# Patient Record
Sex: Female | Born: 1935 | Race: White | Hispanic: No | Marital: Married | Smoking: Never smoker
Health system: Southern US, Community
[De-identification: ages and names within clinical notes are randomized; demographics above are authoritative.]

## PROBLEM LIST (undated history)

## (undated) DIAGNOSIS — H269 Unspecified cataract: Secondary | ICD-10-CM

## (undated) DIAGNOSIS — C801 Malignant (primary) neoplasm, unspecified: Secondary | ICD-10-CM

## (undated) DIAGNOSIS — E785 Hyperlipidemia, unspecified: Secondary | ICD-10-CM

## (undated) DIAGNOSIS — Z860100 Personal history of colon polyps, unspecified: Secondary | ICD-10-CM

## (undated) DIAGNOSIS — M810 Age-related osteoporosis without current pathological fracture: Secondary | ICD-10-CM

## (undated) HISTORY — PX: EYE SURGERY: SHX253

## (undated) HISTORY — DX: Hyperlipidemia, unspecified: E78.5

## (undated) HISTORY — DX: Personal history of colonic polyps: Z86.010

## (undated) HISTORY — DX: Personal history of colon polyps, unspecified: Z86.0100

## (undated) HISTORY — PX: POLYPECTOMY: SHX149

## (undated) HISTORY — PX: COLONOSCOPY: SHX174

## (undated) HISTORY — DX: Age-related osteoporosis without current pathological fracture: M81.0

## (undated) HISTORY — DX: Unspecified cataract: H26.9

---

## 1998-08-01 ENCOUNTER — Other Ambulatory Visit: Admission: RE | Admit: 1998-08-01 | Discharge: 1998-08-01 | Payer: Self-pay | Admitting: Internal Medicine

## 1999-10-13 ENCOUNTER — Other Ambulatory Visit: Admission: RE | Admit: 1999-10-13 | Discharge: 1999-10-13 | Payer: Self-pay | Admitting: Internal Medicine

## 2000-01-12 ENCOUNTER — Other Ambulatory Visit: Admission: RE | Admit: 2000-01-12 | Discharge: 2000-01-12 | Payer: Self-pay | Admitting: Gastroenterology

## 2000-01-12 ENCOUNTER — Encounter (INDEPENDENT_AMBULATORY_CARE_PROVIDER_SITE_OTHER): Payer: Self-pay | Admitting: Specialist

## 2001-02-03 ENCOUNTER — Other Ambulatory Visit: Admission: RE | Admit: 2001-02-03 | Discharge: 2001-02-03 | Payer: Self-pay | Admitting: *Deleted

## 2002-02-13 ENCOUNTER — Other Ambulatory Visit: Admission: RE | Admit: 2002-02-13 | Discharge: 2002-02-13 | Payer: Self-pay | Admitting: Internal Medicine

## 2003-03-12 ENCOUNTER — Other Ambulatory Visit: Admission: RE | Admit: 2003-03-12 | Discharge: 2003-03-12 | Payer: Self-pay | Admitting: Internal Medicine

## 2003-04-02 ENCOUNTER — Ambulatory Visit (HOSPITAL_COMMUNITY): Admission: RE | Admit: 2003-04-02 | Discharge: 2003-04-02 | Payer: Self-pay | Admitting: Cardiology

## 2003-04-02 IMAGING — NM NM MYOCAR PERF EJECTION FRACTION
8 series · 48 of 48 positions shown · non-contrast
Comparison: None.

CLINICAL DATA: 68-year-old female with chest pain.
 MYOCARDIAL PERFUSION WITH SPECT ? 04/02/03
TECHNIQUE: Iechnetium-TTm sestamibi 10.0 mCi was injected at rest.  After exercise stress, an additional 30.0 mCi dose of the same radiopharmaceutical was administered. The patient achieved greater than 85% of the maximum predicted heart rate for age prior to injection of the stress dose.

[Series 1: cr cardiolite low dose · 6.8mm · 6.79mm/px · 6 of 17 frames shown (1 of 8)]
[frame 2/17]
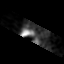
[frame 4/17]
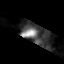
[frame 7/17]
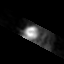
[frame 10/17]
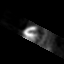
[frame 13/17]
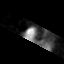
[frame 16/17]
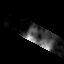

[Series 1: cr cardiolite low dose · 6.8mm · 6.79mm/px · 6 of 17 frames shown (2 of 8)]
[frame 2/17]
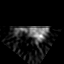
[frame 4/17]
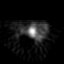
[frame 7/17]
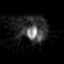
[frame 10/17]
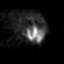
[frame 13/17]
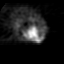
[frame 16/17]
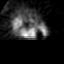

[Series 1: cr cardiolite low dose · 6.8mm · 6.79mm/px · 6 of 17 frames shown (3 of 8)]
[frame 2/17]
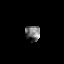
[frame 4/17]
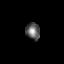
[frame 7/17]
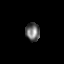
[frame 10/17]
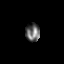
[frame 13/17]
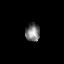
[frame 16/17]
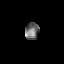

[Series 1: cr cardiolite low dose · 6.8mm · 6.79mm/px · 6 of 21 frames shown (4 of 8)]
[frame 2/21]
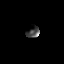
[frame 6/21]
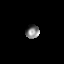
[frame 9/21]
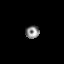
[frame 13/21]
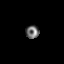
[frame 16/21]
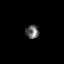
[frame 20/21]
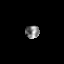

[Series 1: cr cardiolite low dose · 6.8mm · 6.79mm/px · 6 of 17 frames shown (5 of 8)]
[frame 2/17]
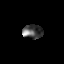
[frame 4/17]
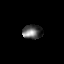
[frame 7/17]
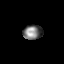
[frame 10/17]
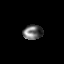
[frame 13/17]
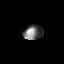
[frame 16/17]
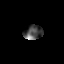

[Series 1: cr cardiolite low dose · 6.79mm/px · 6 of 64 frames shown (6 of 8)]
[frame 6/64]
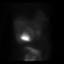
[frame 16/64]
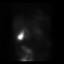
[frame 27/64]
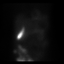
[frame 38/64]
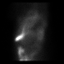
[frame 48/64]
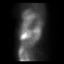
[frame 59/64]
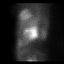

[Series 1: cr cardiolite low dose · 6.79mm/px · 6 of 64 frames shown (7 of 8)]
[frame 6/64]
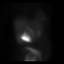
[frame 16/64]
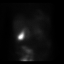
[frame 27/64]
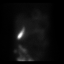
[frame 38/64]
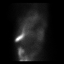
[frame 48/64]
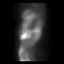
[frame 59/64]
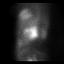

[Series 1: cr cardiolite low dose · 6.8mm · 6.79mm/px · 6 of 21 frames shown (8 of 8)]
[frame 2/21]
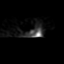
[frame 6/21]
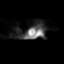
[frame 9/21]
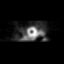
[frame 13/21]
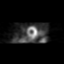
[frame 16/21]
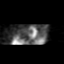
[frame 20/21]
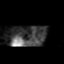

[48 of 48 positions shown; findings below may reference images not displayed]

FINDINGS: SPECT images show no fixed or reversible perfusion defects within the left ventricular myocardium.
 NUCLEAR MEDICINE WALL MOTION STUDY
 Images obtained with cardiac gating and displayed with surface rendering show no segmental wall motion abnormality.  
 NUCLEAR MEDICINE EJECTION FRACTION STUDY
 The left ventricular end-diastolic volume is 71 cc.  The left ventricular end-systolic volume is 23 cc.  The derived left ventricular ejection fraction is 67%.
IMPRESSION: No evidence for left ventricular myocardial infarct or ischemia.
 Normal left ventricular ejection fraction.

## 2004-04-16 ENCOUNTER — Other Ambulatory Visit: Admission: RE | Admit: 2004-04-16 | Discharge: 2004-04-16 | Payer: Self-pay | Admitting: Internal Medicine

## 2004-06-22 ENCOUNTER — Ambulatory Visit: Payer: Self-pay | Admitting: Gastroenterology

## 2004-06-25 ENCOUNTER — Ambulatory Visit: Payer: Self-pay | Admitting: Gastroenterology

## 2004-07-02 ENCOUNTER — Ambulatory Visit: Payer: Self-pay | Admitting: Gastroenterology

## 2004-08-07 ENCOUNTER — Ambulatory Visit: Payer: Self-pay | Admitting: Gastroenterology

## 2004-09-28 ENCOUNTER — Ambulatory Visit: Payer: Self-pay | Admitting: Gastroenterology

## 2005-05-10 ENCOUNTER — Other Ambulatory Visit: Admission: RE | Admit: 2005-05-10 | Discharge: 2005-05-10 | Payer: Self-pay | Admitting: Internal Medicine

## 2006-03-28 ENCOUNTER — Ambulatory Visit: Payer: Self-pay | Admitting: Emergency Medicine

## 2006-03-30 ENCOUNTER — Ambulatory Visit: Payer: Self-pay | Admitting: *Deleted

## 2006-03-30 IMAGING — CT CT CHEST W/ CM
2 of 5 series · 13 of 36 positions shown, 16 images · IV contrast (APPLIED)
Comparison: none

HISTORY: Chest discomfort, cough, congestion, right middle lobe nodule

[Series 2: chest_routine 5.0 b40f st · axial · 0.61mm/px · z∈[-291,-41]mm · 10 of 62 slices shown, 13 images]
[im 6/62  mediastinal]
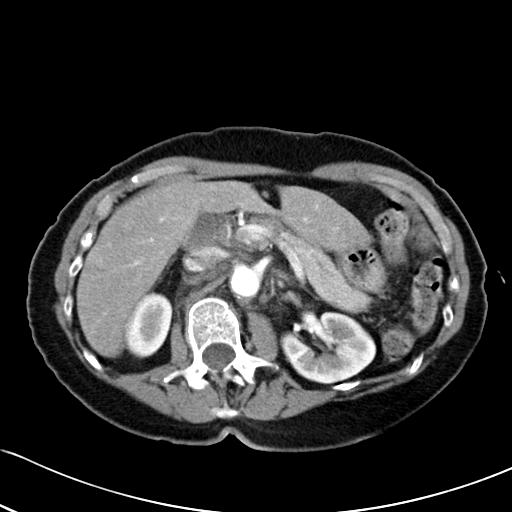
[im 6/62  lung]
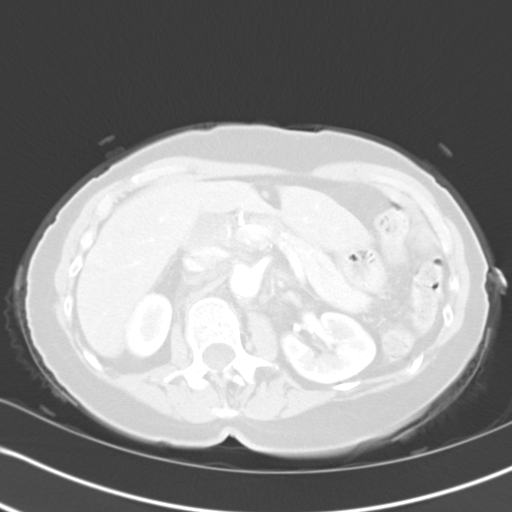
[im 12/62  lung]
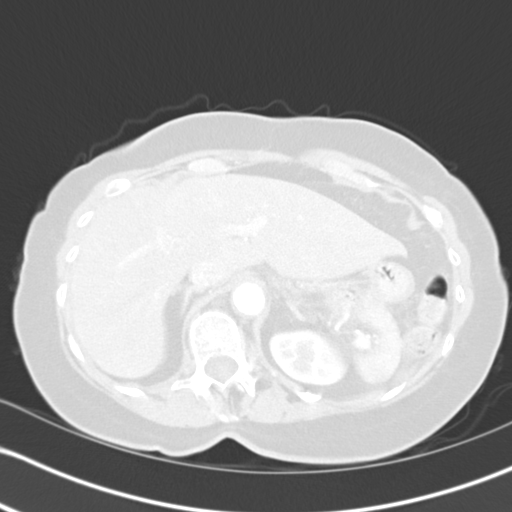
[im 17/62  lung]
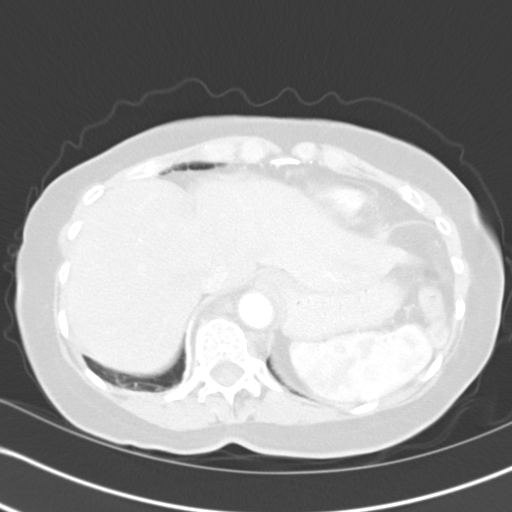
[im 23/62  lung]
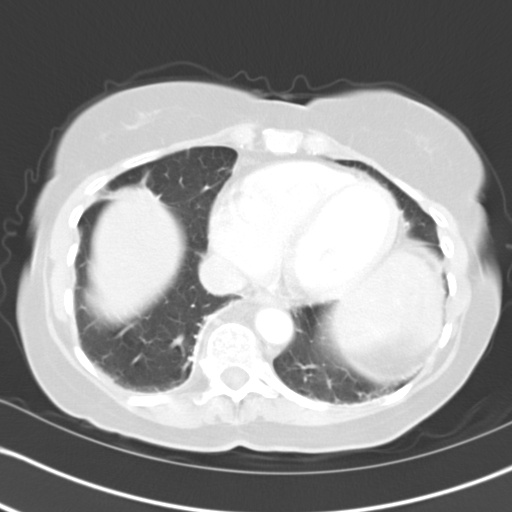
[im 28/62  mediastinal]
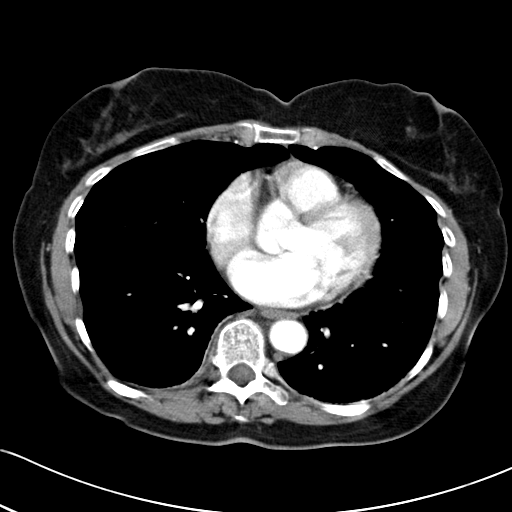
[im 28/62  lung]
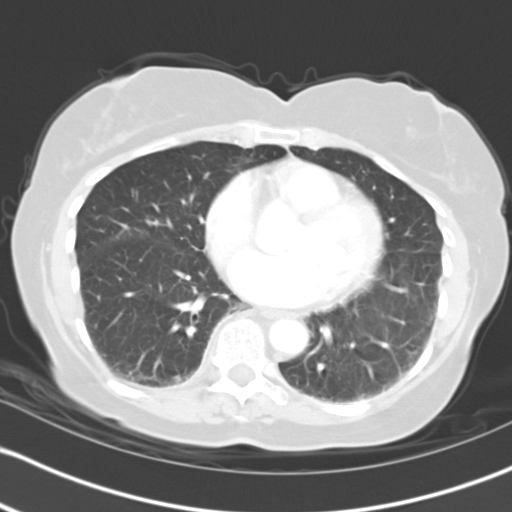
[im 34/62  lung]
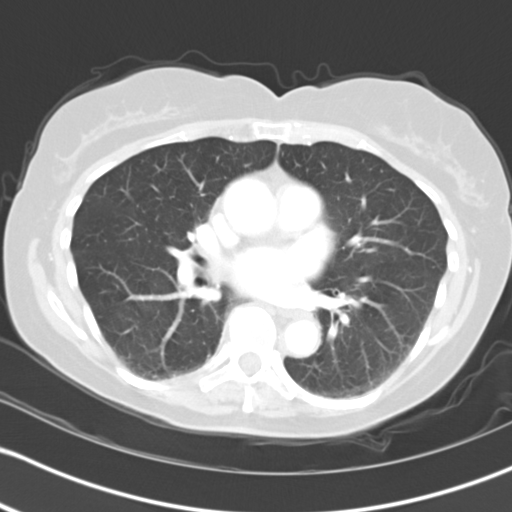
[im 39/62  lung]
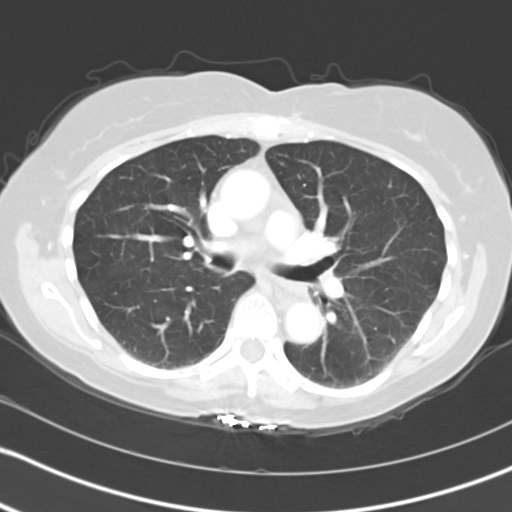
[im 45/62  lung]
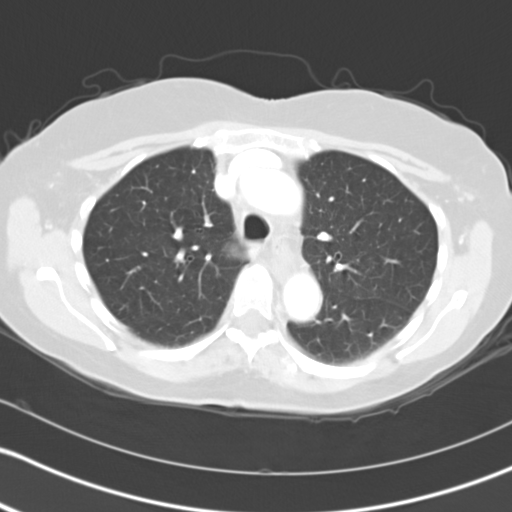
[im 50/62  mediastinal]
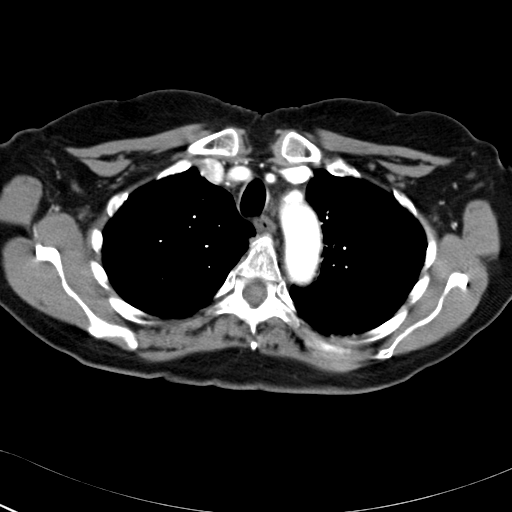
[im 50/62  lung]
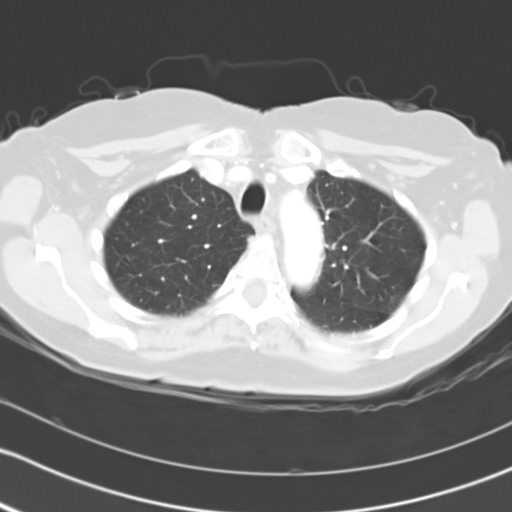
[im 56/62  lung]
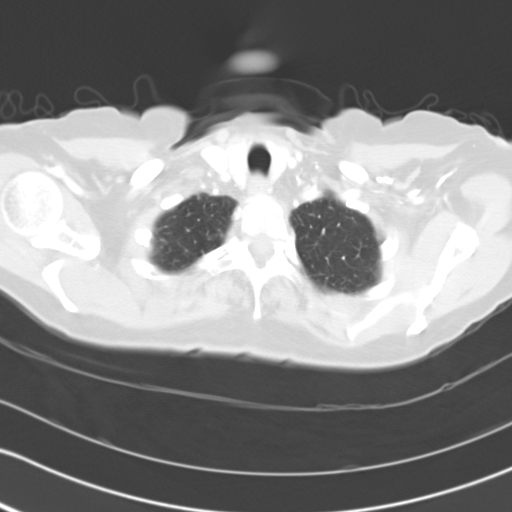

[Series 602: <mpr range> · coronal · 0.61mm/px · 3 of 39 slices shown]
[im 8/39  lung]
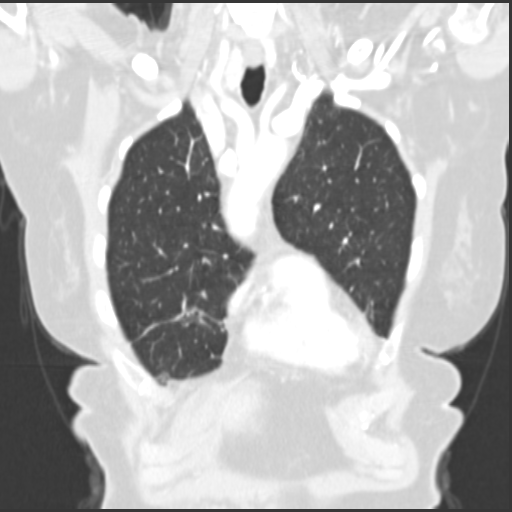
[im 16/39  lung]
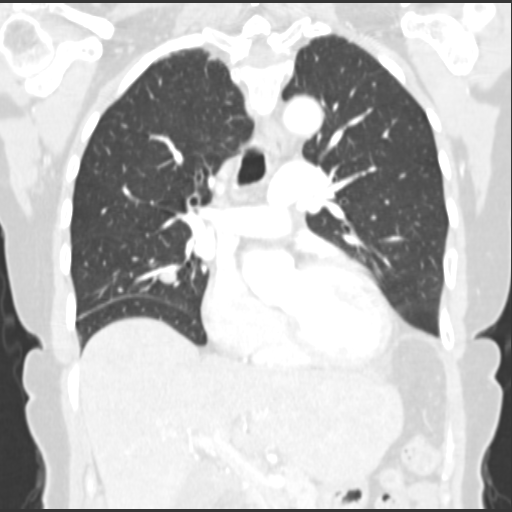
[im 23/39  lung]
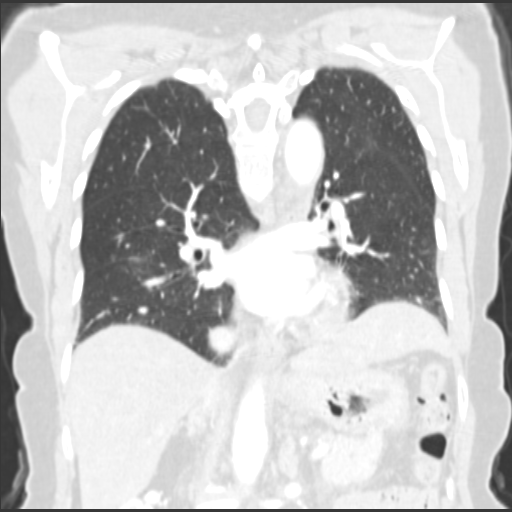

[13 of 36 positions shown; findings below may reference images not displayed]

CT CHEST WITH CONTRAST:

Multidetector helical CT chest performed following 80 cc Umnipaque-4KK.
Sagittal and coronal images reconstructed from axial data set.
Correlation made with outside radiographs 01/31/2006.

Thoracic vascular structures patent.
No mediastinal, hilar, or axillary adenopathy.
Upper normal sized retrocrural lymph nodes noted, 11 x 6 mm image 45 and 11 x 8
mm image 41.
Enlarged peripancreatic lymph node in right upper quadrant 1.9 x 1.3 cm image
58.
Lobulated soft tissue nodule right middle lobe, 12 x 12 x 9 mm, image 33,
noncalcified.
Malignancy not excluded.
Minimal residual atelectasis right middle lobe and right lower lobe.
Lungs otherwise clear.
No definite endobronchial lesion.
Bones unremarkable.
IMPRESSION: 12 x 12 x 9 mm diameter right middle lobe nodule, cannot exclude lung
malignancy, recommend tissue diagnosis.
Minimal residual atelectasis in right middle and right lower lobe.
Single mildly enlarged peripancreatic lymph node with a few upper normal size
retrocrural nodes, can assess significance on followup exam.

## 2006-04-05 ENCOUNTER — Ambulatory Visit (HOSPITAL_COMMUNITY): Admission: RE | Admit: 2006-04-05 | Discharge: 2006-04-05 | Payer: Self-pay | Admitting: Thoracic Surgery

## 2006-04-05 IMAGING — CT NM PET TUM IMG SKULL BASE T - THIGH
3 series · 25 of 25 positions shown · IV contrast (350 OM)
Comparison: Comparison is made with chest CT of 03/30/06.

CLINICAL DATA: Right middle lobe lung nodule. No history of smoking. No known primary malignancy. 
FDG PET-CT TUMOR IMAGING (SKULL BASE TO THIGHS):

Fasting Blood Glucose:  82
TECHNIQUE: 14.7 mCi F-18 FDG were administered via left AC injection.  Full ring PET imaging was performed from the skull base through the mid-thighs 55 minutes after injection.  CT data was obtained and used for attenuation correction and anatomic localization only.  (This was not acquired as a diagnostic CT examination.)

[Series 1: pet ac · axial · 3.3mm · 4.69mm/px · z∈[-726,+0]mm · 9 of 223 slices shown]
[im 1/223]
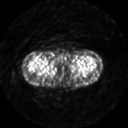
[im 28/223]
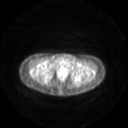
[im 56/223]
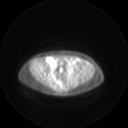
[im 84/223]
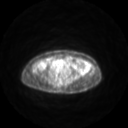
[im 112/223]
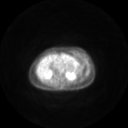
[im 139/223]
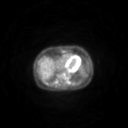
[im 167/223]
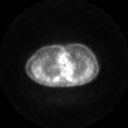
[im 195/223]
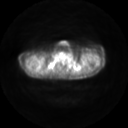
[im 223/223]
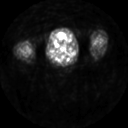

[Series 2: pet nac · axial · 3.3mm · 4.69mm/px · z∈[-726,+0]mm · 8 of 223 slices shown]
[im 1/223]
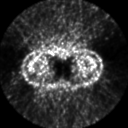
[im 32/223]
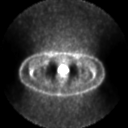
[im 64/223]
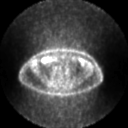
[im 96/223]
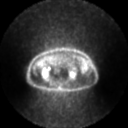
[im 127/223]
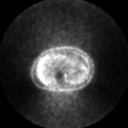
[im 159/223]
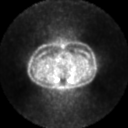
[im 191/223]
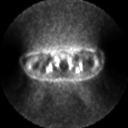
[im 223/223]
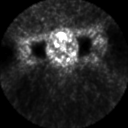

[Series 2: ct images · axial · 3.8mm · 0.98mm/px · z∈[-726,+0]mm · 8 of 223 slices shown]
[im 1/223]
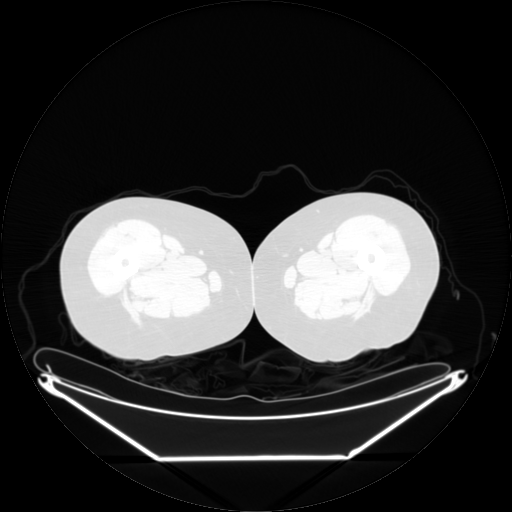
[im 32/223]
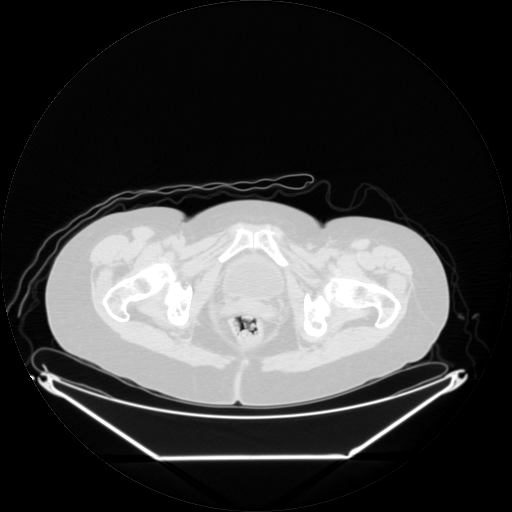
[im 64/223]
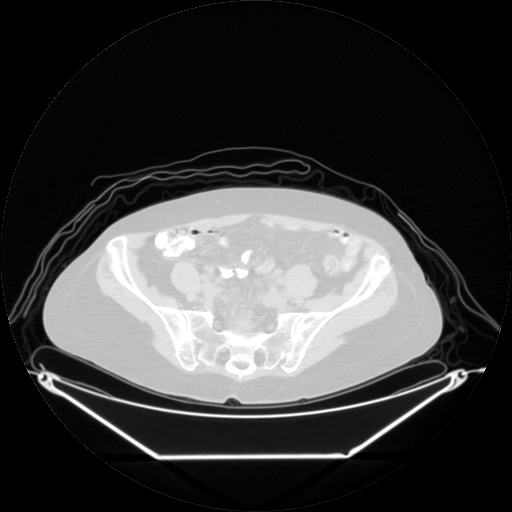
[im 96/223]
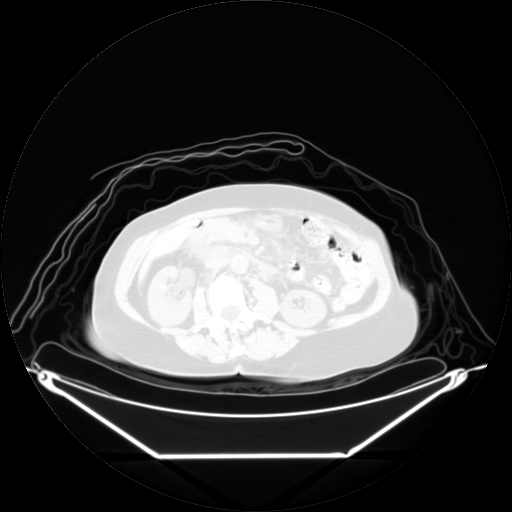
[im 127/223]
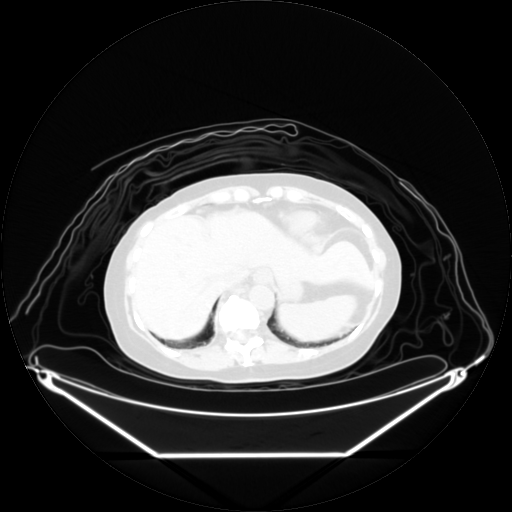
[im 159/223]
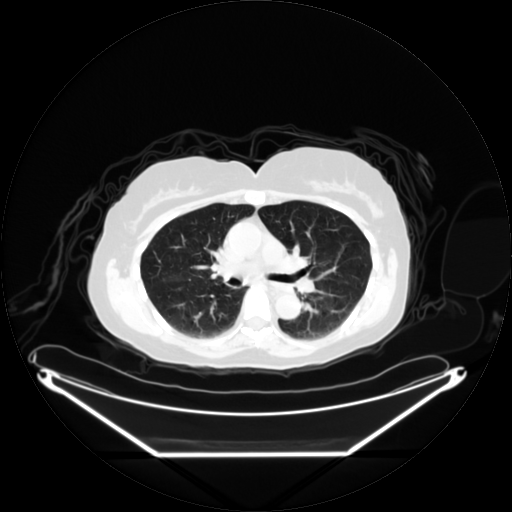
[im 191/223]
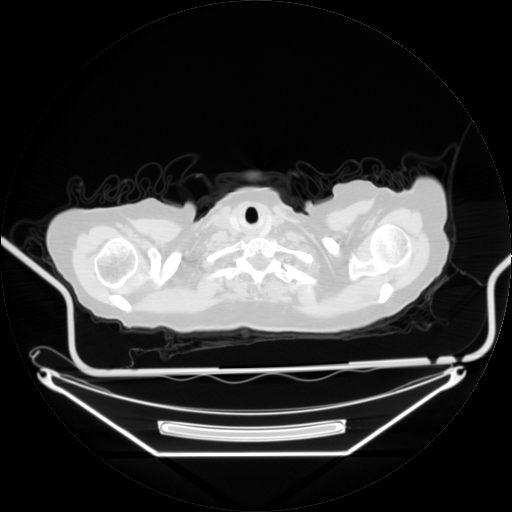
[im 223/223]
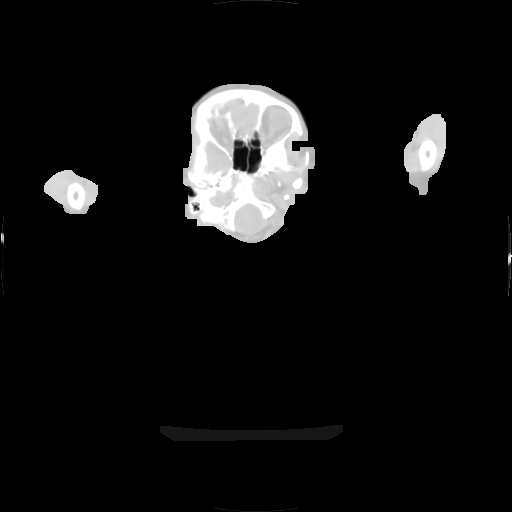

[25 of 25 positions shown; findings below may reference images not displayed]

FINDINGS: Within the left palatine tonsil region, corresponding to an area of possible soft tissue fullness versus underdistention on image 14, there is mild hypermetabolism measuring a maximum of 4.2 gm/mL.  
Throughout the neck and chest, there is extensive ?brown/hypermetabolic fat?.  This limits evaluation for hypermetabolic nodes within the neck and upper chest. 
The right middle lobe lung nodule described on the prior CT demonstrates mild hypermetabolism measuring a maximum of 2.2 gm/mL. This is slightly greater than the mediastinal blood pool.
No other hypermetabolic lung nodules.  No abnormal nodal activity within the chest.  
  In addition, the retrocrural nodes described on prior diagnostic CT are not hypermetabolic.  A focus of hypermetabolism is identified posterior to the left common carotid artery corresponding to image 45 of the CT images.  This was felt to represent focal brown/hypermetabolic fat.  There is a lymph node positioned superior and lateral to this area on image 41, which is felt separate. 
There is no abnormal activity within the abdomen. 
Corresponding to a right inguinal lymph node measuring approximately 1.1 cm on image 204 is mild hypermetabolism measuring maximally 2.5 gm/mL. This lymph node appears to maintain it?s fatty hilum (likely reactive). 
CT images performed for attenuation correction demonstrate normal adrenal glands. There is subtle increased density within the jejunal mesentery (example image 131).  There is also a small amount of free pelvic fluid. There is no evidence of adnexal mass.
IMPRESSION: 1.  Mild hypermetabolism within a right middle lobe lung nodule.  This is just below the standard cutoff for malignancy (SUV of 2.5) . Differential considerations would include an indolent bronchogenic carcinoma such as a low-grade adenocarcinoma or a reactive post-infectious/inflammatory nodule. Consider either tissue sampling or short-term follow-up with chest CT (i.e. 3 months).
2.  No evidence of thoracic or extrathoracic metastasis. 
3.  Somewhat limited exam due to extensive brown fat. 
4.  Left palatine tonsil activity and apparent soft tissue fullness could be physiologic and/or due to underdistention. Consider physical exam correlation. 
5.  A small amount of free fluid within the pelvis with increased density within the jejunal mesentery. No cause is identified.  As indicated, if the patient undergoes follow-up chest CT, abdominal CT should be considered to confirm stability or resolution. 
This case was discussed with Dr. Keerthana prior to this dictation.

## 2006-04-19 DIAGNOSIS — Z902 Acquired absence of lung [part of]: Secondary | ICD-10-CM

## 2006-04-19 HISTORY — DX: Acquired absence of lung (part of): Z90.2

## 2006-04-26 ENCOUNTER — Ambulatory Visit (HOSPITAL_COMMUNITY): Admission: RE | Admit: 2006-04-26 | Discharge: 2006-04-26 | Payer: Self-pay | Admitting: Emergency Medicine

## 2006-04-26 ENCOUNTER — Ambulatory Visit: Payer: Self-pay | Admitting: Emergency Medicine

## 2006-04-26 ENCOUNTER — Encounter (INDEPENDENT_AMBULATORY_CARE_PROVIDER_SITE_OTHER): Payer: Self-pay | Admitting: Specialist

## 2006-04-26 IMAGING — CR DG CHEST 1V PORT
1 series · 1 of 1 positions shown · non-contrast
Comparison: none

CLINICAL DATA: Status-post bronchoscopy for pulmonary nodule.  
 PORTABLE CHEST - 1 VIEW ? 04/26/06 (5756 HOURS):

[view not recorded]
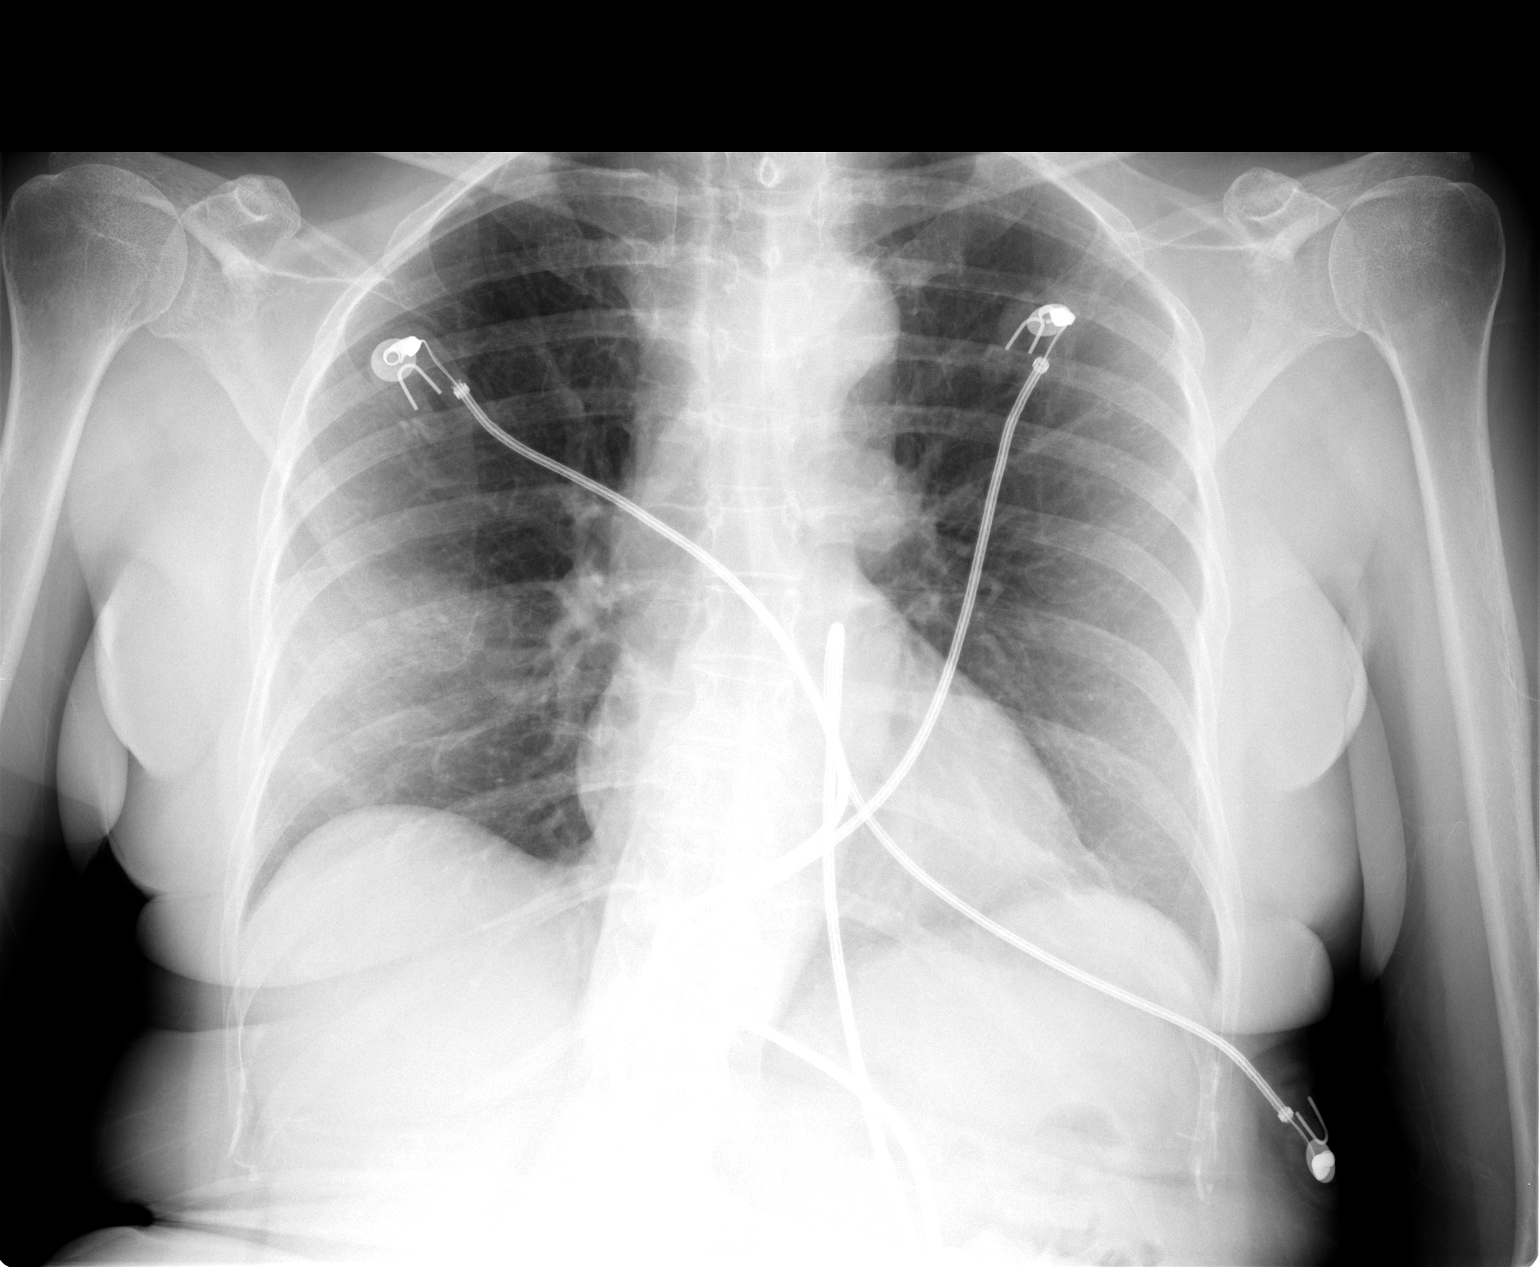

[1 of 1 positions shown; findings below may reference images not displayed]

FINDINGS: No pneumothorax post bronchoscopy.  Faint nodule is noted just inferior and lateral to the right hilum.
IMPRESSION: No pneumothorax post bronchoscopy.

## 2006-05-11 ENCOUNTER — Other Ambulatory Visit: Admission: RE | Admit: 2006-05-11 | Discharge: 2006-05-11 | Payer: Self-pay | Admitting: Internal Medicine

## 2006-05-20 HISTORY — PX: LUNG REMOVAL, PARTIAL: SHX233

## 2006-06-07 IMAGING — CR DG CHEST 2V
2 series · 2 of 2 positions shown · non-contrast
Comparison: 04/26/06.

CLINICAL DATA: Lung lesion. 
 CHEST - 2 VIEW:

[view not recorded (1 of 2)]
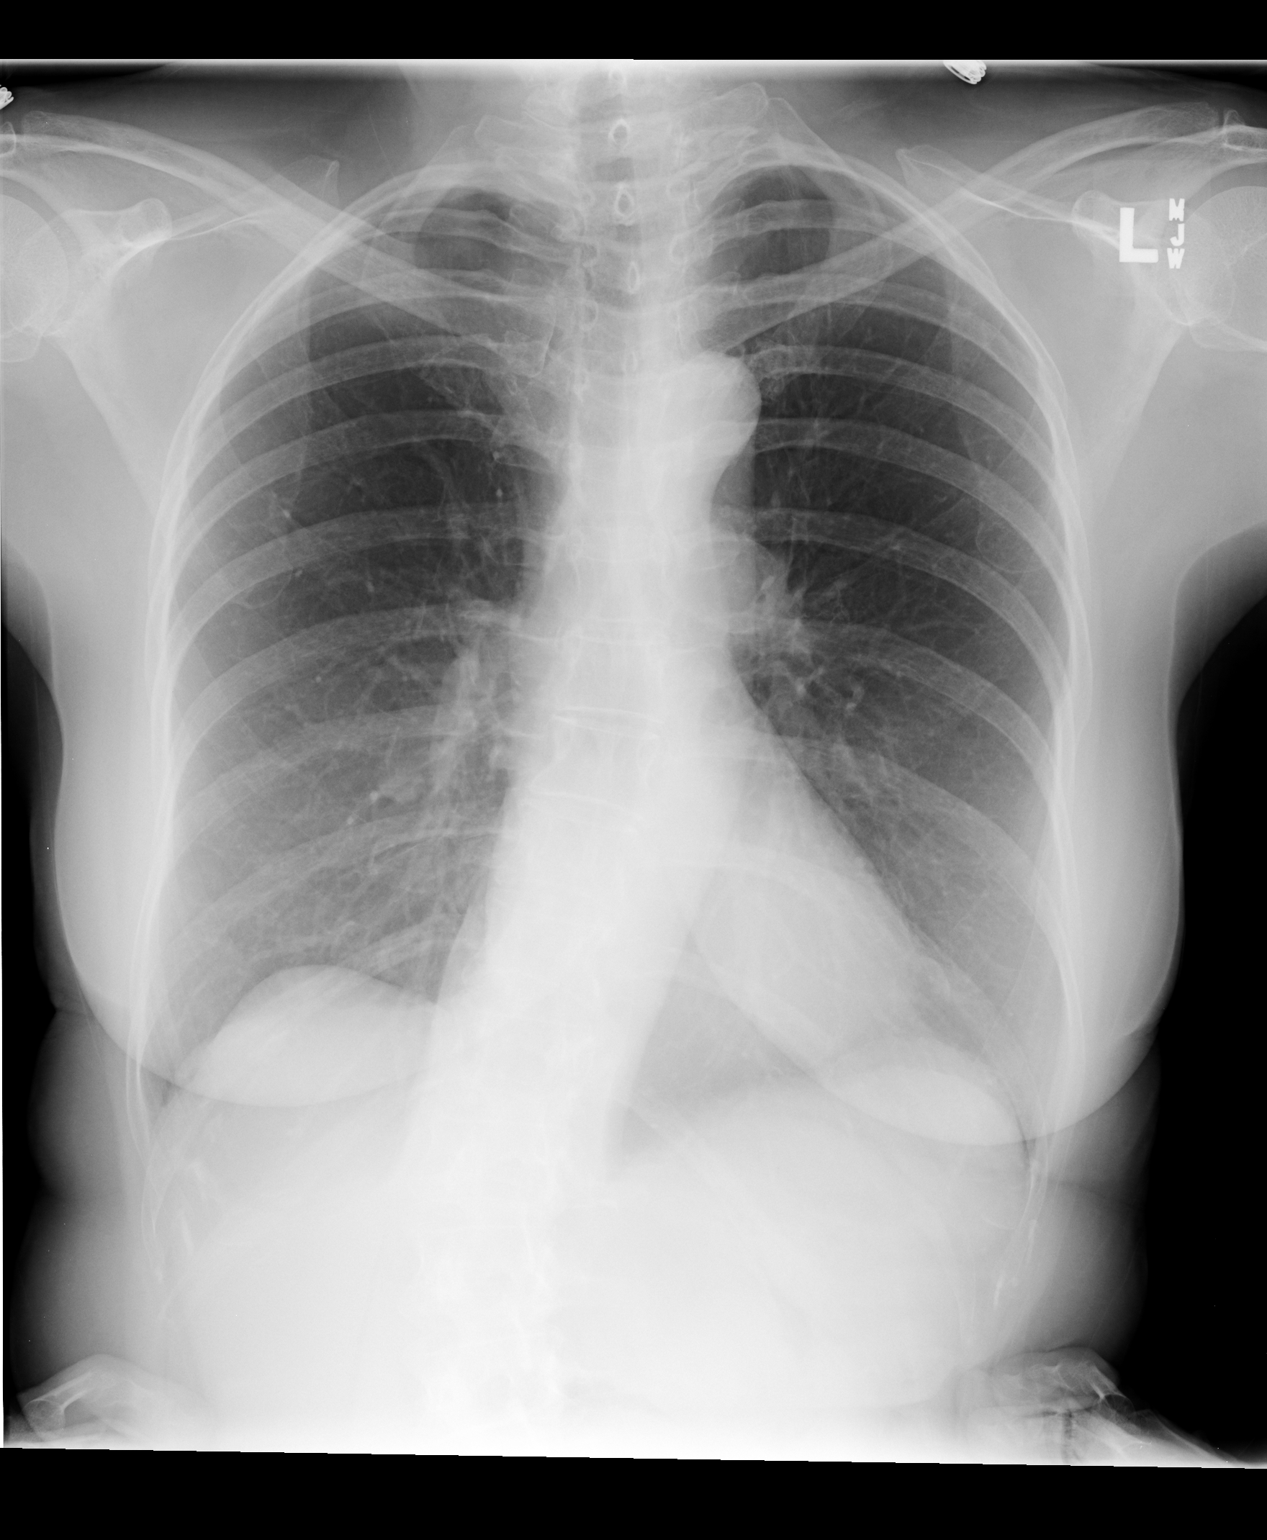

[view not recorded (2 of 2)]
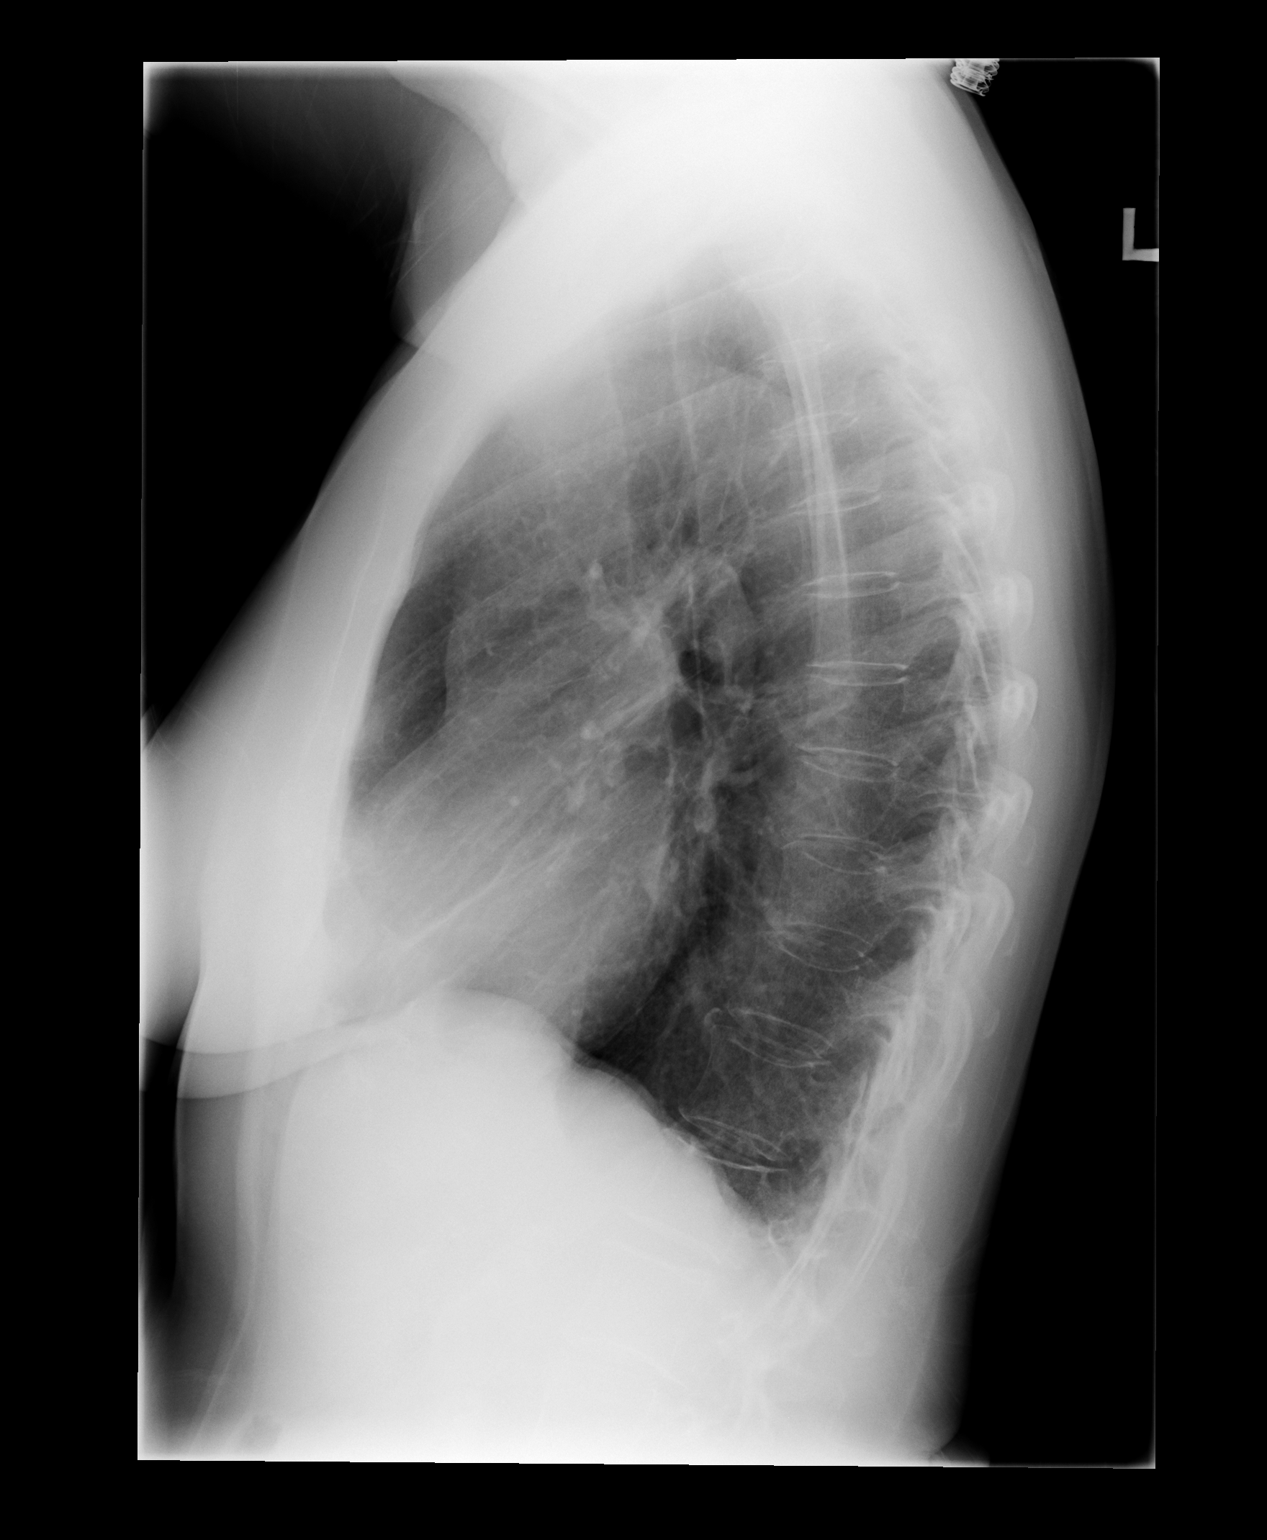

[2 of 2 positions shown; findings below may reference images not displayed]

FINDINGS: The right perihilar pulmonary nodule is again noted measuring approximately 12 mm.  
 No new lung opacities are identified. 
 The heart size is normal. No effusions or edema.
IMPRESSION: No significant change in right pulmonary nodule.

## 2006-06-09 ENCOUNTER — Ambulatory Visit: Payer: Self-pay | Admitting: Thoracic Surgery

## 2006-06-09 ENCOUNTER — Encounter (INDEPENDENT_AMBULATORY_CARE_PROVIDER_SITE_OTHER): Payer: Self-pay | Admitting: Specialist

## 2006-06-09 ENCOUNTER — Inpatient Hospital Stay (HOSPITAL_COMMUNITY): Admission: RE | Admit: 2006-06-09 | Discharge: 2006-06-13 | Payer: Self-pay | Admitting: Thoracic Surgery

## 2006-06-09 IMAGING — CR DG CHEST 1V PORT
1 series · 1 of 1 positions shown · non-contrast
Comparison: Comparison is made to preoperative exam 06/07/06.

CLINICAL DATA: Status-post VATS. Right middle lobectomy. Central line and chest tubes.
 PORTABLE CHEST - 1 VIEW ? 06/09/06 AT 6699 HOURS:

[view not recorded]
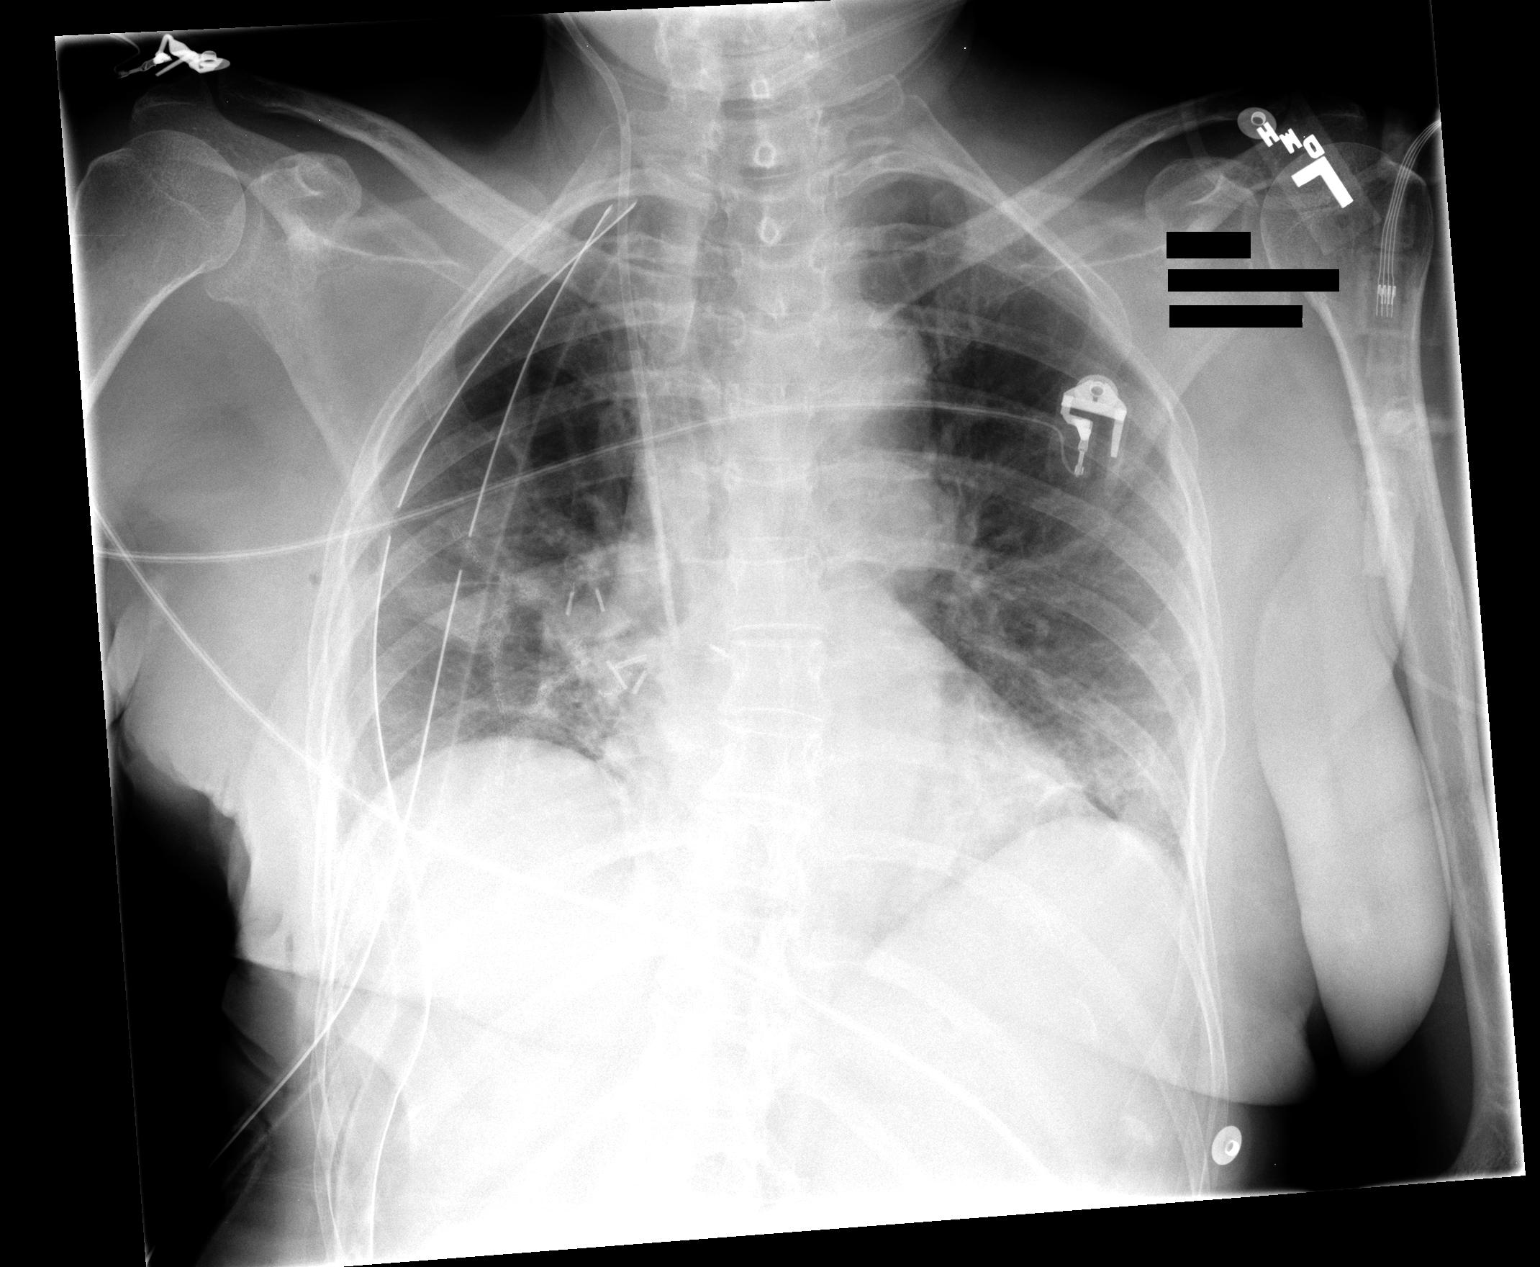

[1 of 1 positions shown; findings below may reference images not displayed]

FINDINGS: Right perihilar surgical clips and staple rows. Two right pleural chest tubes with tips at the apex.  Right jugular CVC is noted with tip in the distal SVC.  Subsegmental atelectasis lower lung zones. No pneumothorax. No airspace opacities.
IMPRESSION: Bilateral subsegmental atelectasis postoperatively. No pneumothorax. Right IJ CVC is in the distal SVC.

## 2006-06-10 IMAGING — CR DG CHEST 1V PORT
1 series · 1 of 1 positions shown · non-contrast
Comparison: 06/09/06.

CLINICAL DATA: 71-year-old female.  Lung lesion status post right thoracotomy. 
PORTABLE CHEST - 1 VIEW:

[view not recorded]
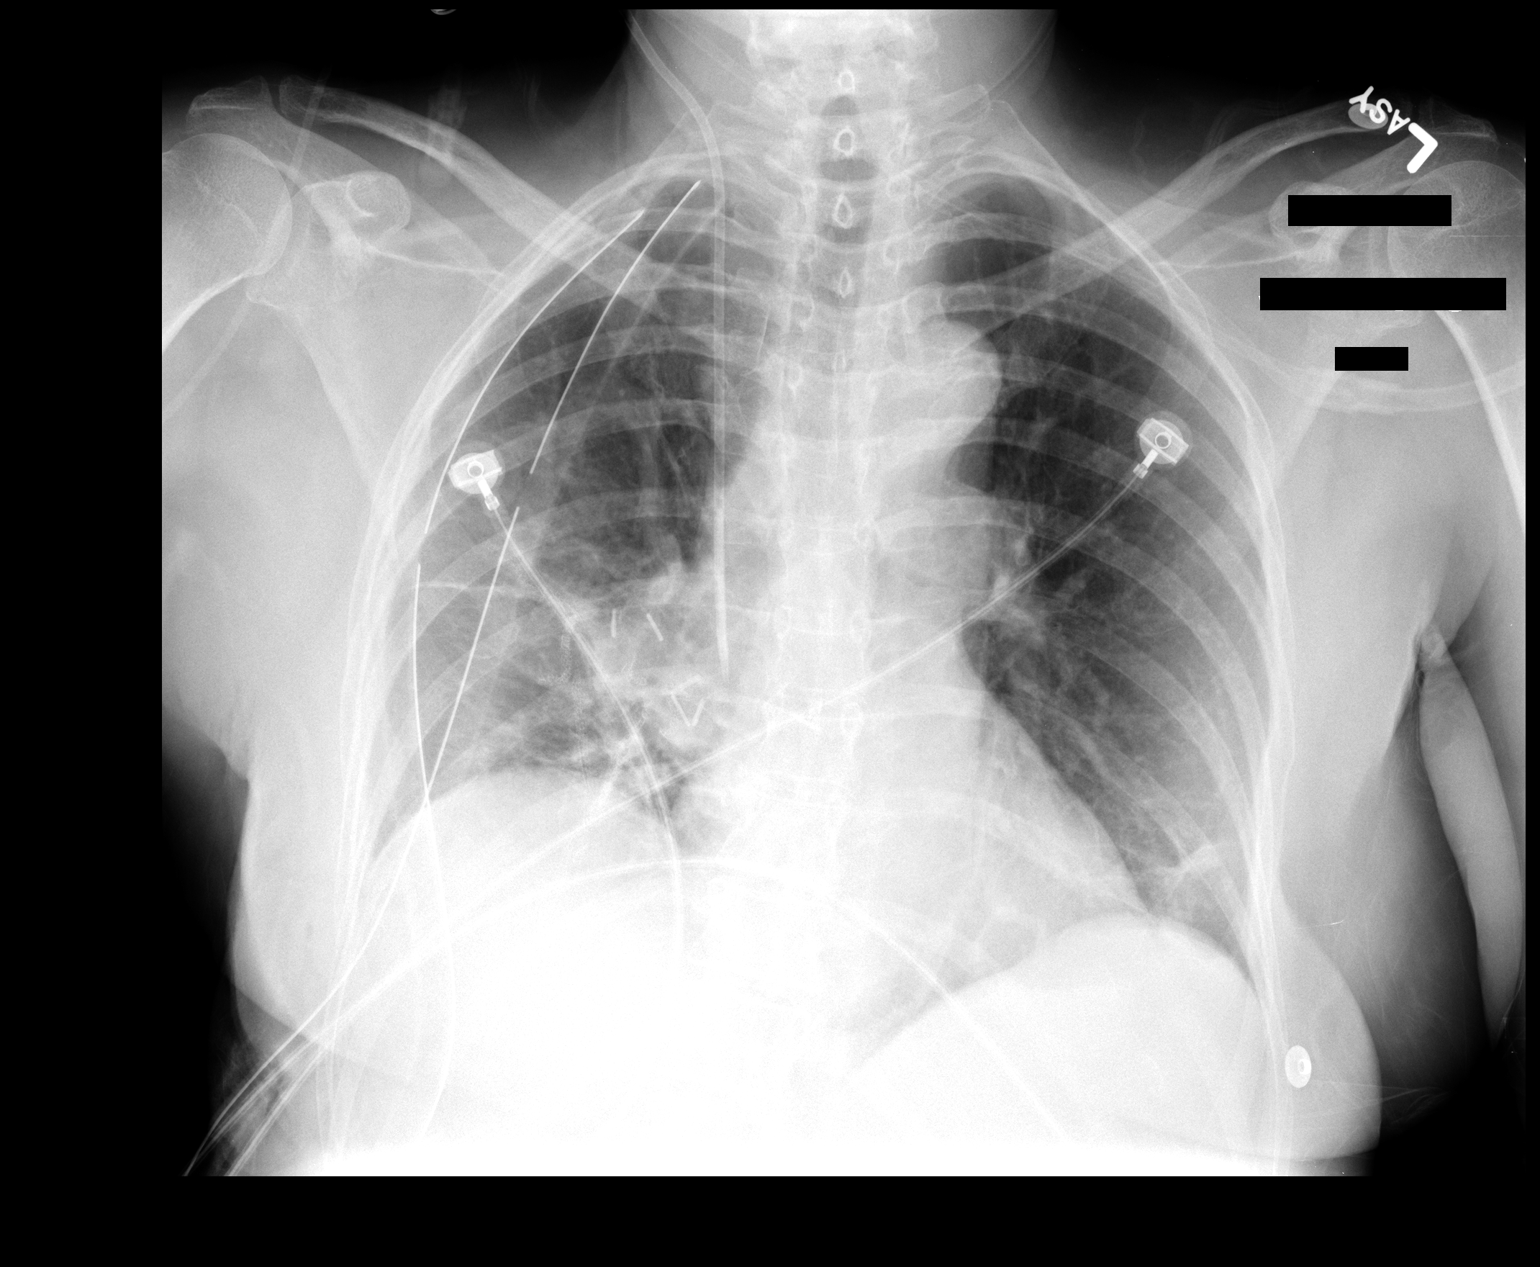

[1 of 1 positions shown; findings below may reference images not displayed]

FINDINGS: Right IJ central line tip is in the SVC/RA junction.  Two right chest tubes remain.  Postoperative changes are noted in the right hilum.  Bibasilar atelectasis is again noted with slight elevation of the right hemidiaphragm.  No visualized pneumothorax.
IMPRESSION: 1.  Status post right thoracotomy.  No pneumothorax.  
2.  Bibasilar atelectasis, worse on the right.

## 2006-06-11 IMAGING — CR DG CHEST 1V PORT
1 series · 1 of 1 positions shown · non-contrast
Comparison: 06/10/06.

CLINICAL DATA: Pneumonia.  Lung lesion.  Post-op.
 PORTABLE CHEST - 1 VIEW - 06/11/06 AT 0999 HOURS:

[view not recorded]
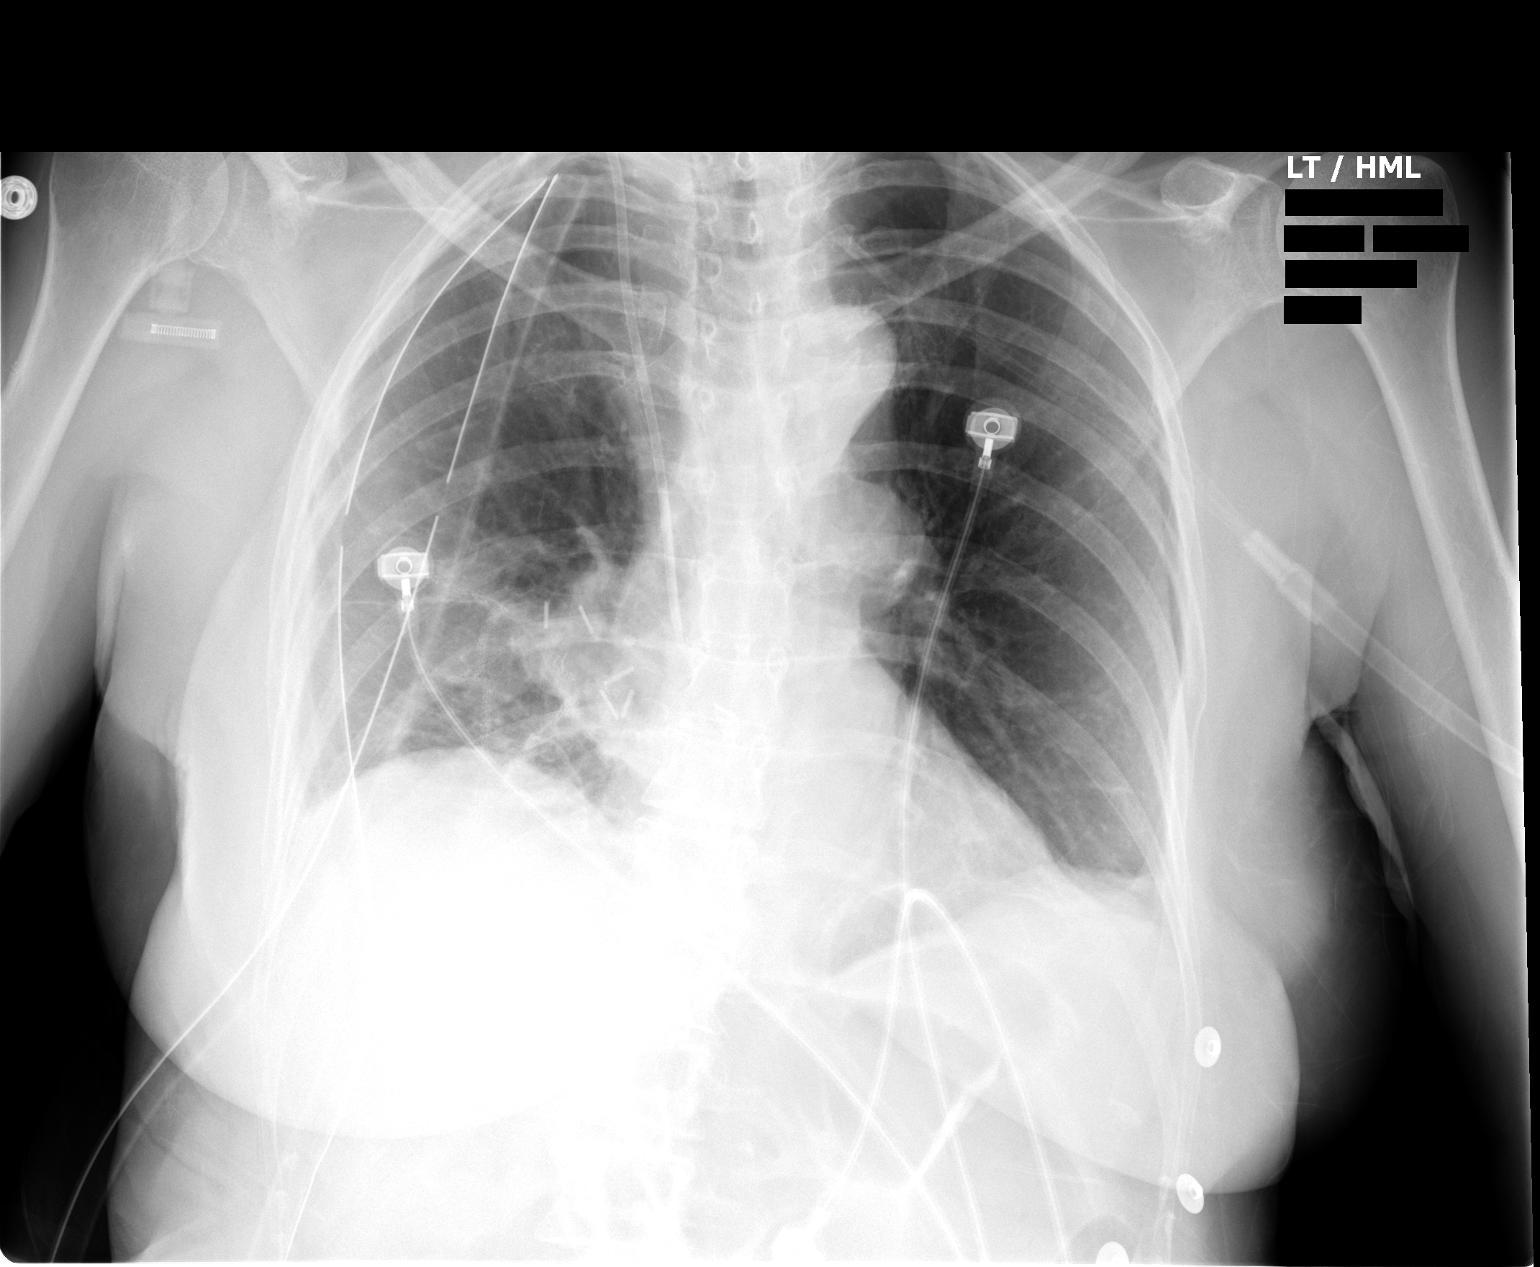

[1 of 1 positions shown; findings below may reference images not displayed]

FINDINGS: Two right chest tubes remain and right basilar atelectasis is present.  Only a tiny right apical pneumothorax appears to be present.  The right central venous catheter tip is in the SVC.
IMPRESSION: Two right chest tubes present with only a tiny right apical pneumothorax present.

## 2006-06-12 IMAGING — CR DG CHEST 1V PORT
1 series · 1 of 1 positions shown · non-contrast
Comparison: 06/11/06.

CLINICAL DATA: Lung lesion. Follow-up. 
 PORTABLE CHEST - 1 VIEW 06/12/06 AT 9294 HOURS:

[view not recorded]
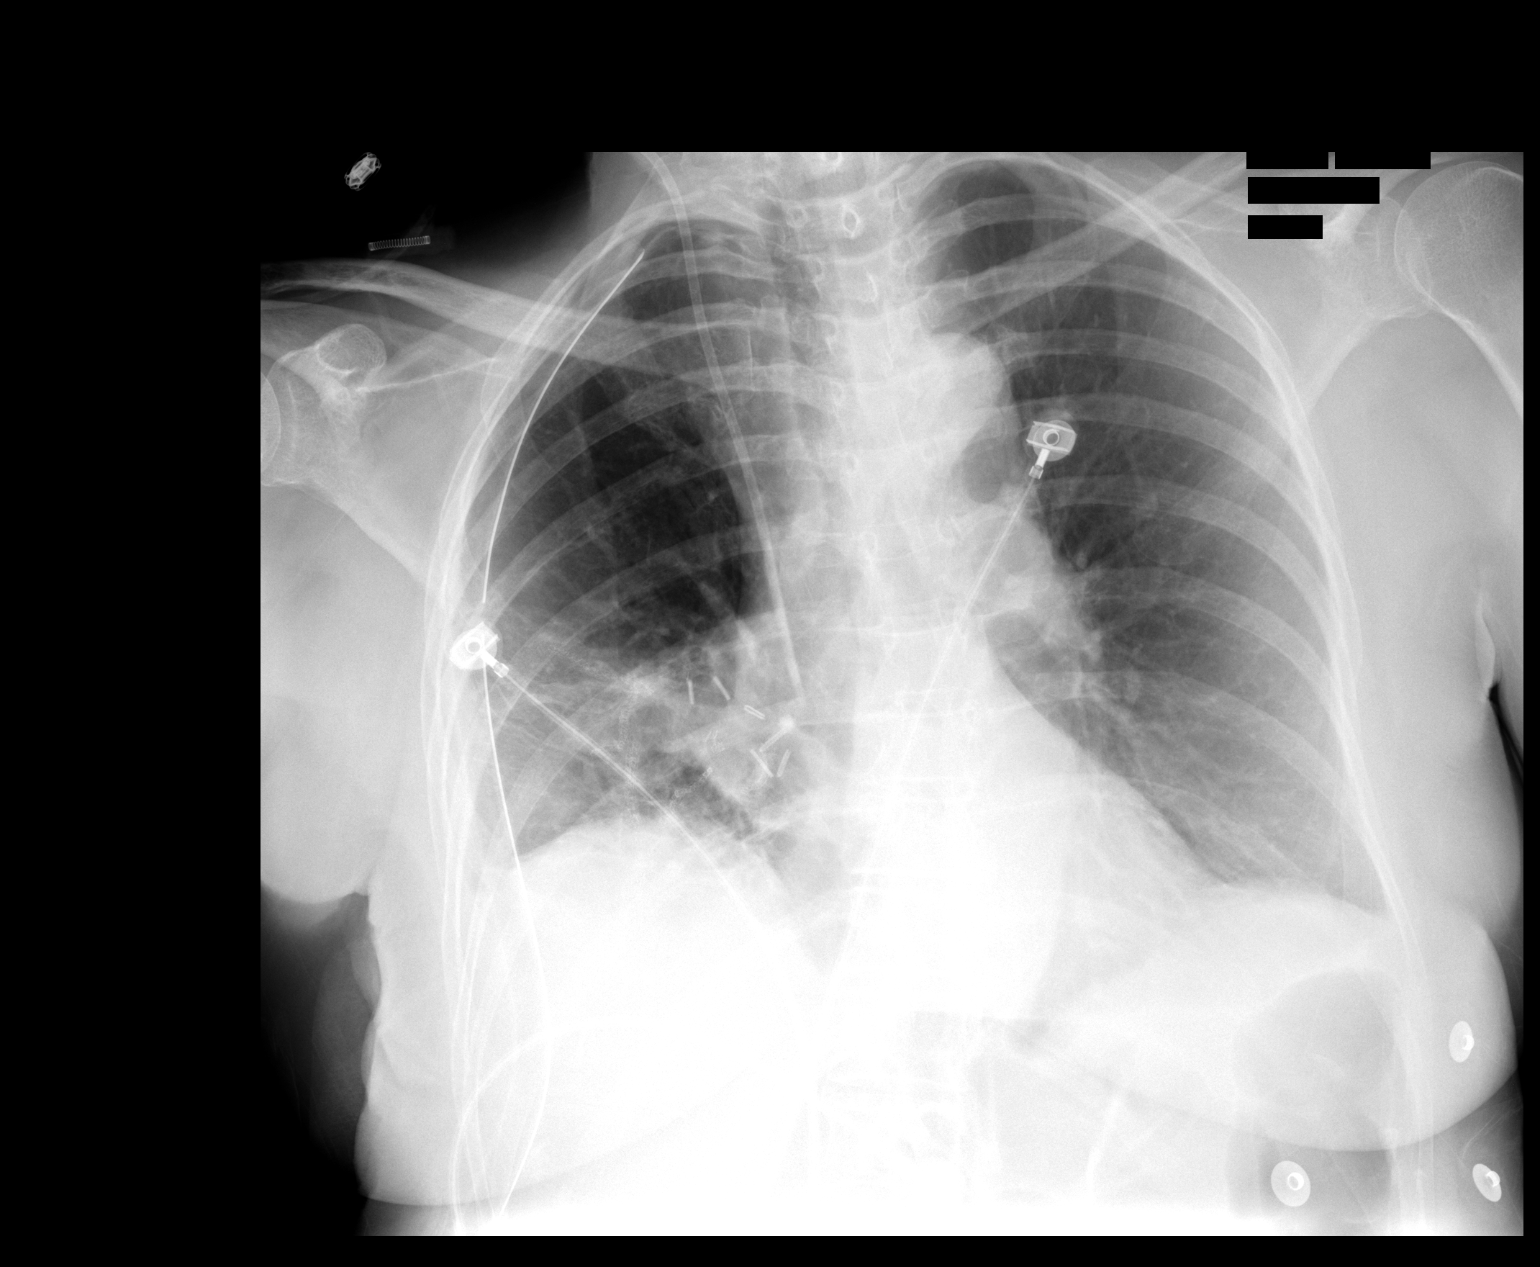

[1 of 1 positions shown; findings below may reference images not displayed]

FINDINGS: One of the two right chest tubes has been removed and there has been no change in the tiny right apical pneumothorax.  Right basilar atelectasis and postop change remains.  Right central venous line is unchanged.
IMPRESSION: One of two right chest tubes removed with no change in tiny right apical pneumothorax.

## 2006-06-12 IMAGING — CR DG CHEST 1V PORT
1 series · 1 of 1 positions shown · non-contrast
Comparison: none

CLINICAL DATA: 71-year-old, lung lesion.  Chest tube removal.
 PORTABLE CHEST:

[view not recorded]
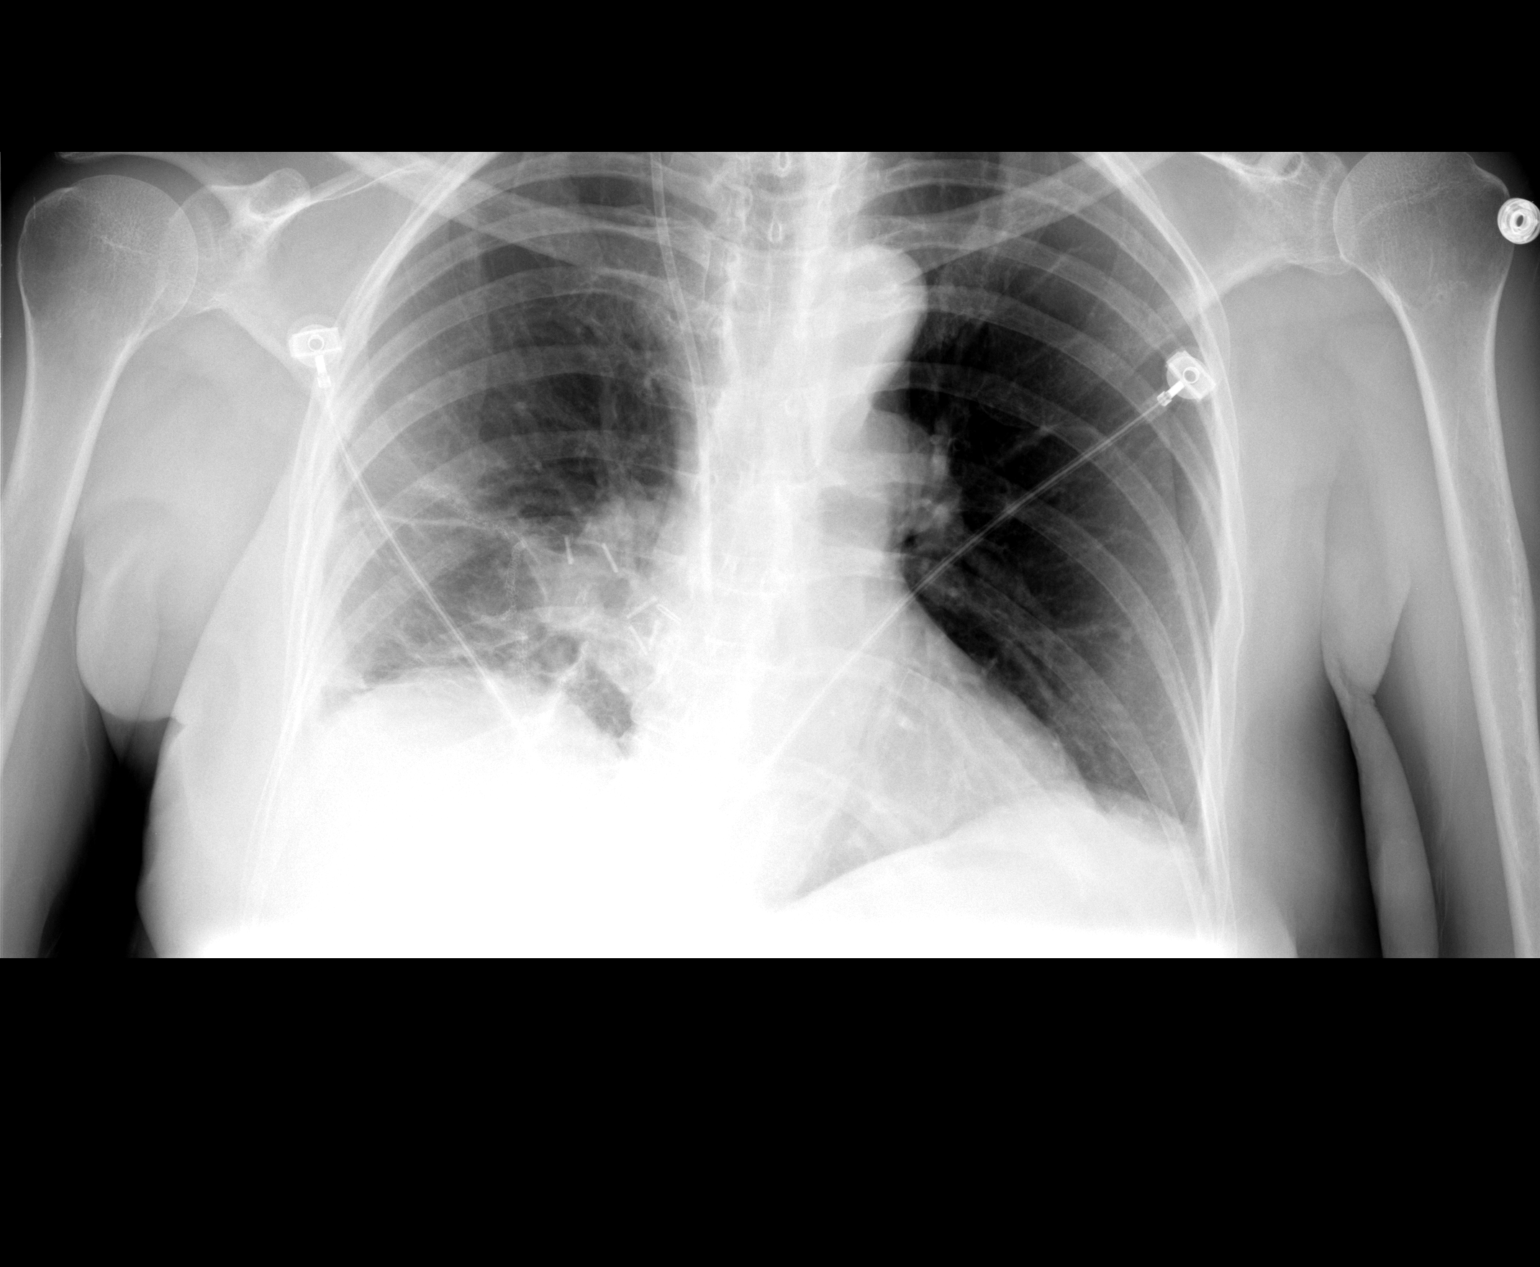

[1 of 1 positions shown; findings below may reference images not displayed]

FINDINGS: Right-sided chest tube has been removed.  No definite pneumothorax is seen.  The right IJ catheter is stable.  Stable postoperative changes in the right lower lobe.  Left lung remains relatively clear.
IMPRESSION: 1.  Removal of the right-sided chest tube.   No definite pneumothorax is seen.  
 2.  Stable postoperative change at the right lung base.

## 2006-06-13 IMAGING — CR DG CHEST 2V
2 series · 2 of 2 positions shown · non-contrast
Comparison: 06/12/06 and CT chest 03/30/06.

CLINICAL DATA: Lung lesion, pneumothorax. Shortness of breath and chest pain.
 CHEST - 2 VIEW:

[w chest pa]
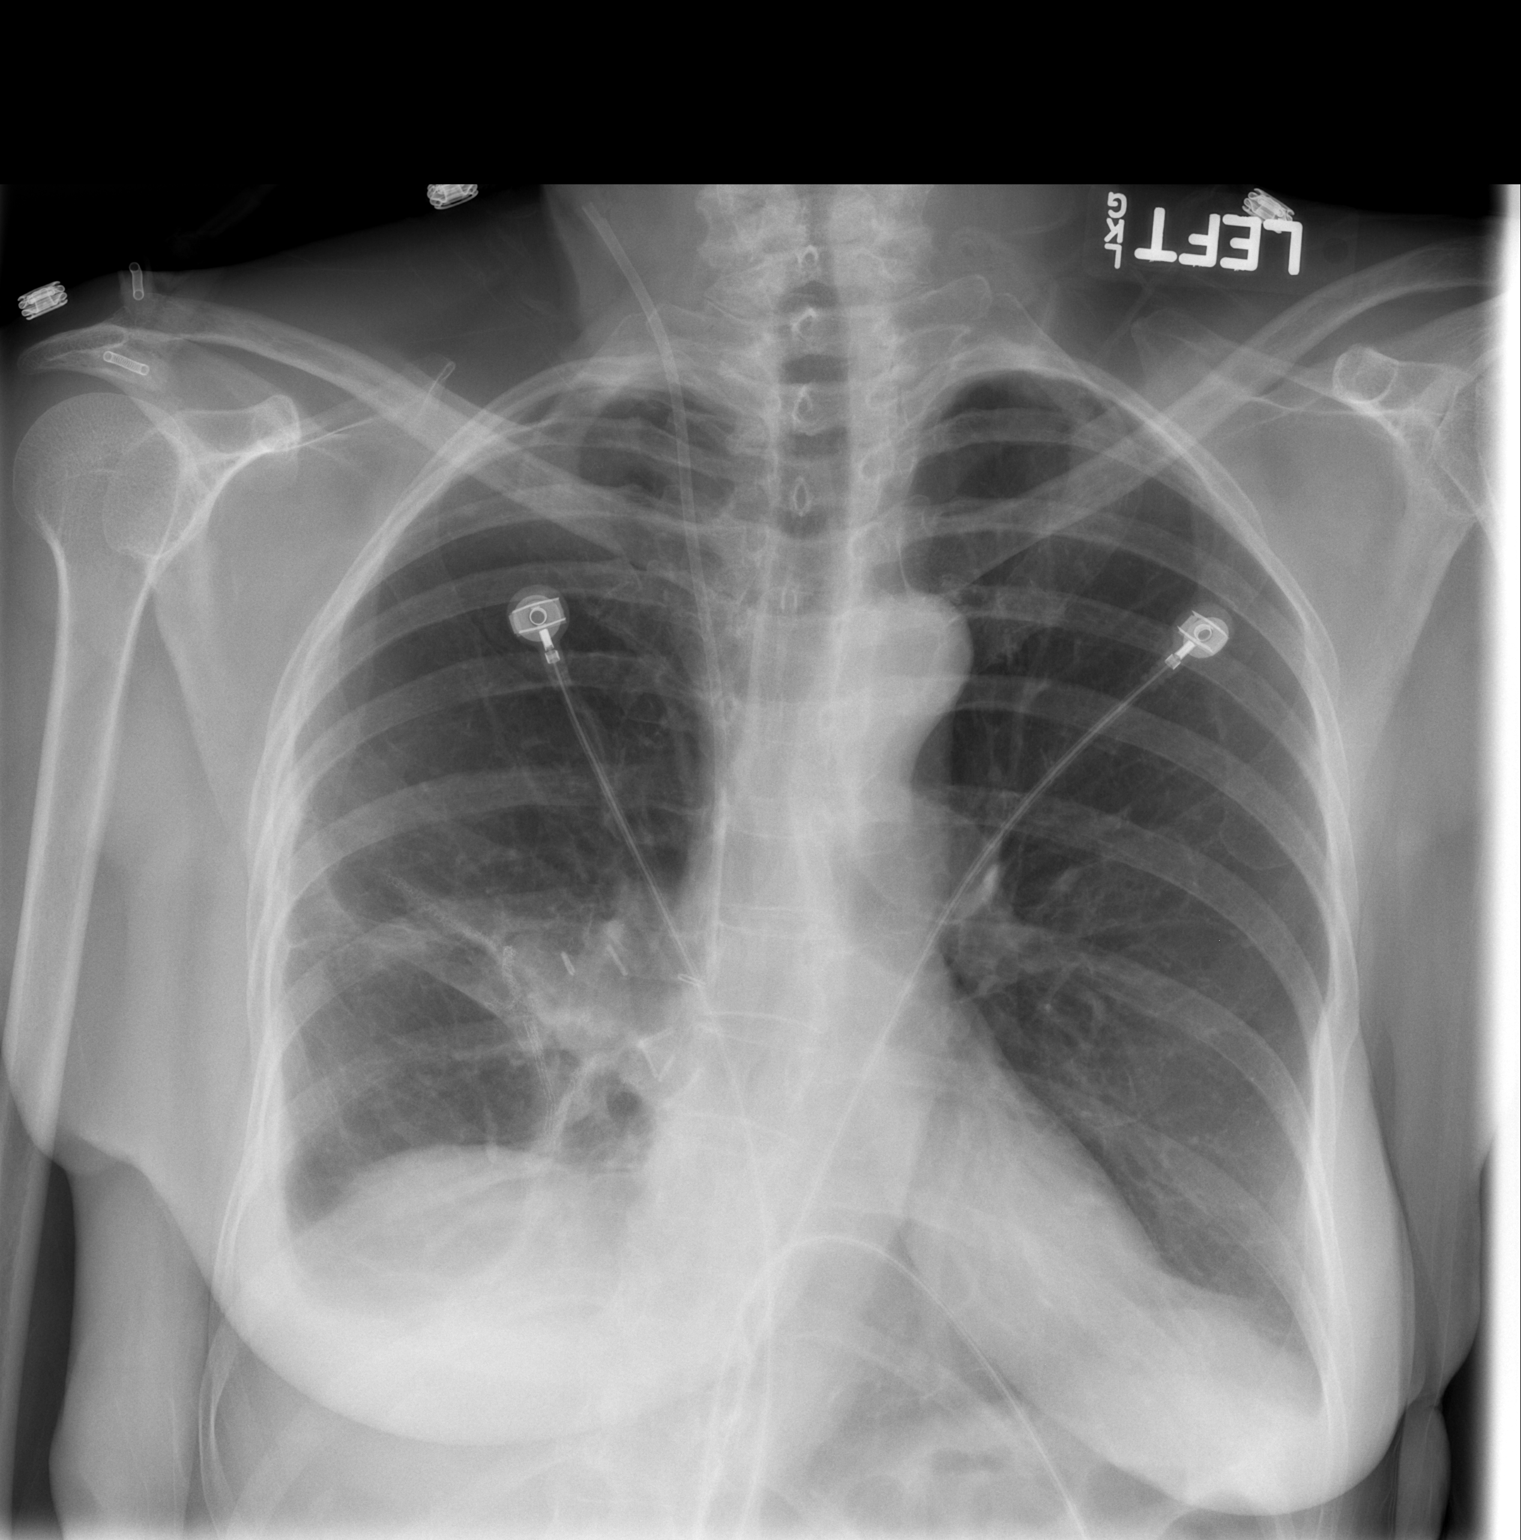

[w chest lat]
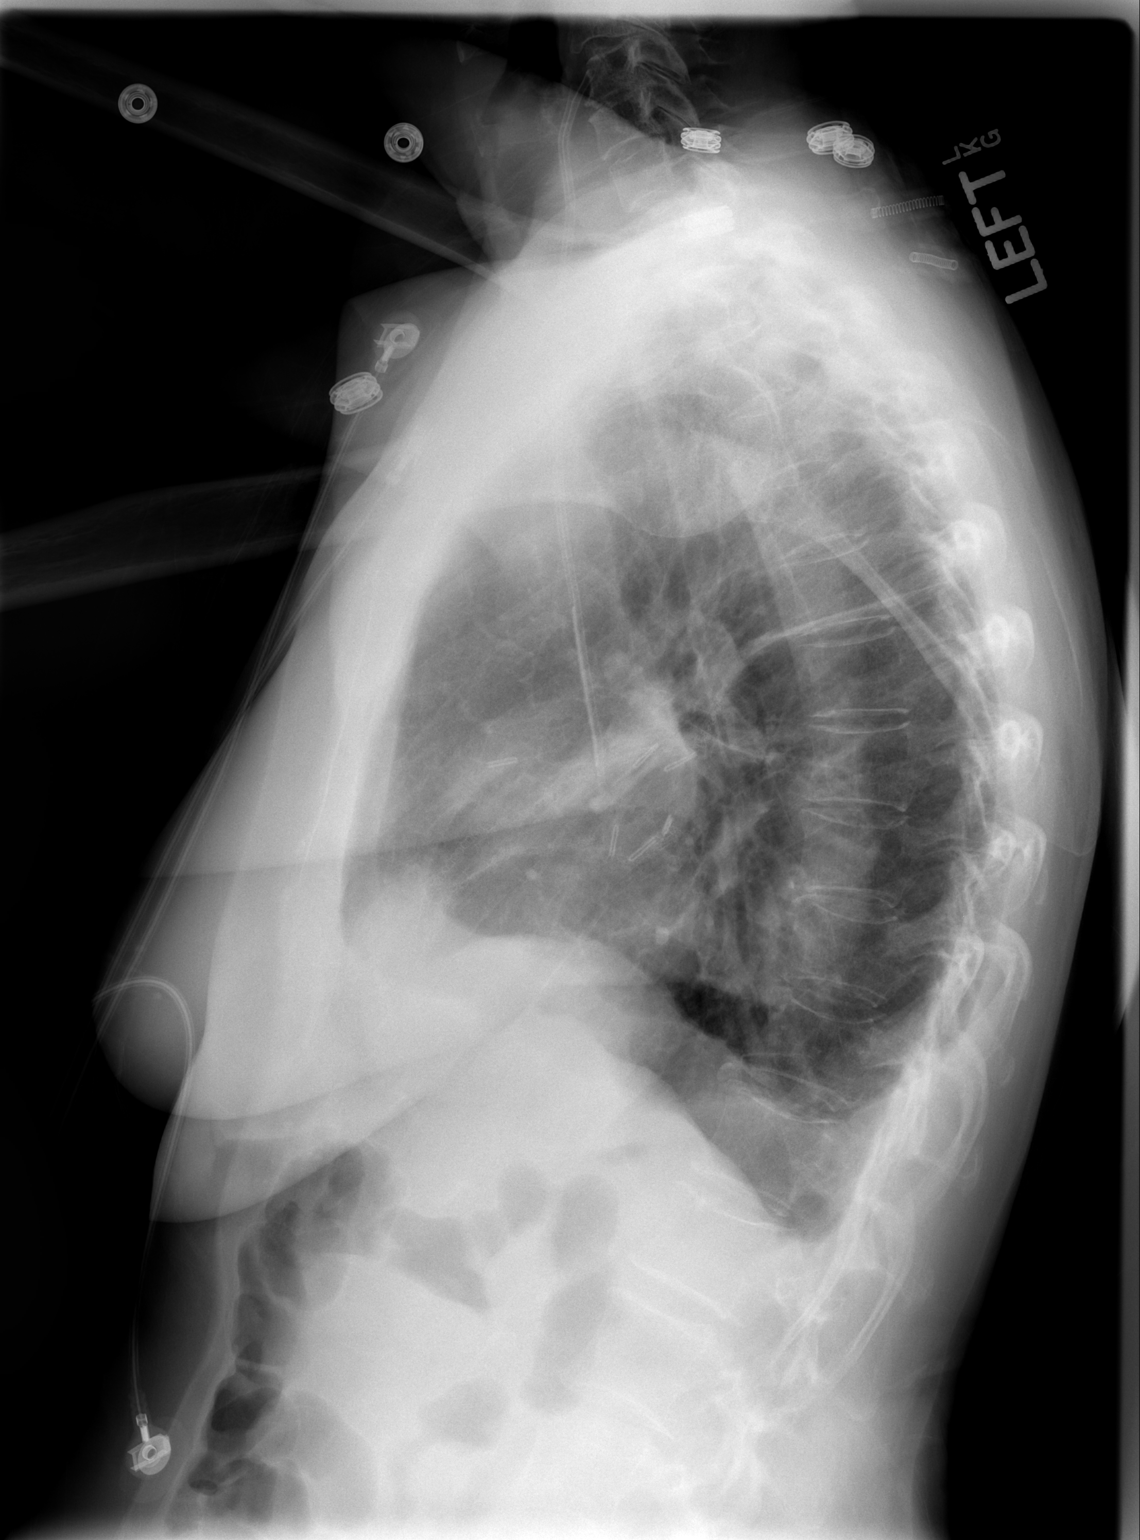

[2 of 2 positions shown; findings below may reference images not displayed]

FINDINGS: Support apparatus stable. Postoperative changes are again seen in the right mid and lower lung zone with right pleural fluid or thickening.  No pneumothorax. Trace left pleural effusion.
IMPRESSION: Postoperative changes on the right with right pleural fluid and/or thickening. Tiny left pleural effusion.

## 2006-06-22 ENCOUNTER — Ambulatory Visit: Payer: Self-pay | Admitting: Thoracic Surgery

## 2006-06-22 ENCOUNTER — Encounter: Admission: RE | Admit: 2006-06-22 | Discharge: 2006-06-22 | Payer: Self-pay | Admitting: Thoracic Surgery

## 2006-06-22 IMAGING — CR DG CHEST 2V
2 series · 2 of 2 positions shown · non-contrast
Comparison: 06/13/06

CLINICAL DATA: Carcinoid tumor;   post op.
 CHEST - 2 VIEW:

[w chest pa]
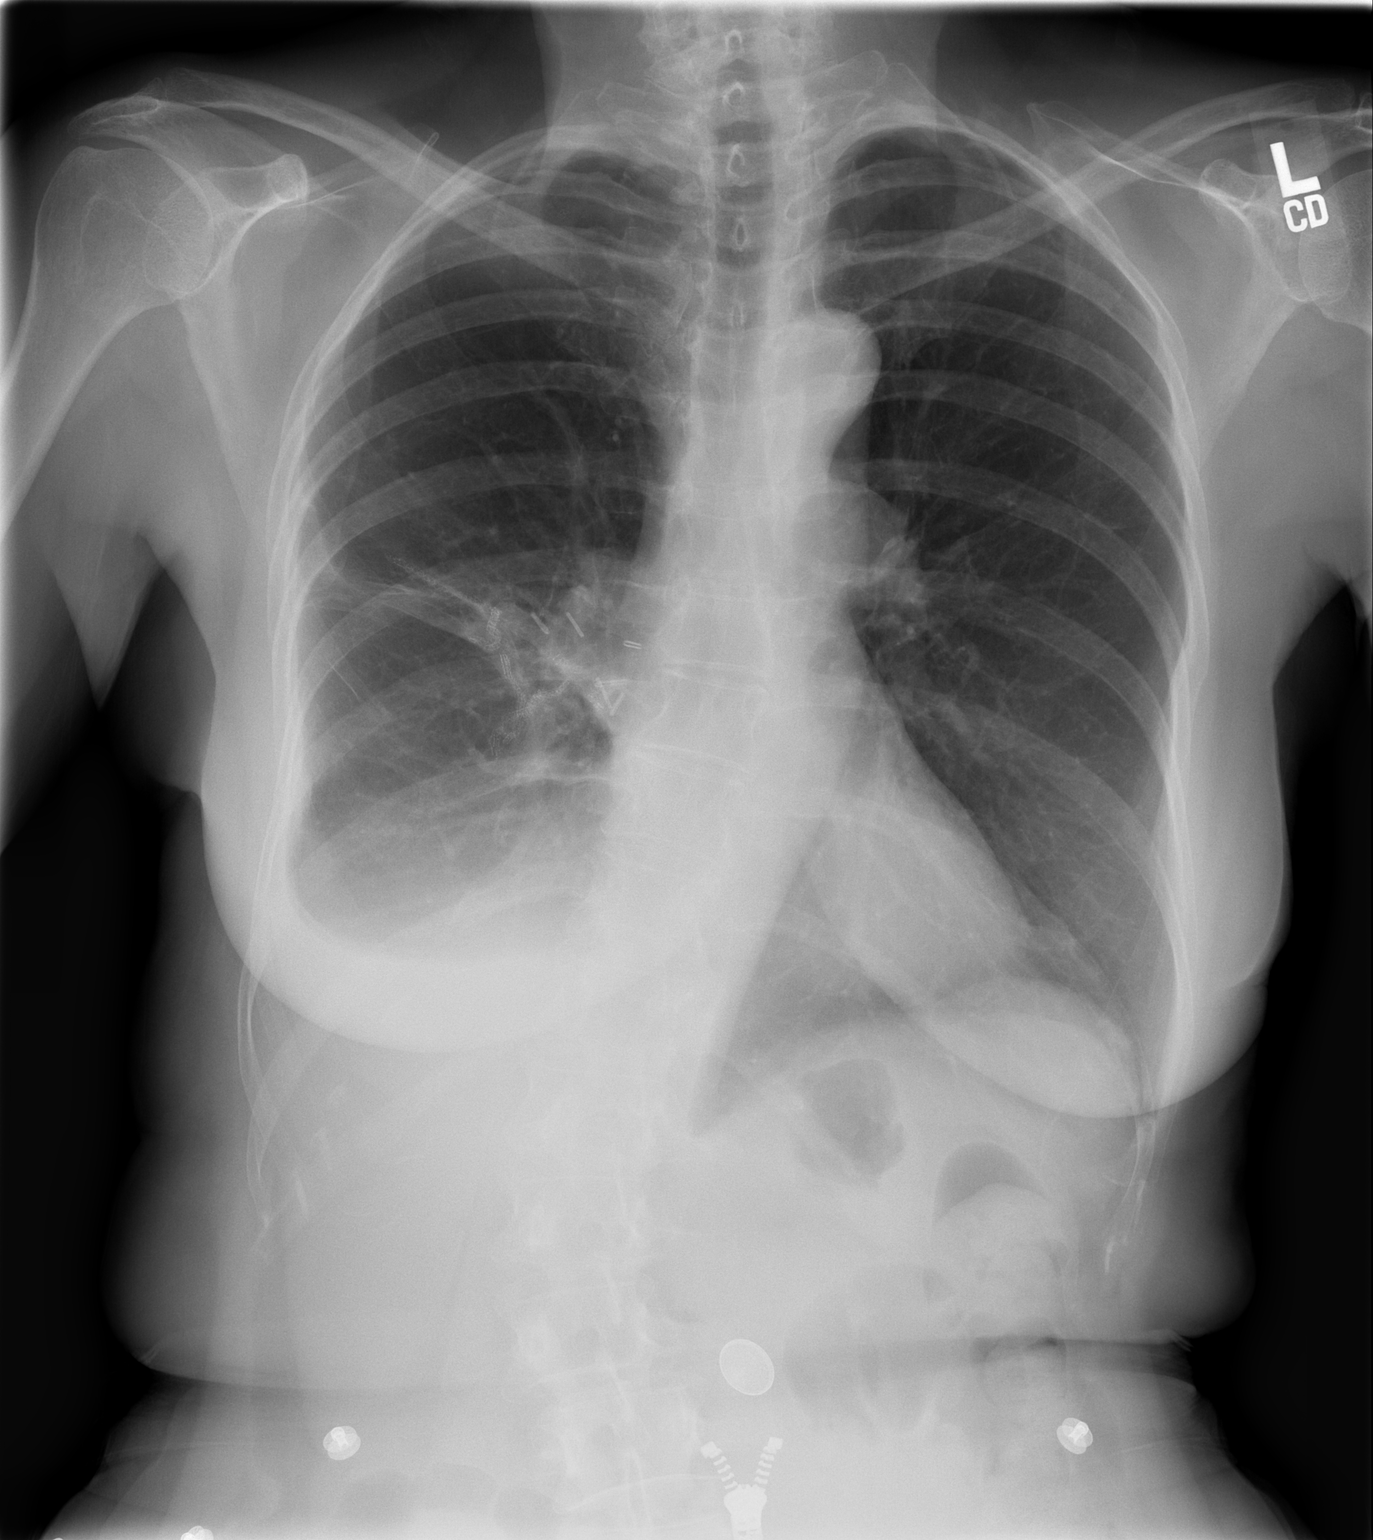

[w chest lat]
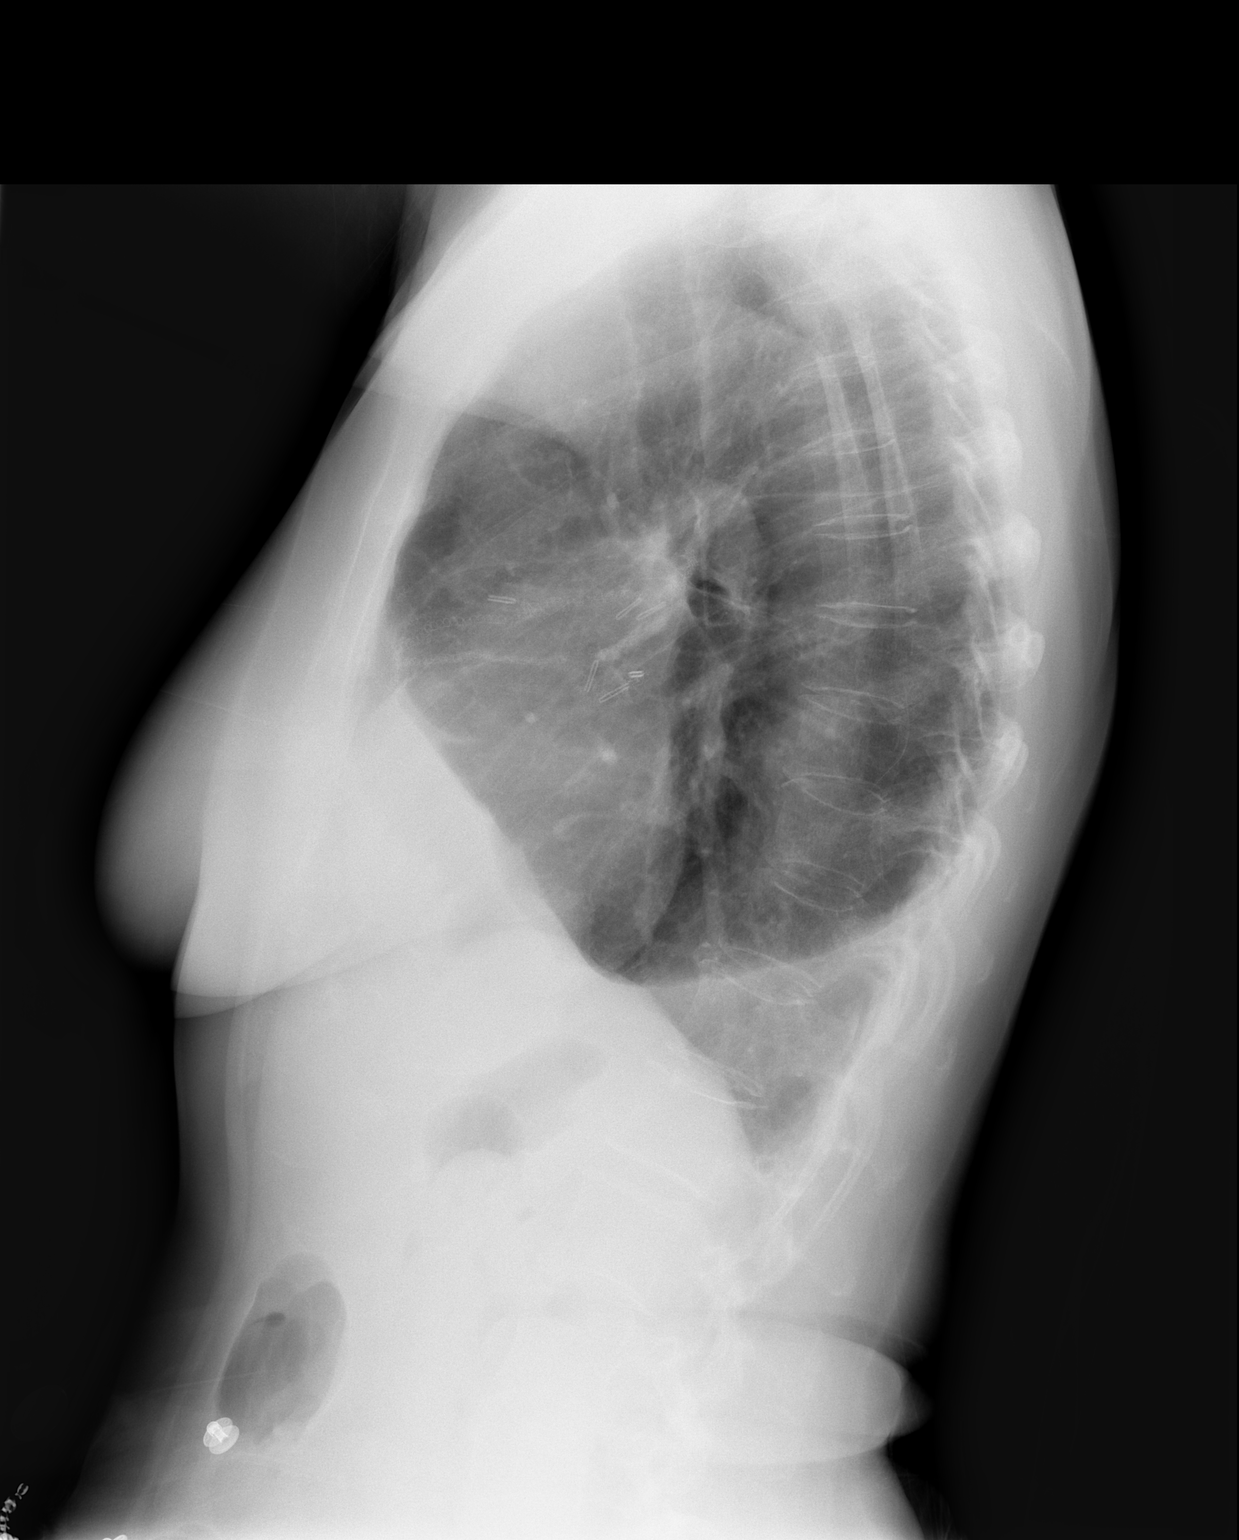

[2 of 2 positions shown; findings below may reference images not displayed]

FINDINGS: Two views of the chest show slight increase in a small right pleural effusion.  Post op change in the right middle lobe is stable.  The left lung is clear.   Heart 
 size is stable.  Thoracolumbar scoliosis is noted.
IMPRESSION: Slight increase in small right pleural effusion.  Stable post op change in the right middle lobe.

## 2006-07-27 ENCOUNTER — Encounter: Admission: RE | Admit: 2006-07-27 | Discharge: 2006-07-27 | Payer: Self-pay | Admitting: Thoracic Surgery

## 2006-07-27 ENCOUNTER — Ambulatory Visit: Payer: Self-pay | Admitting: Thoracic Surgery

## 2006-07-27 IMAGING — CR DG CHEST 2V
2 series · 2 of 2 positions shown · non-contrast
Comparison: none

CLINICAL DATA: Carcinoid tumor post surgery, 06/09/06.  Currently no complaints. 
CHEST - TWO VIEWS:

[w chest pa]
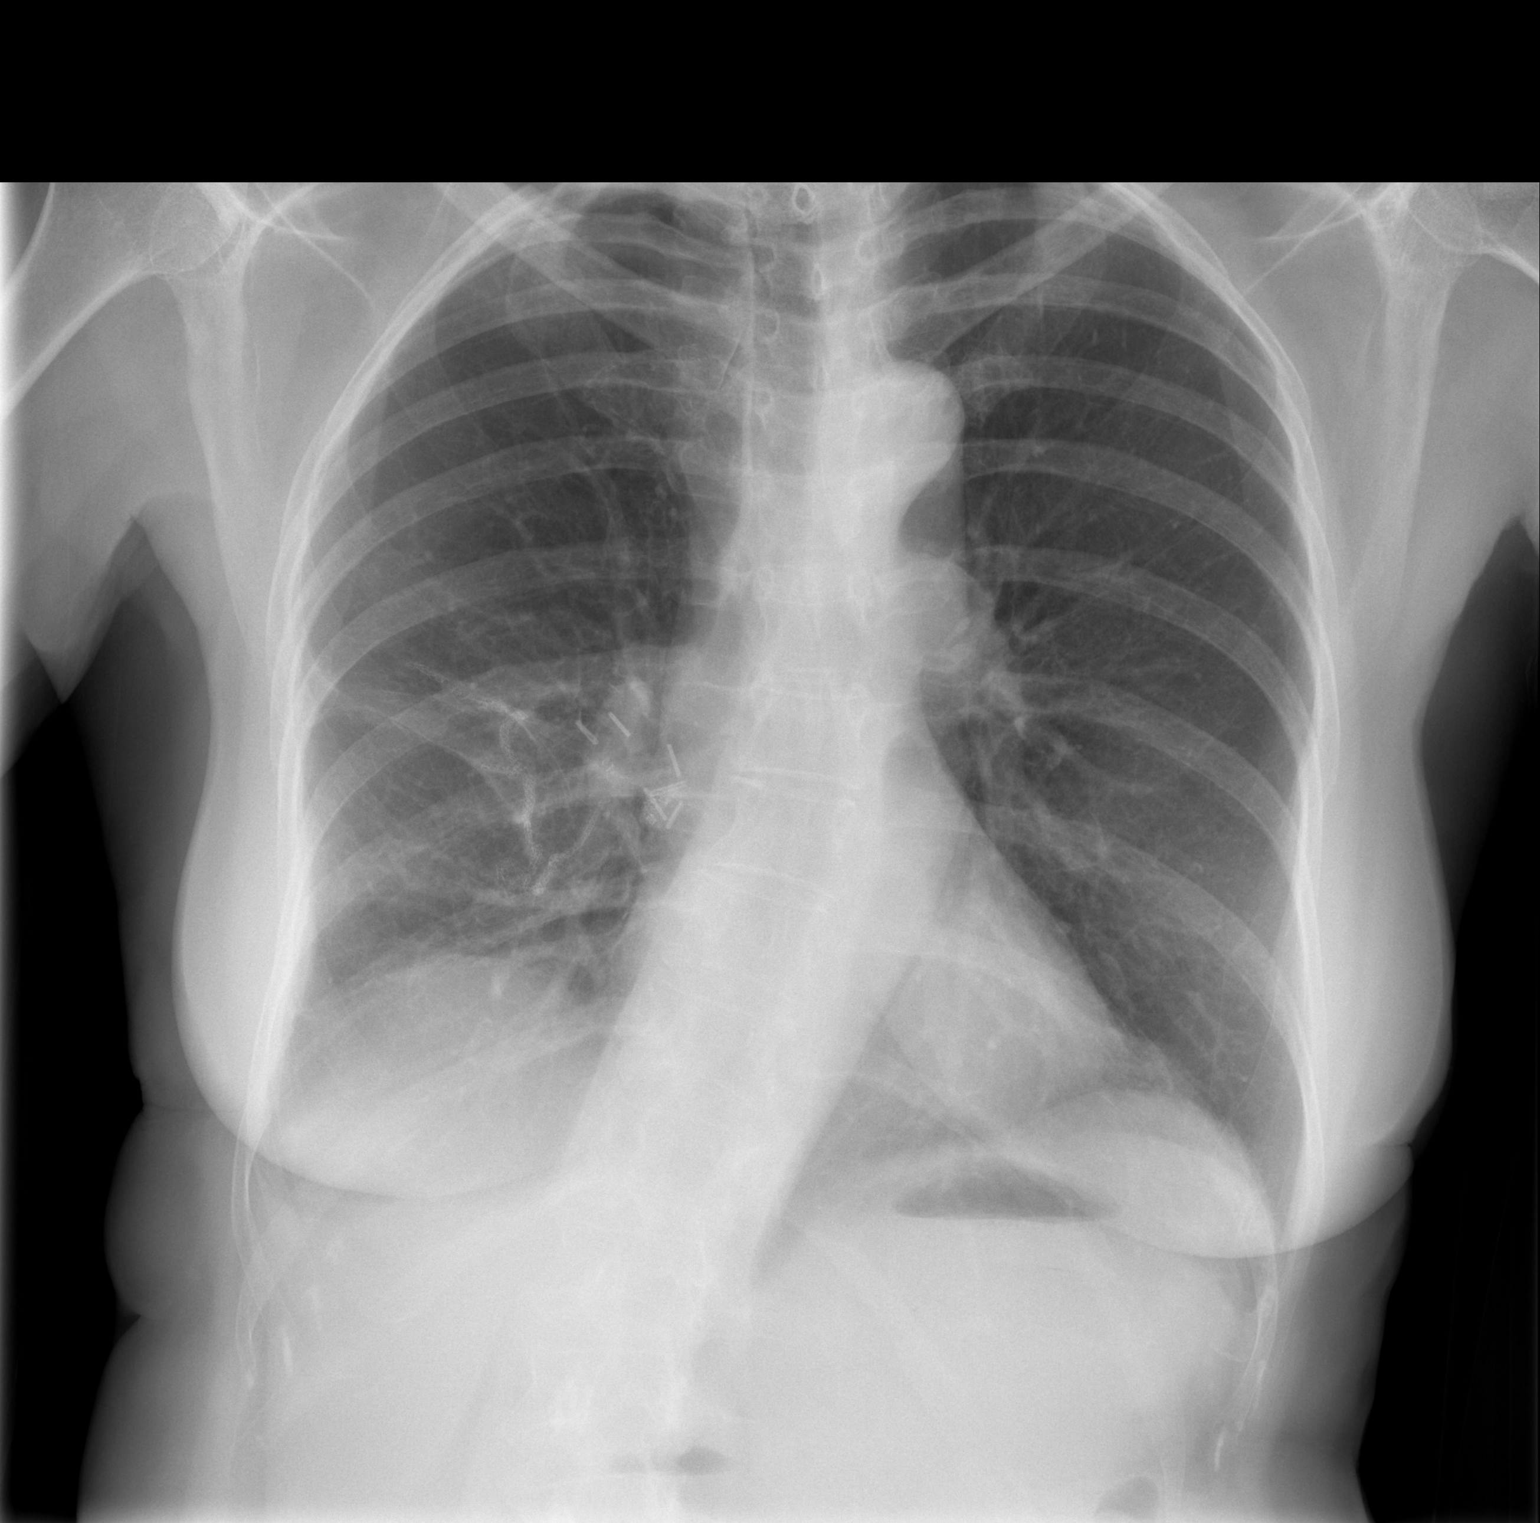

[w chest lat]
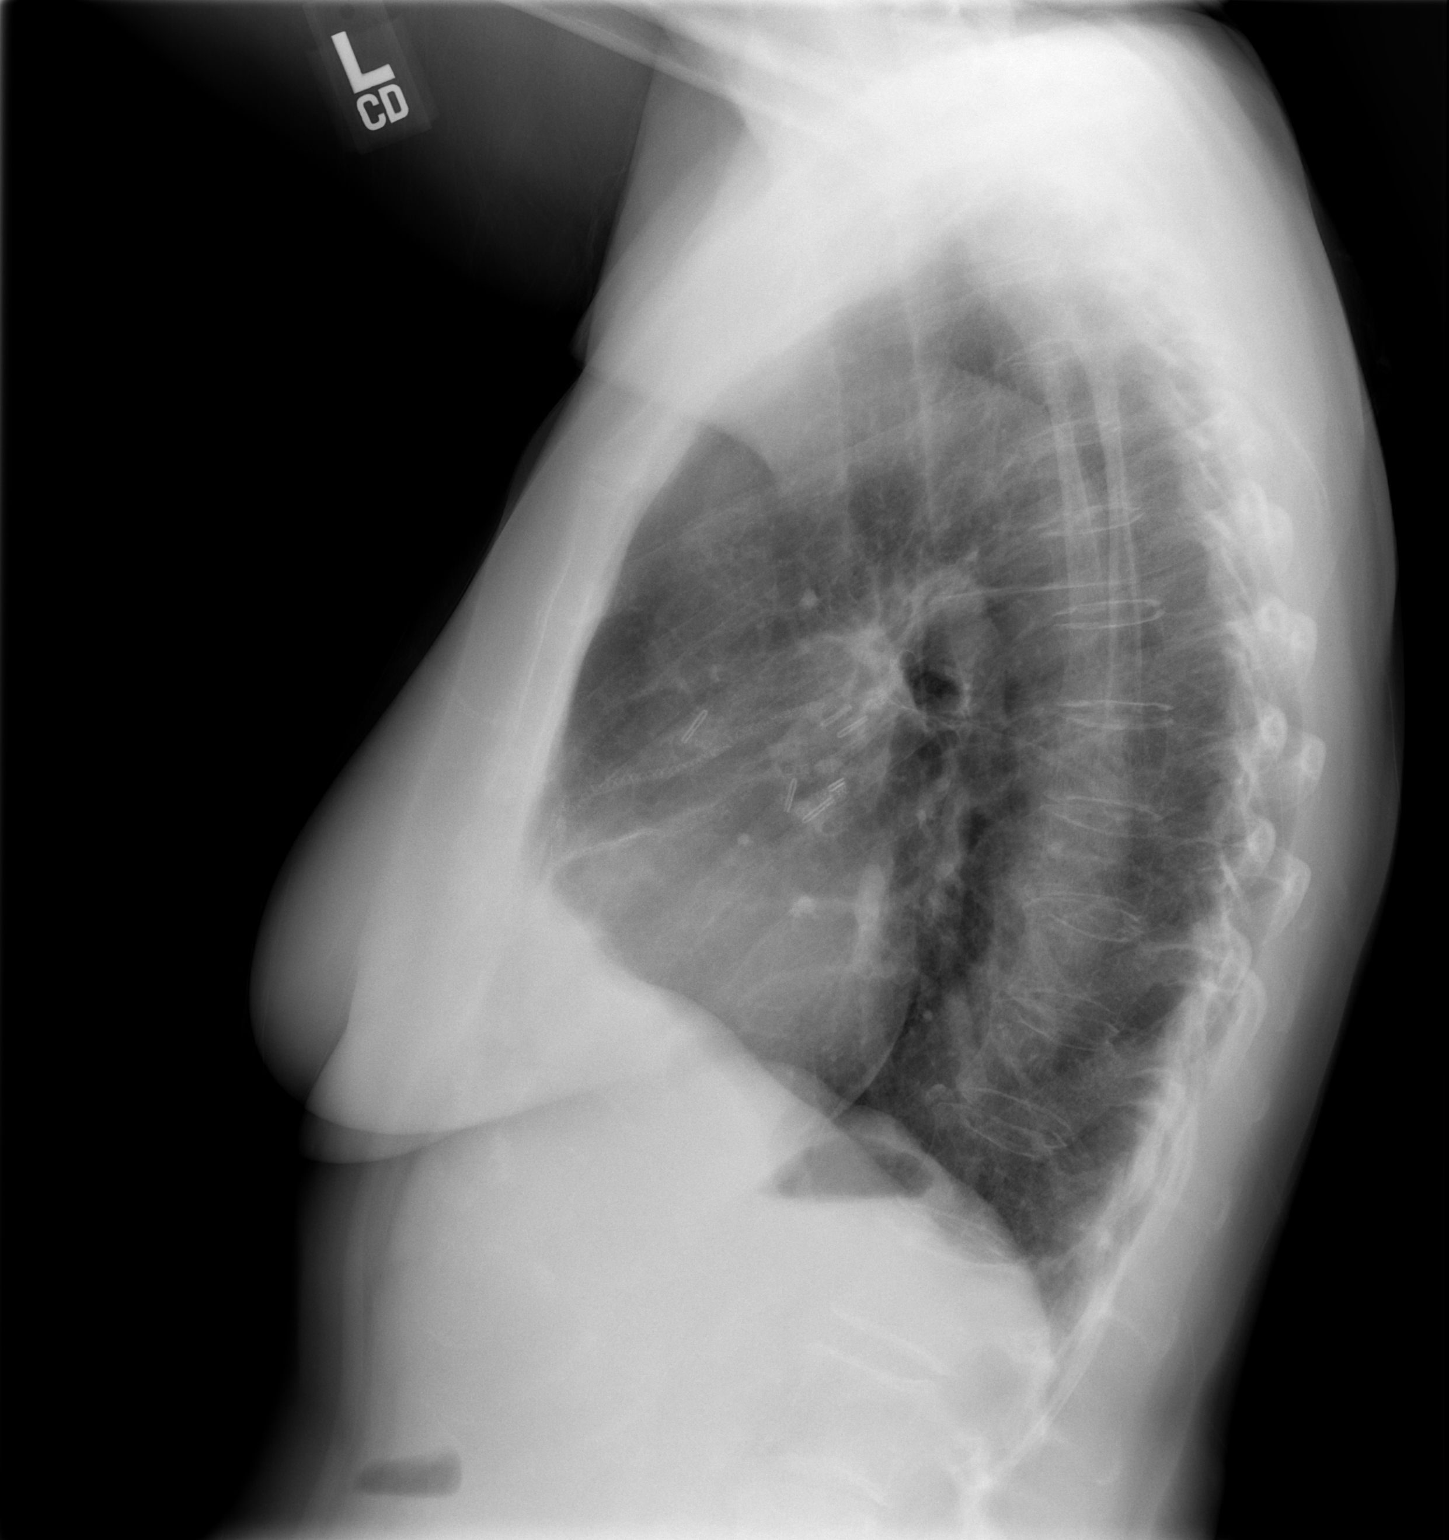

[2 of 2 positions shown; findings below may reference images not displayed]

FINDINGS: Interval clearing of previous small right pleural effusion is seen with no change in right lower lobectomy findings with surgical clips and linear scarring.  Lungs are otherwise clear.  Heart size is normal.  Slight to moderate dextroscoliosis of thoracolumbar spine junction is stable with no other new abnormalities seen.  Dorsal osteopenia again noted.
IMPRESSION: 1.  Interval clearing of previous small right pleural effusion.
2.  Post right lower lobectomy.
3.  Stable scoliosis and osteopenia.
4.  Otherwise no active disease.

## 2006-10-11 ENCOUNTER — Ambulatory Visit: Payer: Self-pay | Admitting: Thoracic Surgery

## 2006-10-11 ENCOUNTER — Encounter: Admission: RE | Admit: 2006-10-11 | Discharge: 2006-10-11 | Payer: Self-pay | Admitting: Thoracic Surgery

## 2006-10-11 IMAGING — CR DG CHEST 2V
2 series · 2 of 2 positions shown · non-contrast
Comparison: none

CLINICAL DATA: Lung cancer, postop, [DATE].
 CHEST - TWO VIEWS:

[view not recorded (1 of 2)]
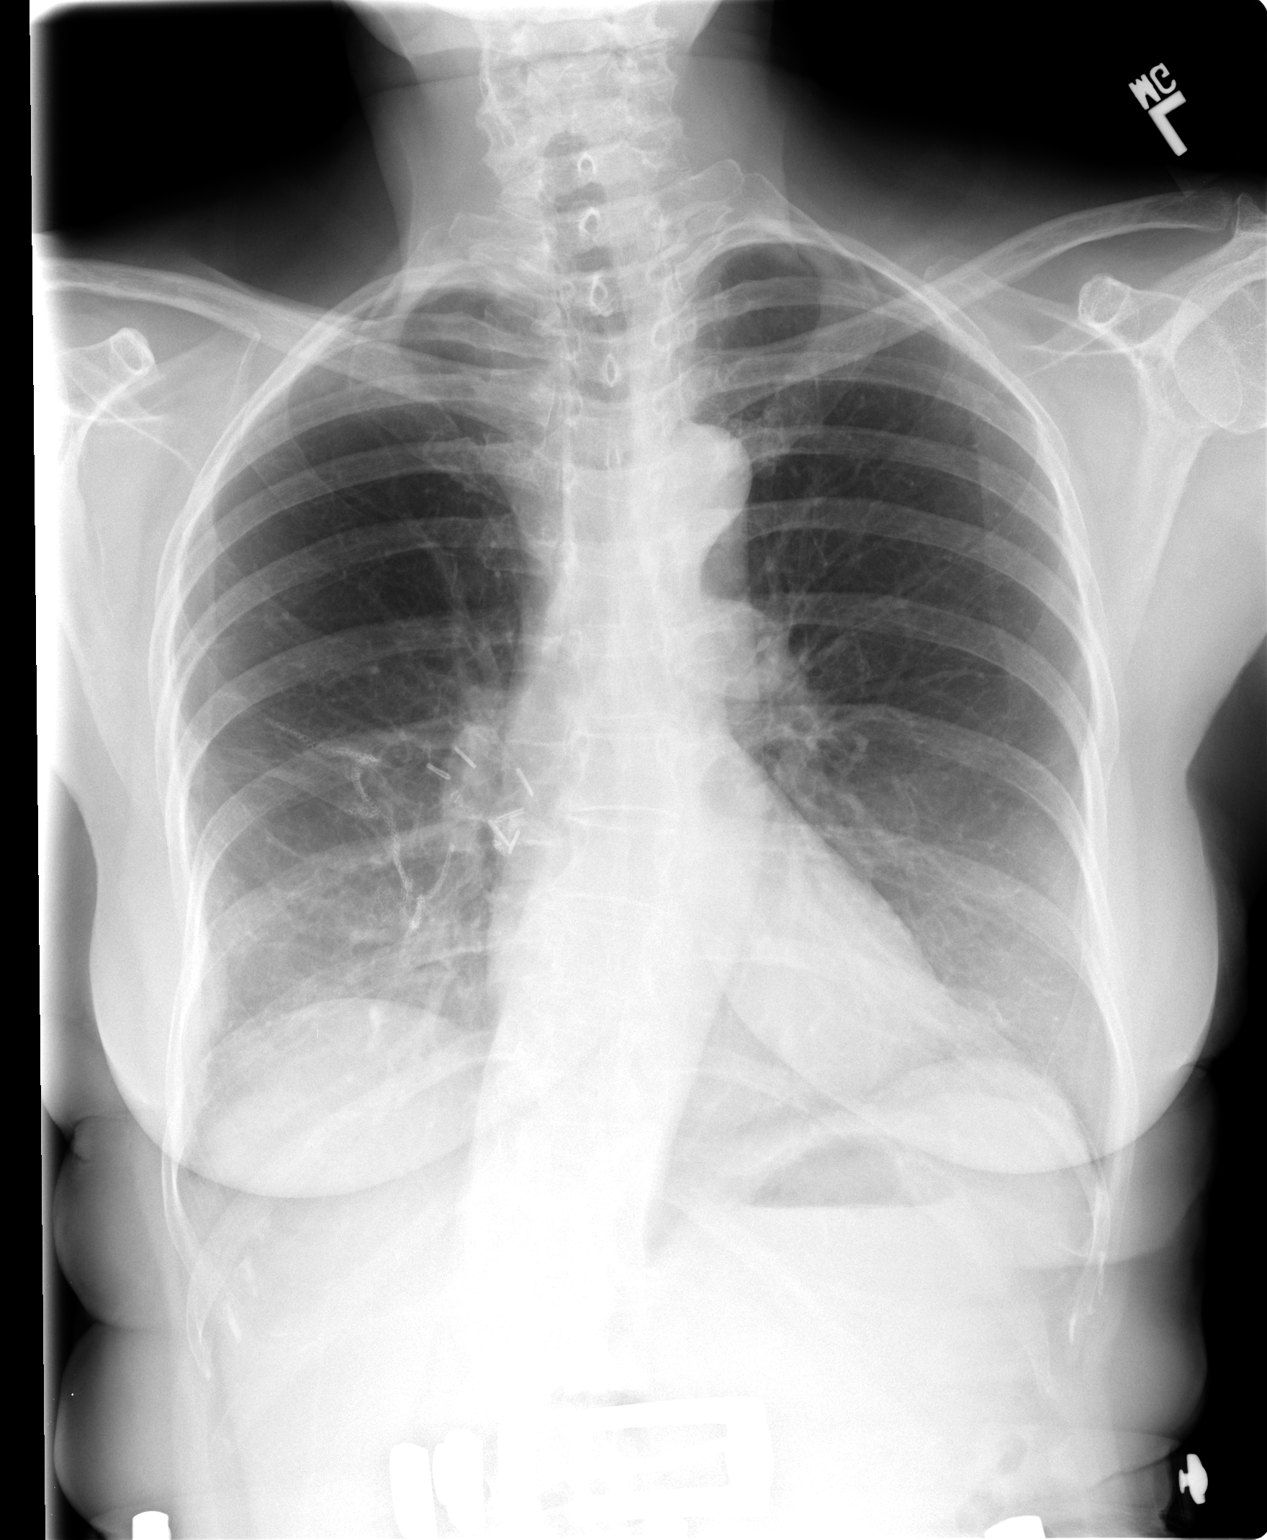

[view not recorded (2 of 2)]
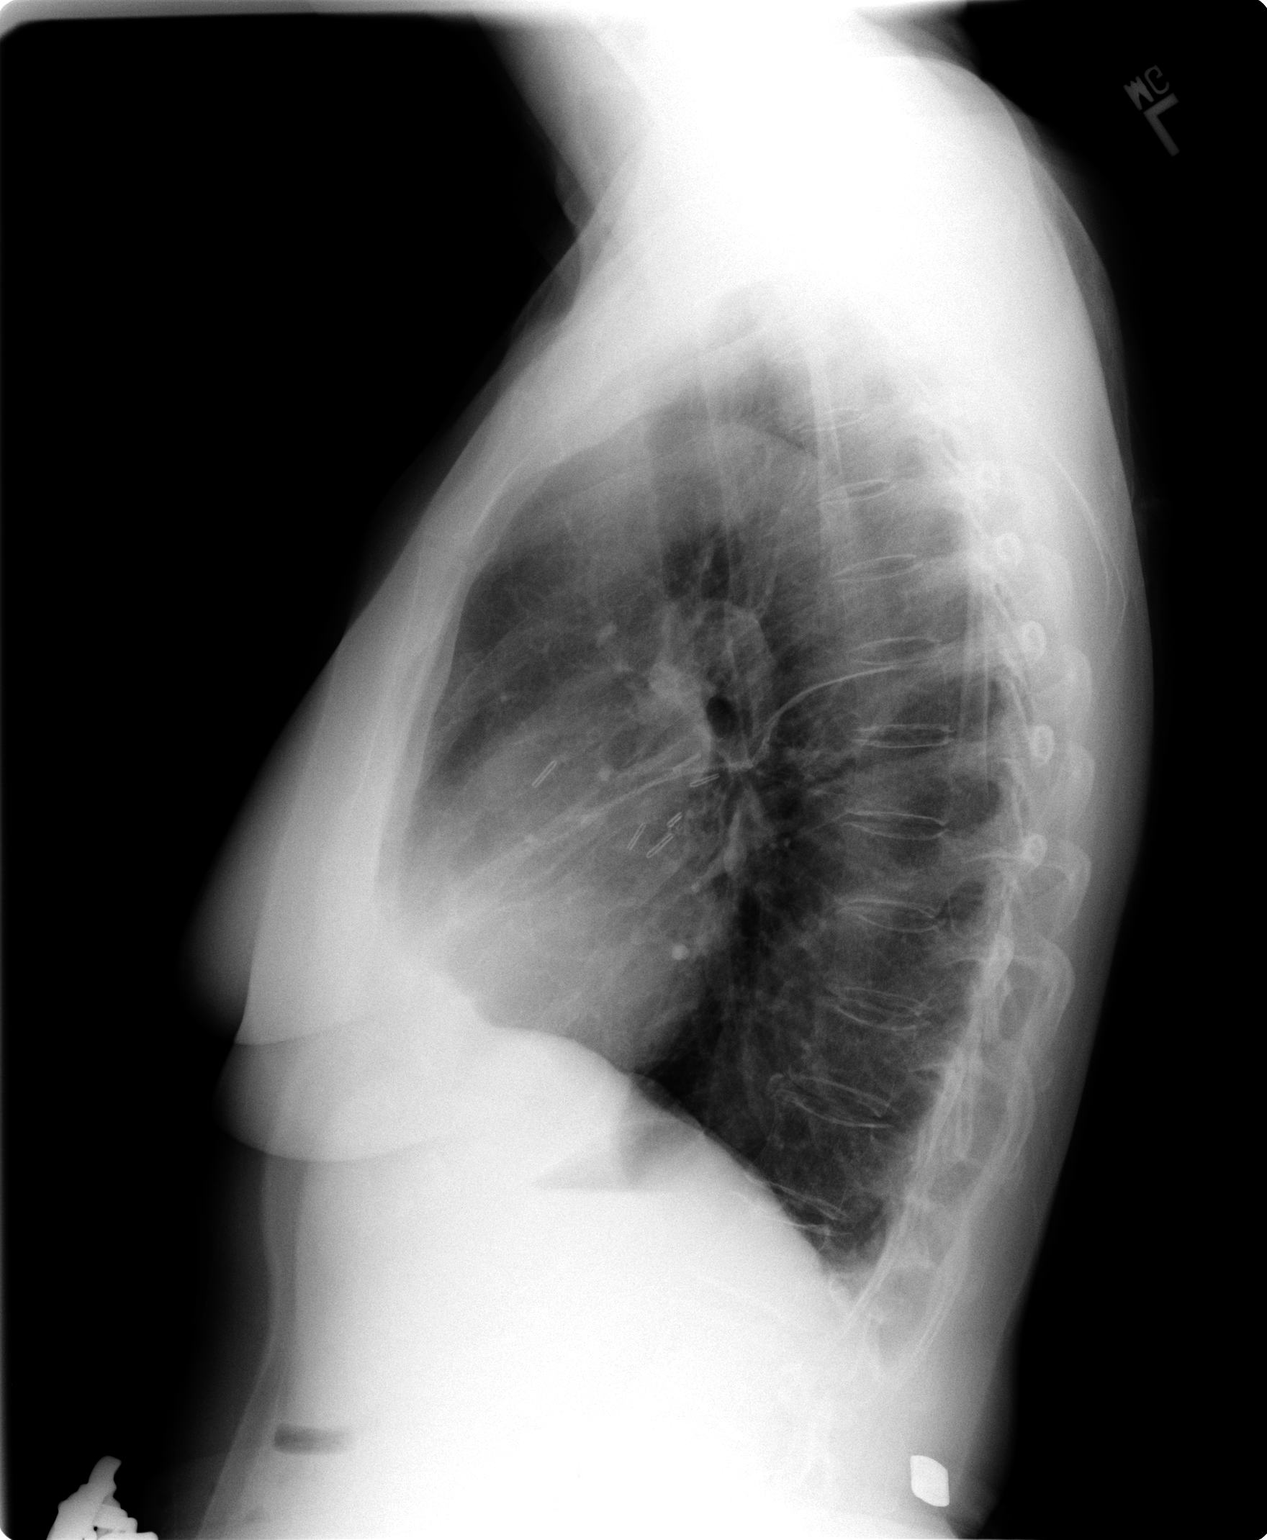

[2 of 2 positions shown; findings below may reference images not displayed]

FINDINGS: Stable postoperative changes of right lower lobectomy noted with sight dextroscoliosis of thoracolumbar spine junction. Slight increased pleural thickening is seen at the lateral right lower lobe.    Lungs are otherwise clear.  Heart size remains normal.  No other new abnormality is seen.
IMPRESSION: 1.  Slight increased pleural thickening at lateral right lower lobe.
 2.  Stable right lower lobectomy postoperative changes and scoliosis/dorsal osteopenia.
 3.  Otherwise no active disease.

## 2007-01-11 ENCOUNTER — Ambulatory Visit: Payer: Self-pay | Admitting: Thoracic Surgery

## 2007-01-11 ENCOUNTER — Encounter: Admission: RE | Admit: 2007-01-11 | Discharge: 2007-01-11 | Payer: Self-pay | Admitting: Thoracic Surgery

## 2007-01-11 IMAGING — CT CT CHEST W/O CM
2 of 3 series · 15 of 36 positions shown, 18 images · IV contrast (agent unspecified)
Comparison: 03/30/06.

CLINICAL DATA: Lung cancer surgery, Saturday May, 2006, follow-up.
 CHEST CT WITHOUT CONTRAST:
TECHNIQUE: Multidetector CT imaging of the chest was performed following the standard protocol without IV contrast.

[Series 3: routine chest · axial · 0.70mm/px · z∈[-261,-11]mm · 12 of 60 slices shown, 15 images]
[im 5/60  mediastinal]
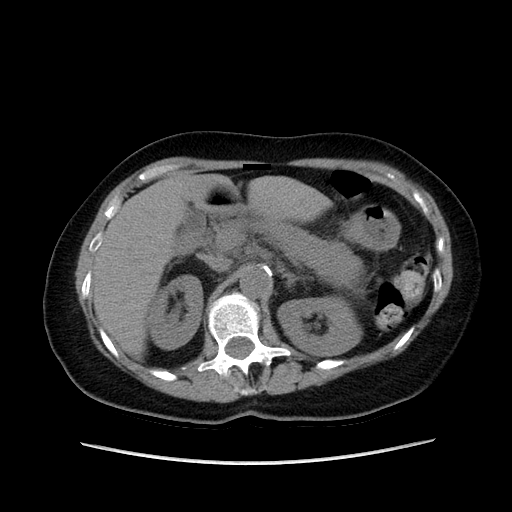
[im 5/60  lung]
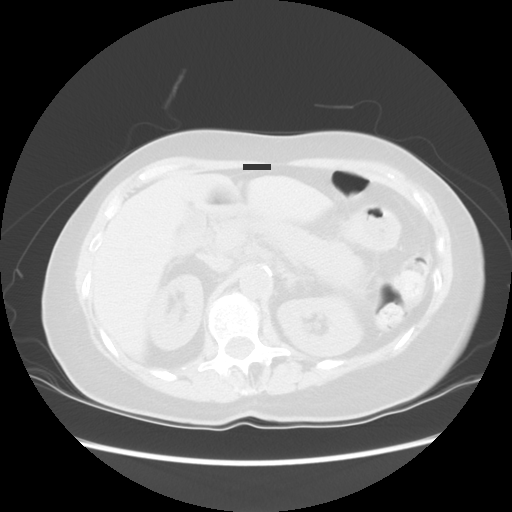
[im 9/60  lung]
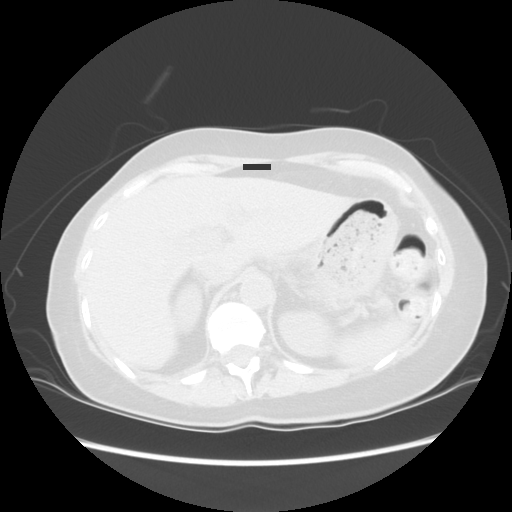
[im 14/60  lung]
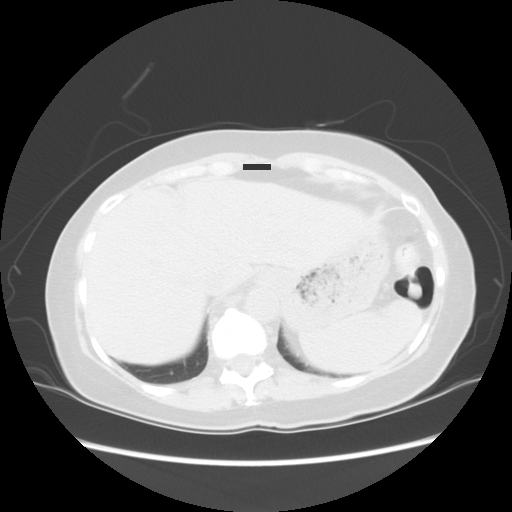
[im 18/60  lung]
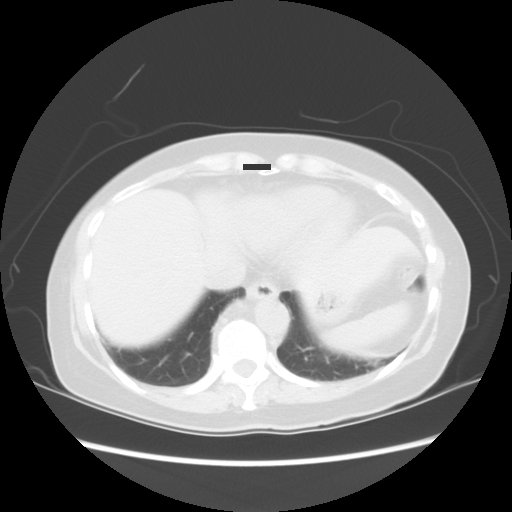
[im 22/60  mediastinal]
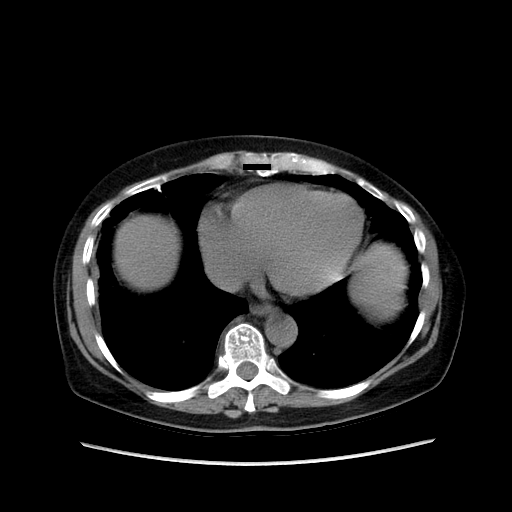
[im 22/60  lung]
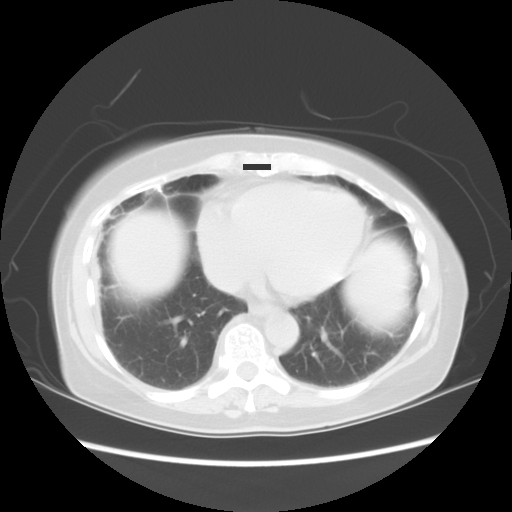
[im 27/60  lung]
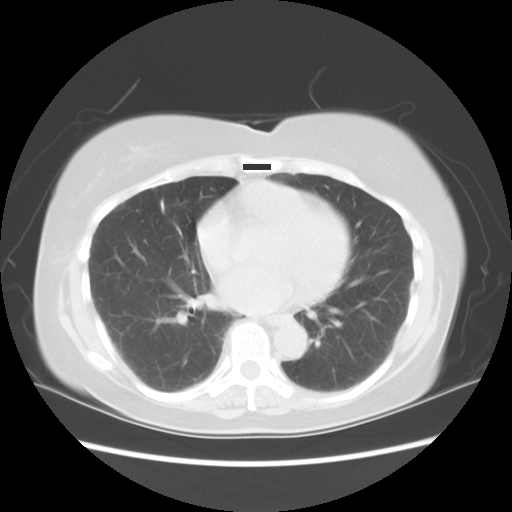
[im 33/60  lung]
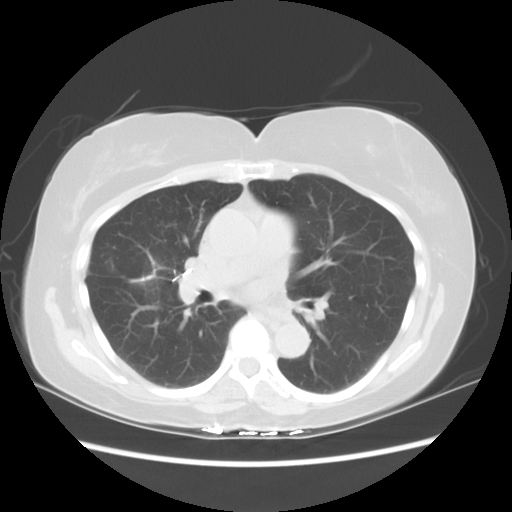
[im 38/60  lung]
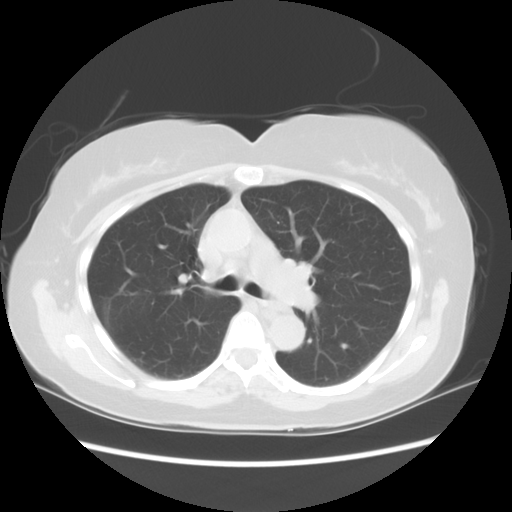
[im 42/60  mediastinal]
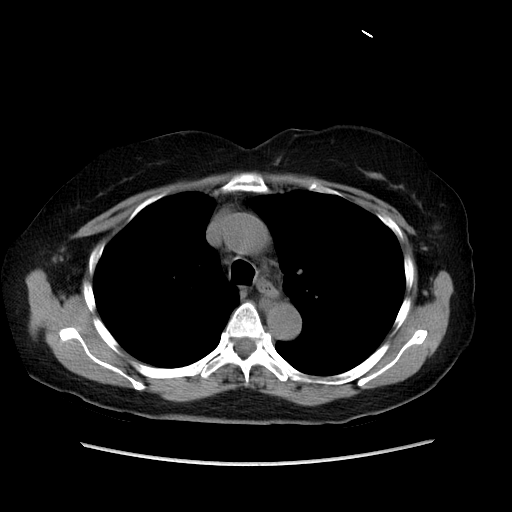
[im 42/60  lung]
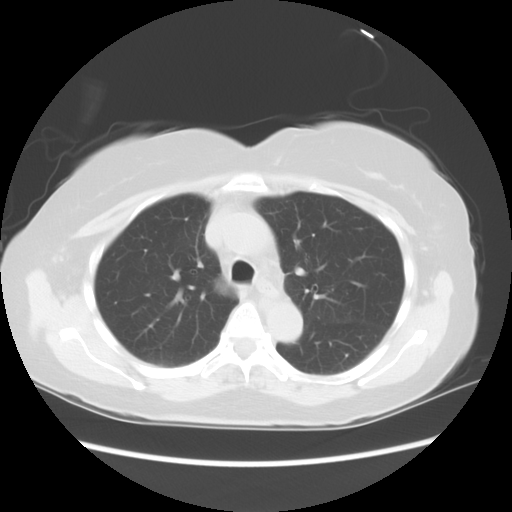
[im 46/60  lung]
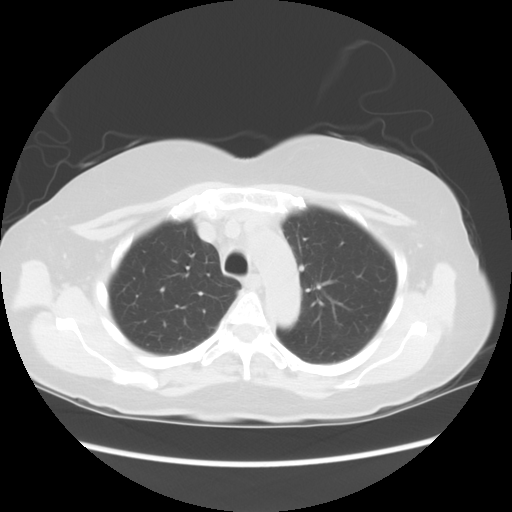
[im 51/60  lung]
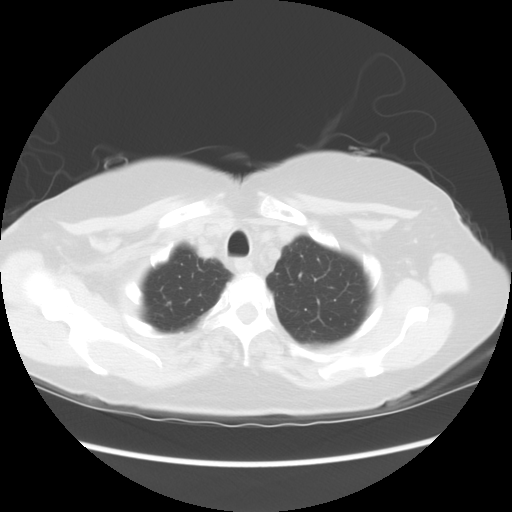
[im 55/60  lung]
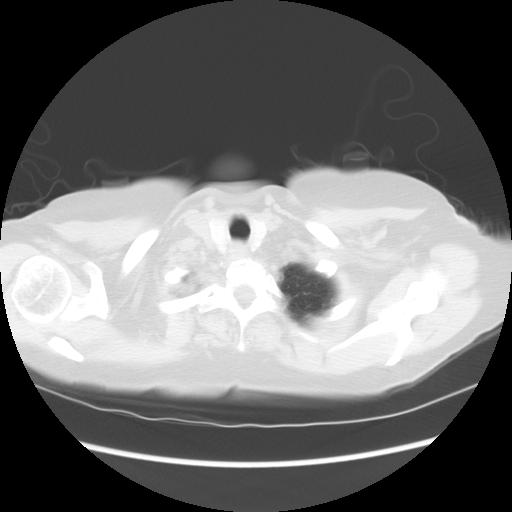

[Series 602: sagittal body · sagittal · 0.70mm/px · 3 of 145 slices shown]
[im 29/145  lung]
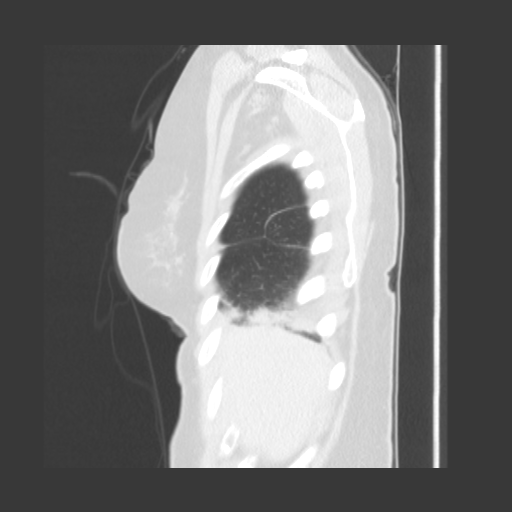
[im 58/145  lung]
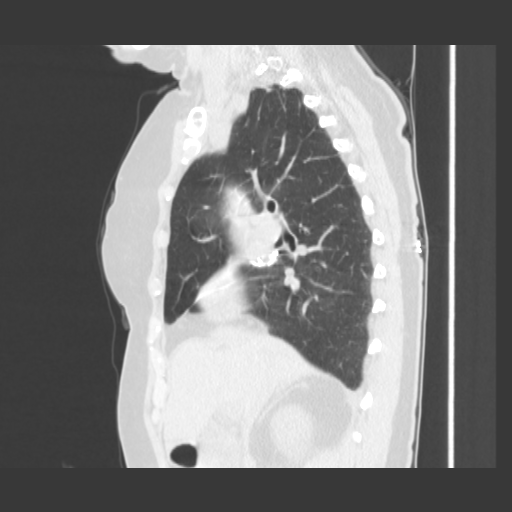
[im 87/145  lung]
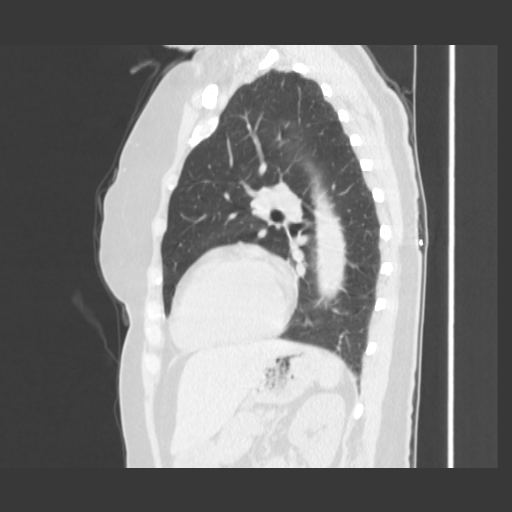

[15 of 36 positions shown; findings below may reference images not displayed]

The nodule noted on the prior study in the right middle lobe has been resected with postop linear scarring present.  No additional lung nodule is seen.  No effusion is noted.  No mediastinal or hilar adenopathy is noted.  A few small nodes are seen in the retrocrural region but these are unchanged compared to the prior CT.  Small peripancreatic nodes again are noted on this unenhanced study.  No bony abnormality is seen.
IMPRESSION: 1.  Right middle lobe nodule has been resected with linear scarring at the right lung base.  No additional lung nodule is seen and no adenopathy is noted.
 2.  No change in retrocrural nodes and slightly prominent peripancreatic node on this unenhanced study.

## 2007-04-06 ENCOUNTER — Encounter: Admission: RE | Admit: 2007-04-06 | Discharge: 2007-04-06 | Payer: Self-pay | Admitting: Thoracic Surgery

## 2007-04-06 ENCOUNTER — Ambulatory Visit: Payer: Self-pay | Admitting: Thoracic Surgery

## 2007-06-29 ENCOUNTER — Other Ambulatory Visit: Admission: RE | Admit: 2007-06-29 | Discharge: 2007-06-29 | Payer: Self-pay | Admitting: Internal Medicine

## 2007-07-06 IMAGING — US US SOFT TISSUE HEAD/NECK
1 series · 13 of 19 positions shown · non-contrast
Comparison: NONE

CLINICAL DATA: Question enlargement on physical exam. 

THYROID ULTRASOUND

[Series 1: us thyroid · 0.05mm/px · 13 of 19 slices shown]
[im 1/19]
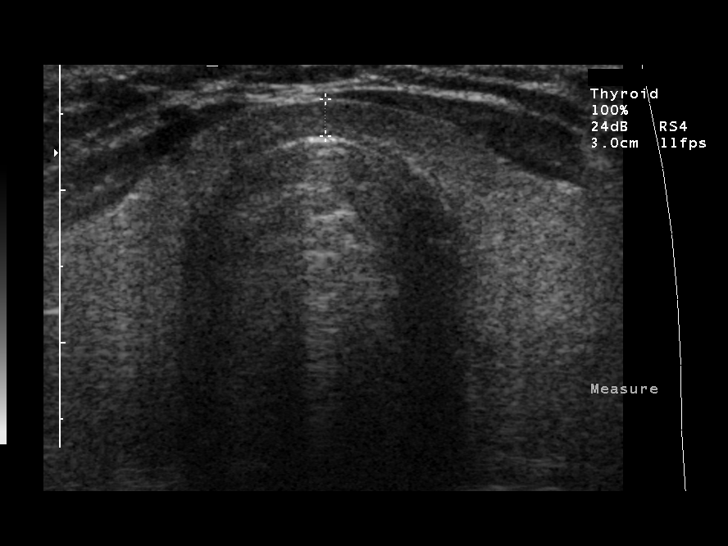
[im 3/19]
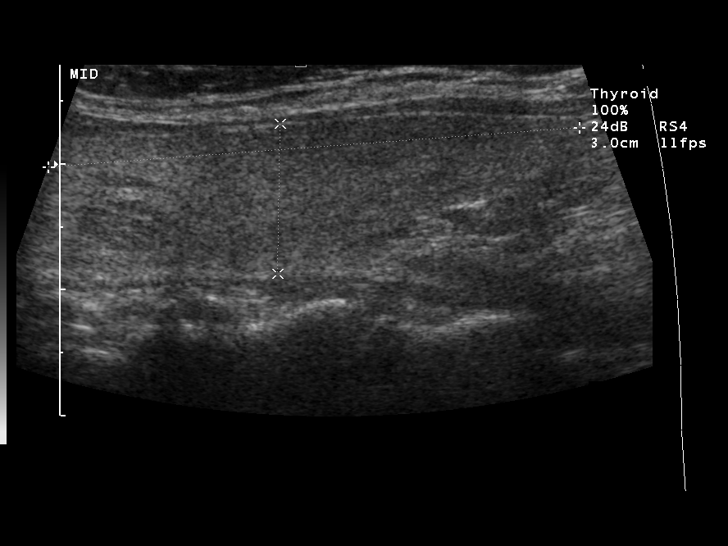
[im 4/19]
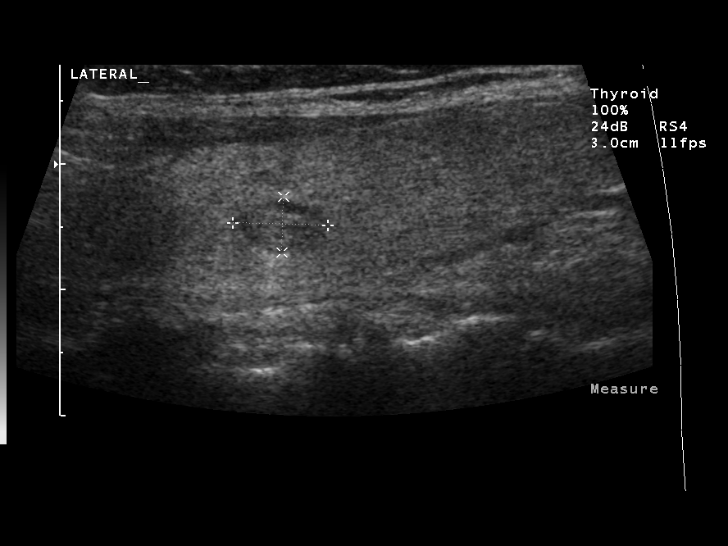
[im 6/19]
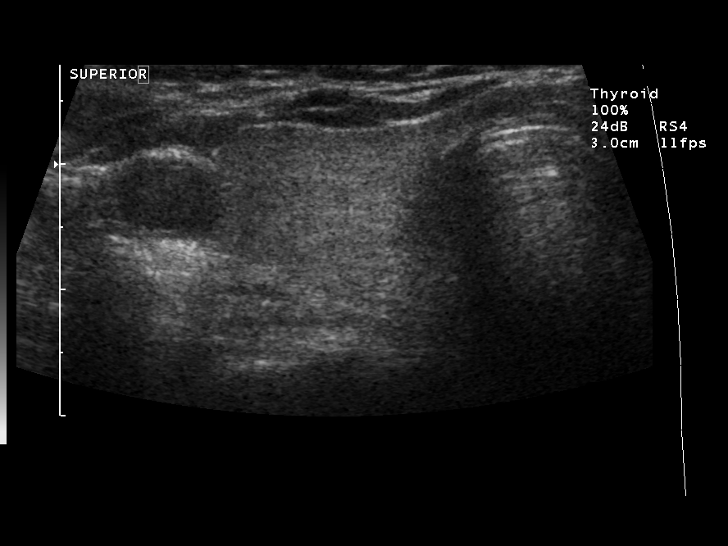
[im 7/19]
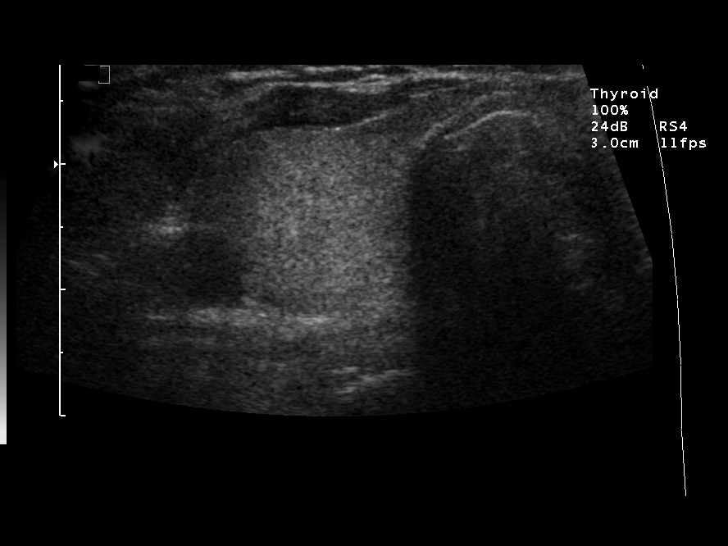
[im 9/19]
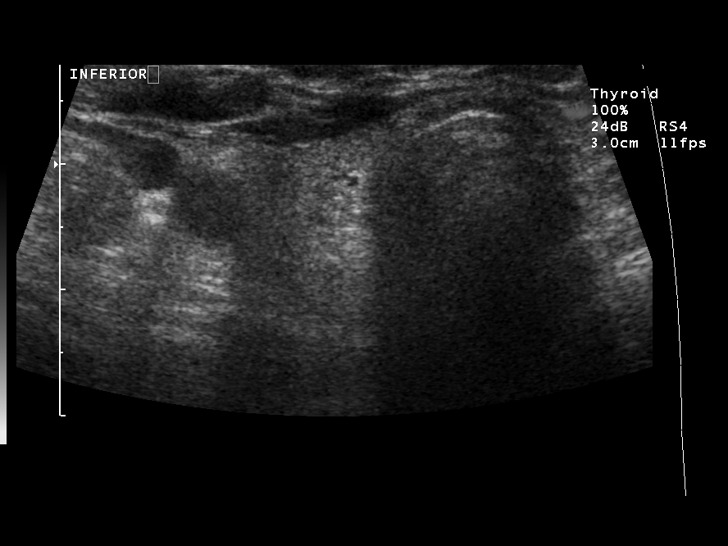
[im 10/19]
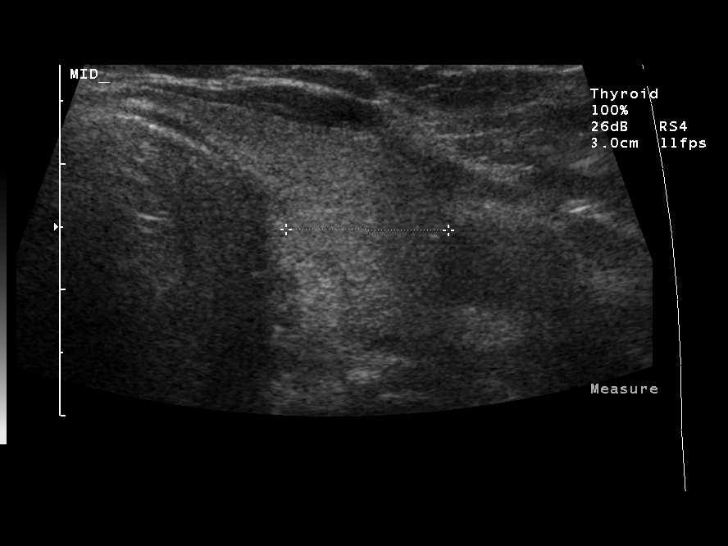
[im 11/19]
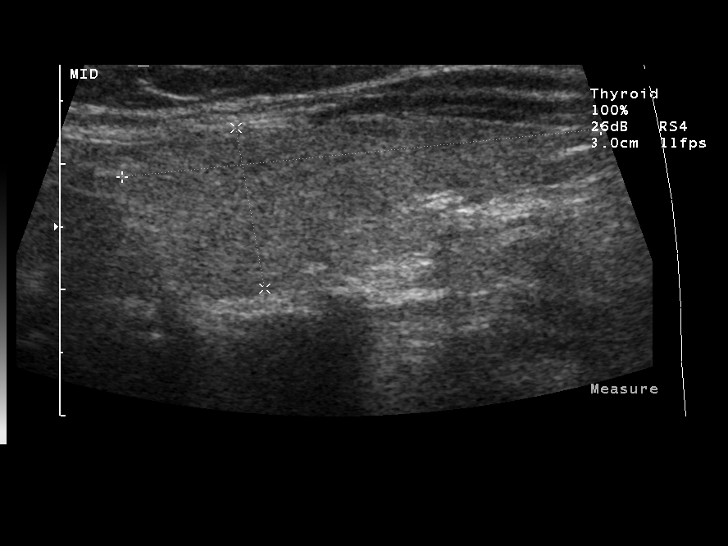
[im 13/19]
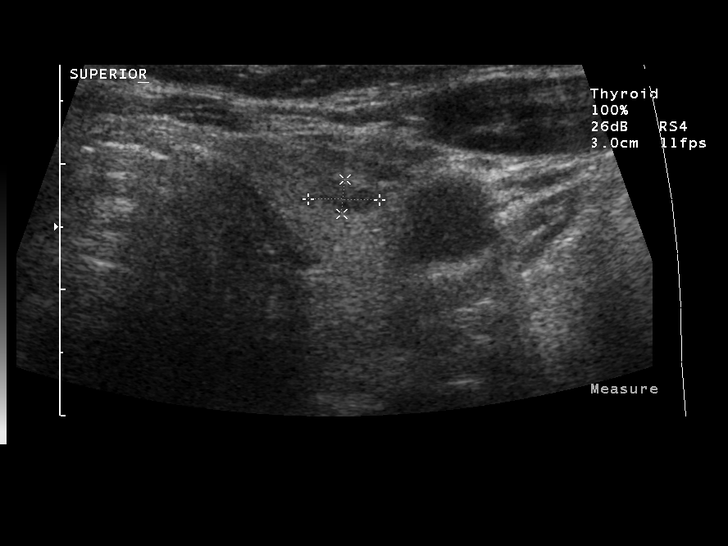
[im 14/19]
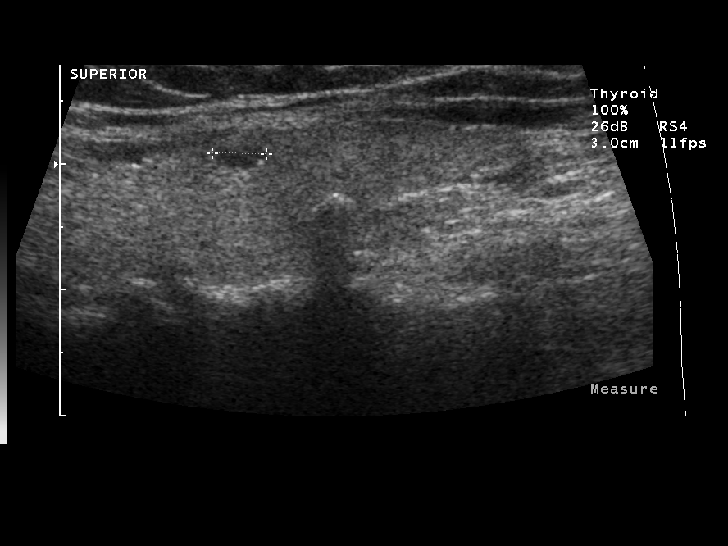
[im 16/19]
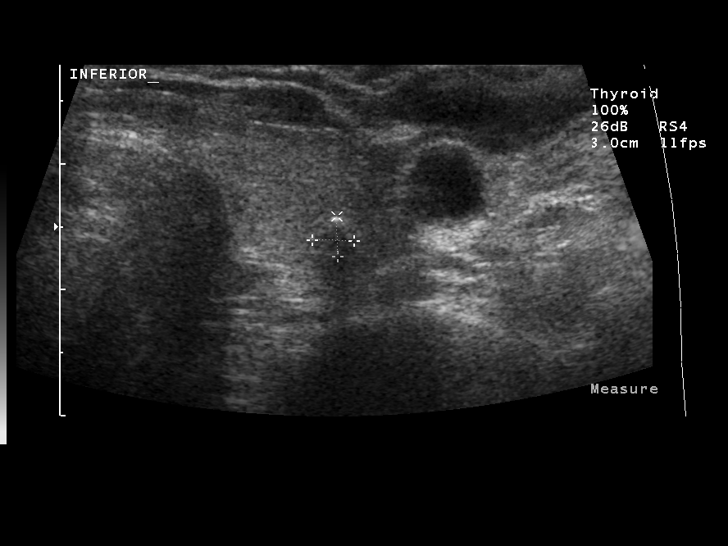
[im 17/19]
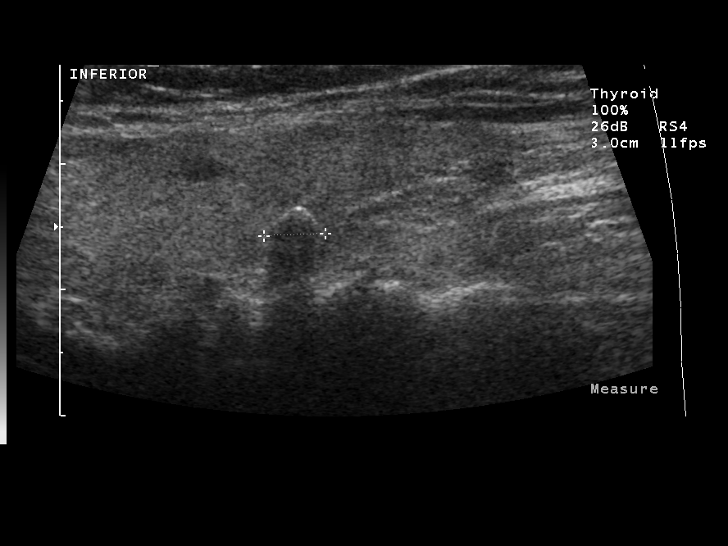
[im 19/19]
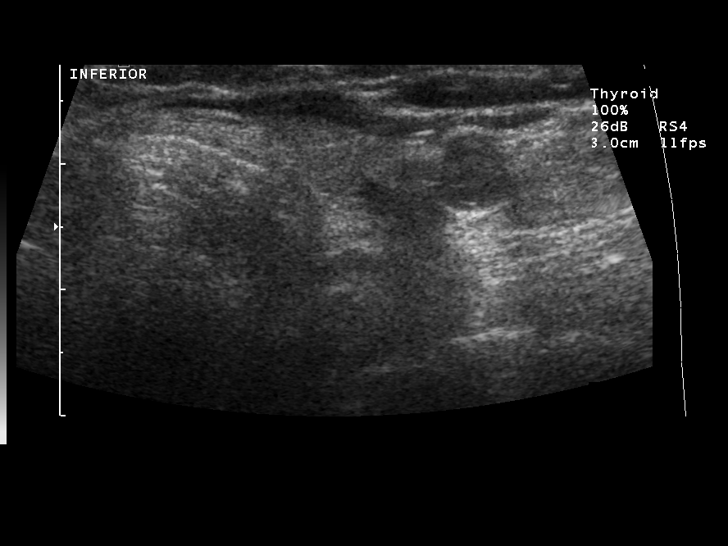

[13 of 19 positions shown; findings below may reference images not displayed]

FINDINGS: The thyroid gland is upper normal for size. The echo 
pattern is homogeneous. LEFT LOBE: 3.8 x 1.3 x 1.3 cm Located in 
the inferior left lobe is a poorly defined hypoechoic nodular area 
of echo change. A portion of the anterior rim of this nodular area 
is calcified. This measures 0.5 x 0.3 x 0.3 cm. A second 
ill-defined area of decreased echos is noted in the upper left 
lobe, measuring 0.6 x 0.3 x 0.4 cm. RIGHT LOBE: 4.2 x 1.2 x 1.8 cm 
Examination of the right lobe demonstrates a similar nodular area 
of decreased echos, although this appears better defined. This is 
located in the upper lobe and measures 0.8 x 0.4 x 0.7 cm. 
ISTHMUS: 0.2 cm
IMPRESSION: Borderline to mild thyromegaly. Subcentimeter-sized 
hypoechoic but solid-appearing nodules bilaterally. The smallest 
nodule in the inferior left lobe demonstrates rim calcification. 
This type of calcification is not necessarily associated with an 
increased risk of malignancy; however, I do recommend follow-up 
evaluation of the thyroid gland to demonstrate stability of these 
findings. Repeat study is recommended in approximately 6 months. 
Gojo Husien, M.D. electronically reviewed on 07/06/2007 
Dict Date: 07/06/2007  Tran Date: 07/06/2007 CAV  [REDACTED]

## 2007-07-06 IMAGING — US US ABDOMEN COMPLETE
1 series · 14 of 25 positions shown · non-contrast
Comparison: NONE

CLINICAL DATA: Episodic right upper quadrant pain. 

ABDOMINAL ULTRASOUND

[Series 1: us abd · 0.30mm/px · 14 of 45 slices shown]
[im 1/45]
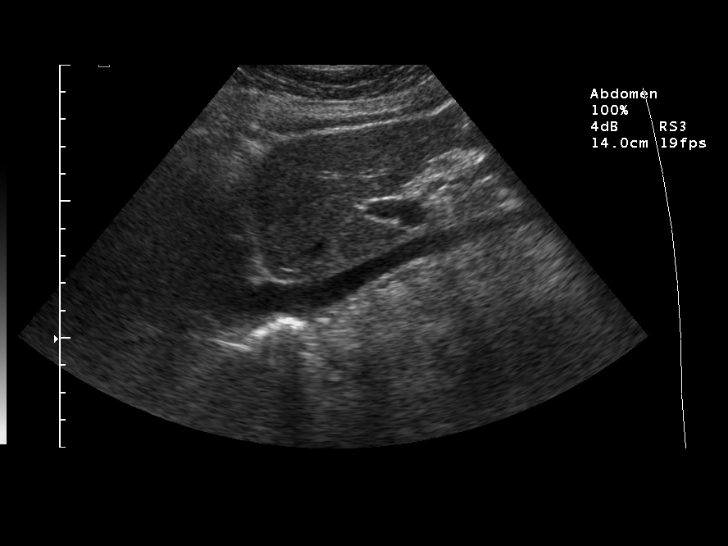
[im 4/45]
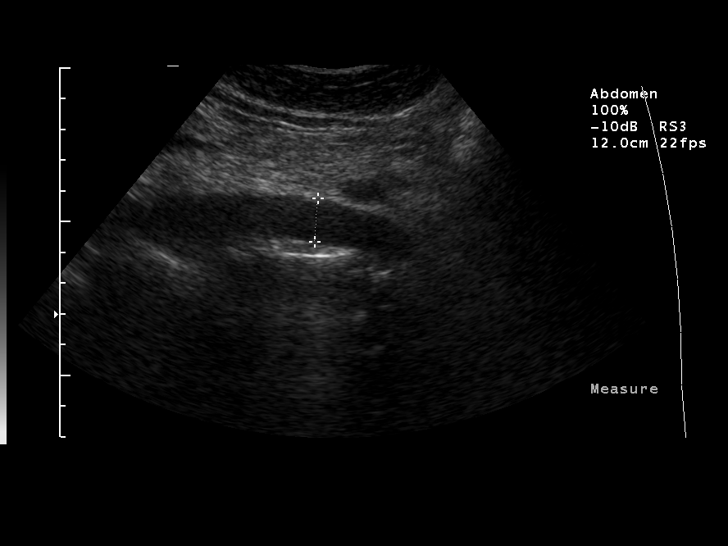
[im 8/45]
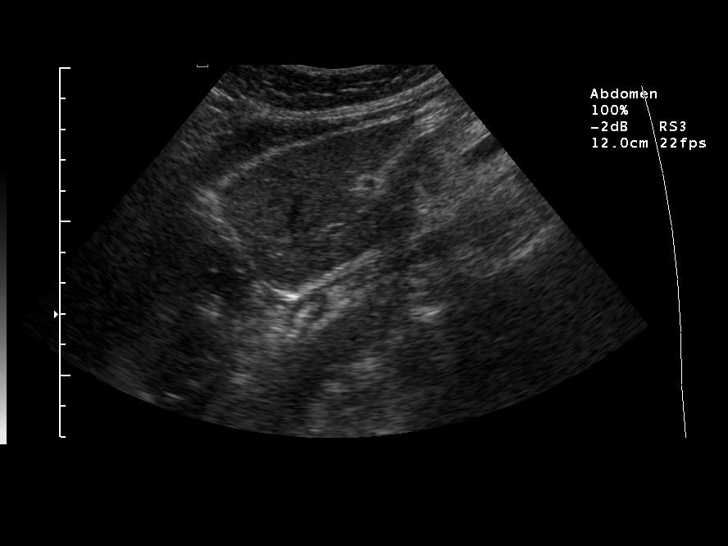
[im 12/45]
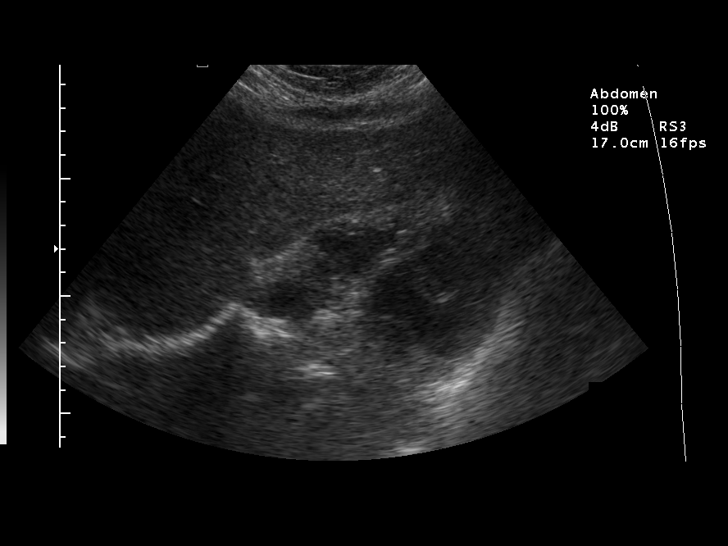
[im 15/45]
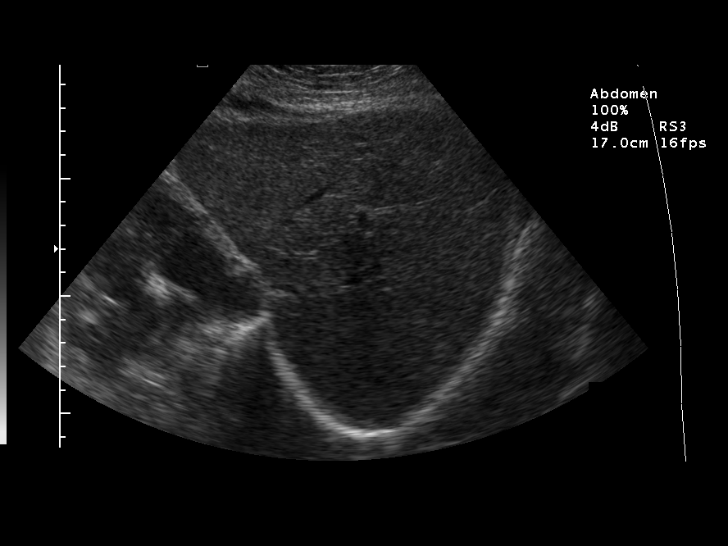
[im 17/45]
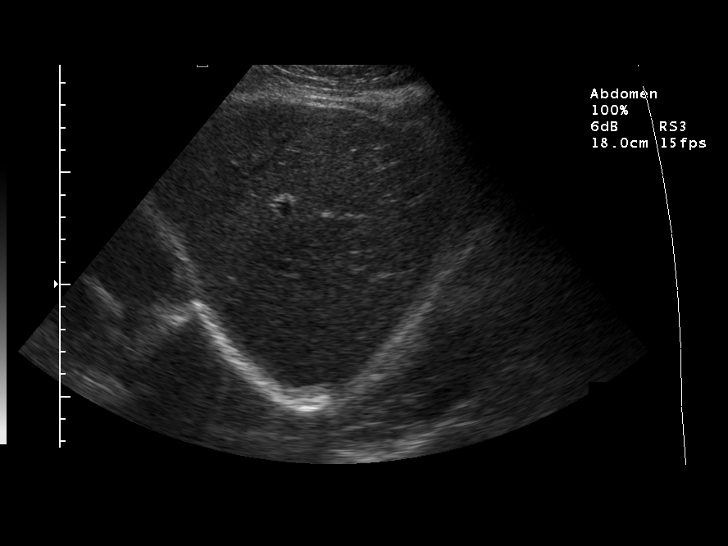
[im 21/45]
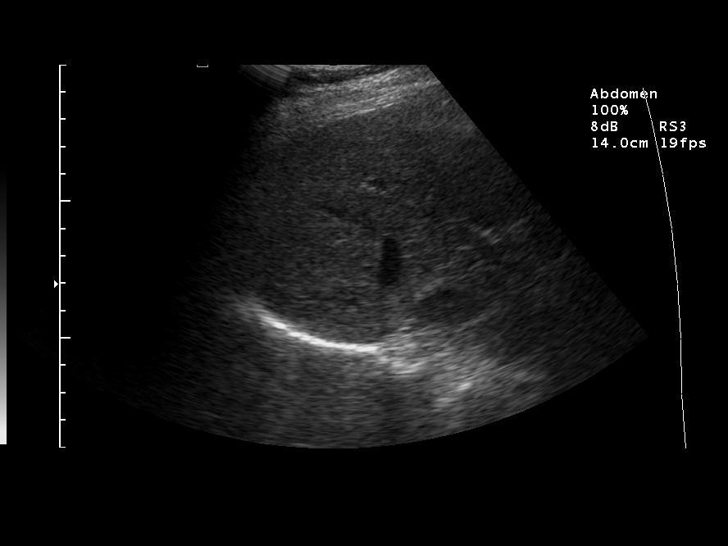
[im 24/45]
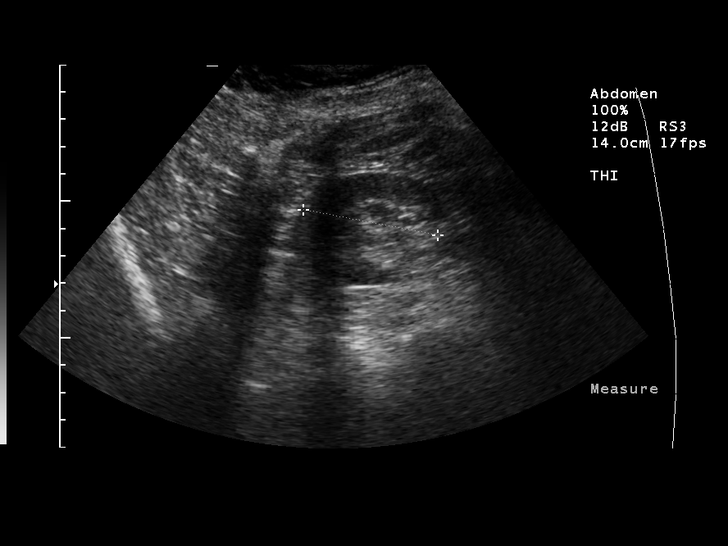
[im 28/45]
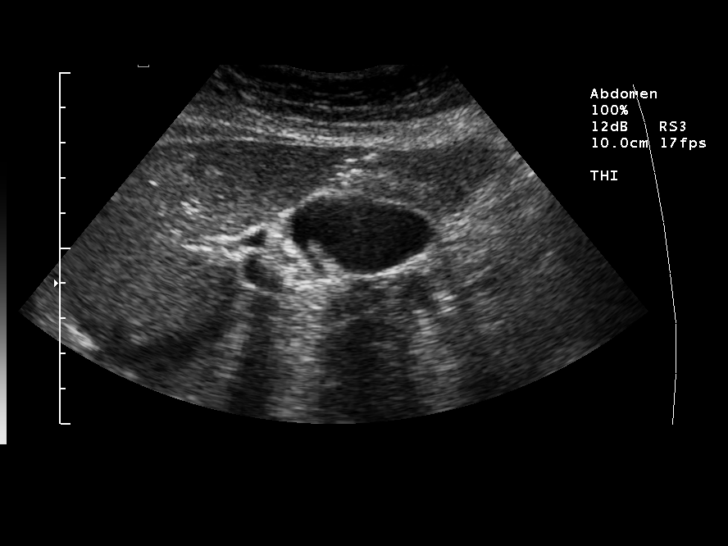
[im 30/45]
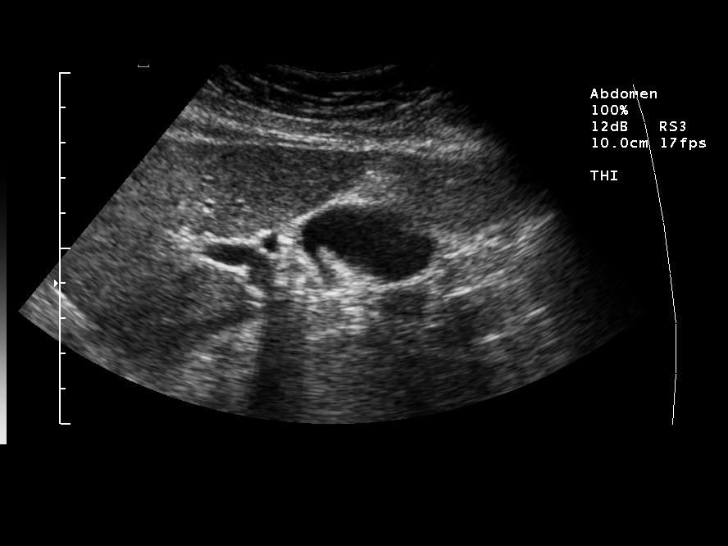
[im 34/45]
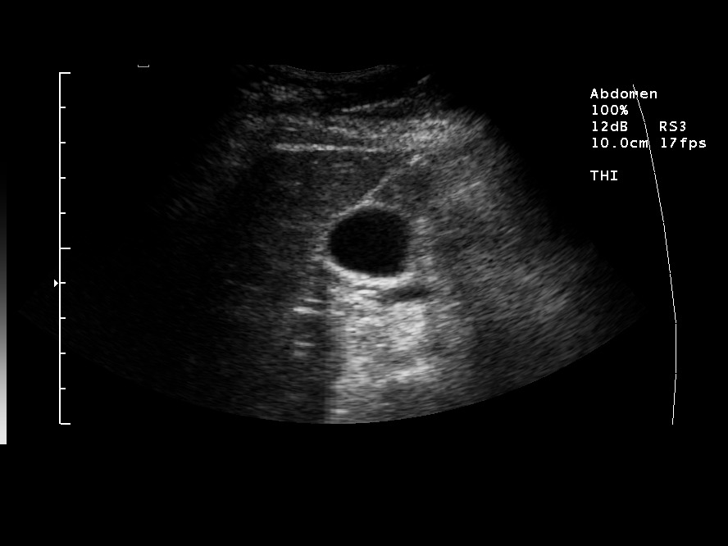
[im 37/45]
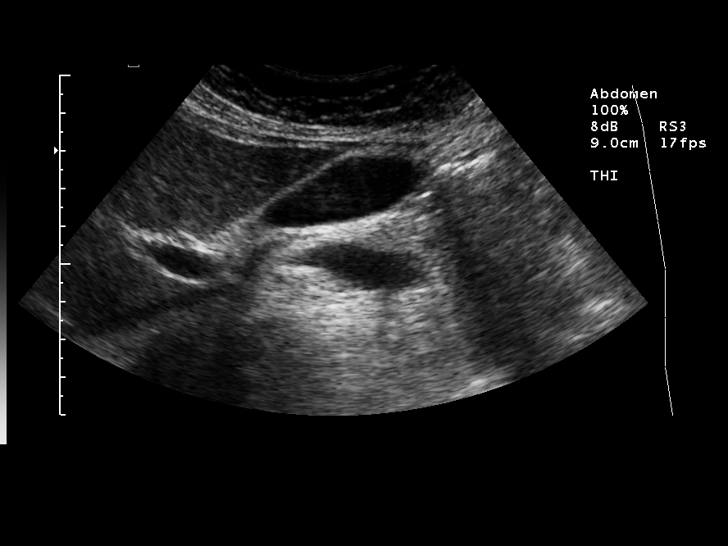
[im 41/45]
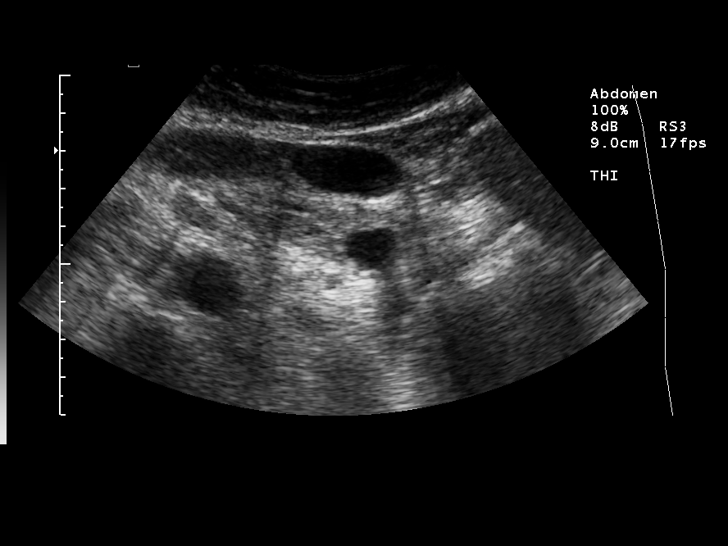
[im 45/45]
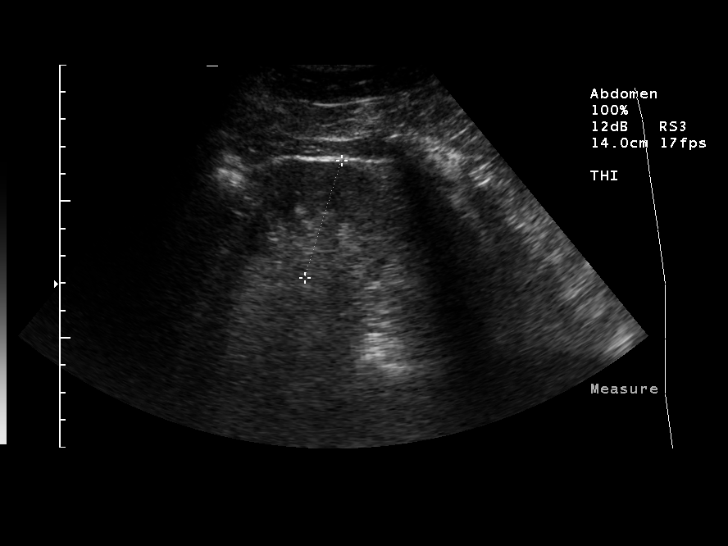

[14 of 25 positions shown; findings below may reference images not displayed]

FINDINGS: The liver is normal in size and echo appearance. The 
gallbladder is normal in appearance without stones present. The 
common duct is non-dilated measuring 0.44 cm. The pancreas is 
normal in appearance. The aorta is normal in caliber and echo 
appearance with AP measurements of 1.8 cm proximally, mid 1.6 cm, 
and distally 1.4 cm. Both kidneys are normal in size and echo 
appearance. The right kidney measures 10 cm in length, and the 
left kidney measures 9 cm in length. The spleen measures 8 cm with 
a normal echo pattern.
IMPRESSION: Negative ultrasound exam of the abdomen. Alia Tiger 
Adilson Eduardo, M.D. electronically reviewed on 07/06/2007 Dict Date: 
07/06/2007  Tran Date: 07/06/2007 CAV  [REDACTED]

## 2007-07-25 ENCOUNTER — Ambulatory Visit: Payer: Self-pay | Admitting: Thoracic Surgery

## 2007-07-25 ENCOUNTER — Encounter: Admission: RE | Admit: 2007-07-25 | Discharge: 2007-07-25 | Payer: Self-pay | Admitting: Thoracic Surgery

## 2007-07-25 IMAGING — CT CT CHEST W/O CM
2 of 4 series · 15 of 36 positions shown, 18 images · non-contrast
Comparison: Chest CT 01/11/2007.

CLINICAL DATA: Lung cancer, surgery [DATE].

CT CHEST WITHOUT CONTRAST
TECHNIQUE: Multidetector CT imaging of the chest was performed
following the standard protocol without IV contrast.

[Series 3: routine chest · axial · 0.65mm/px · z∈[-273,+12]mm · 12 of 67 slices shown, 15 images]
[im 5/67  mediastinal]
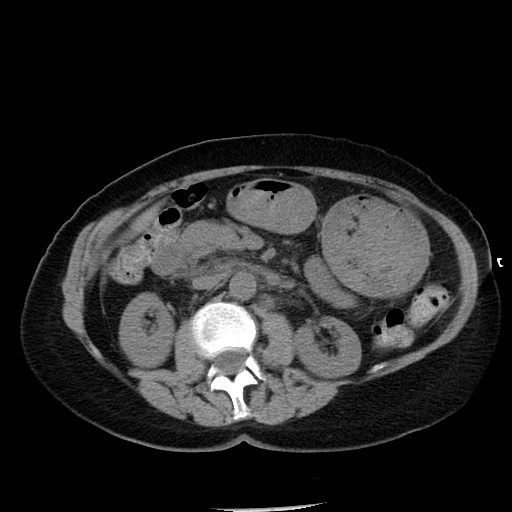
[im 5/67  lung]
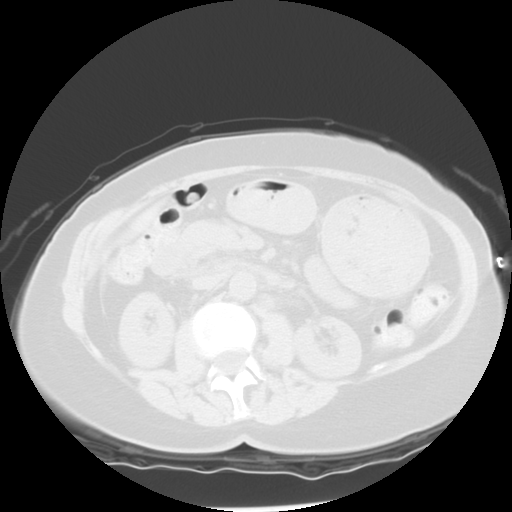
[im 9/67  lung]
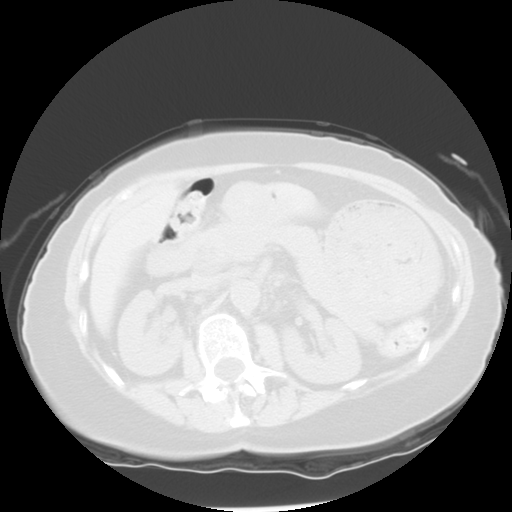
[im 14/67  lung]
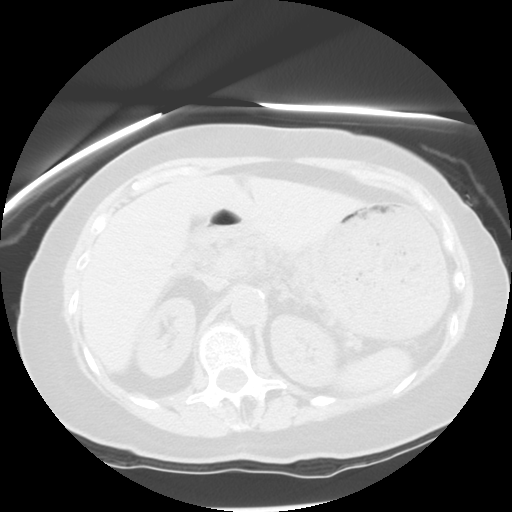
[im 23/67  lung]
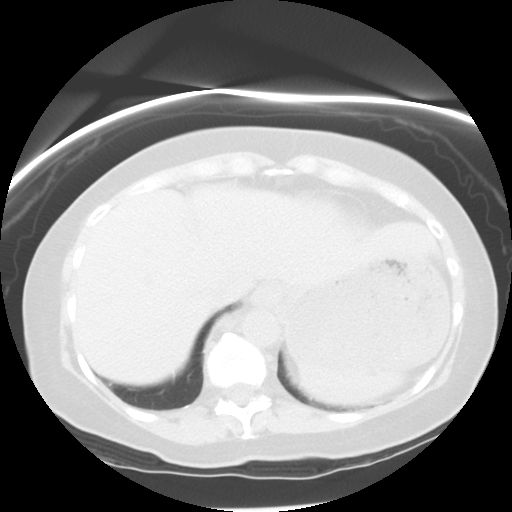
[im 27/67  mediastinal]
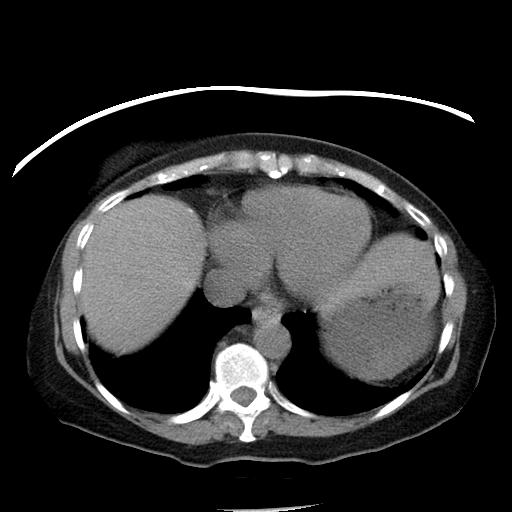
[im 27/67  lung]
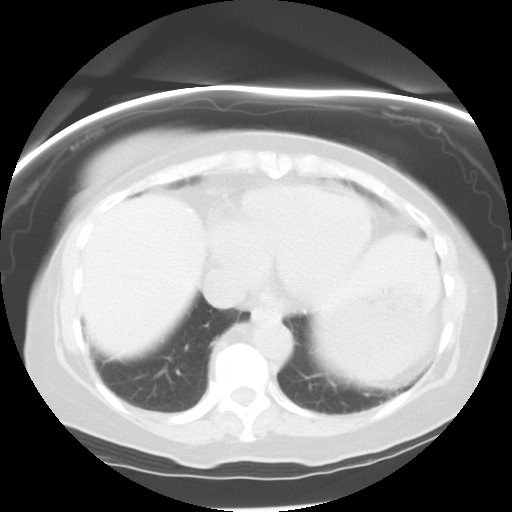
[im 31/67  lung]
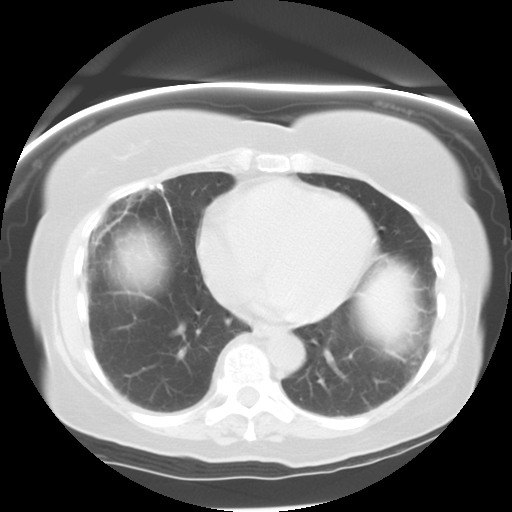
[im 36/67  lung]
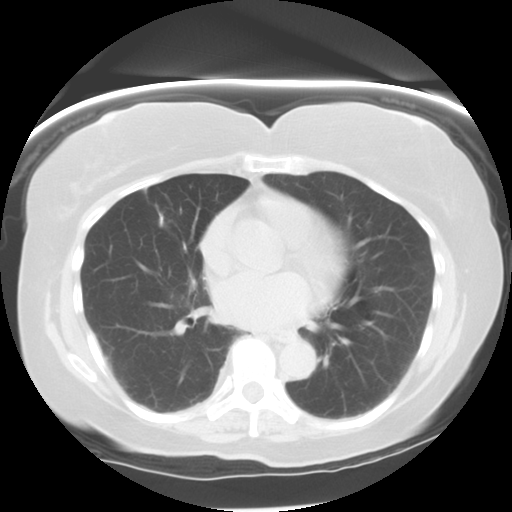
[im 40/67  lung]
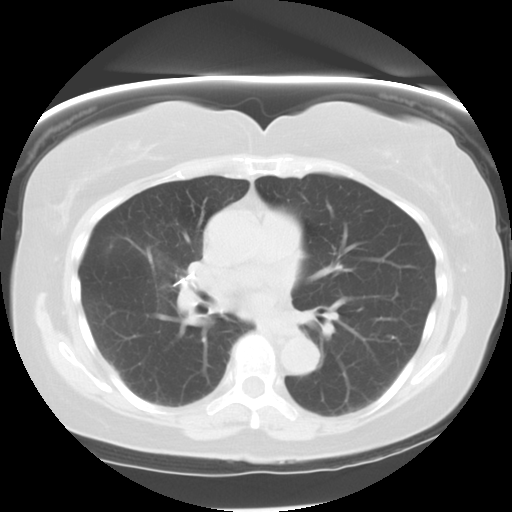
[im 45/67  mediastinal]
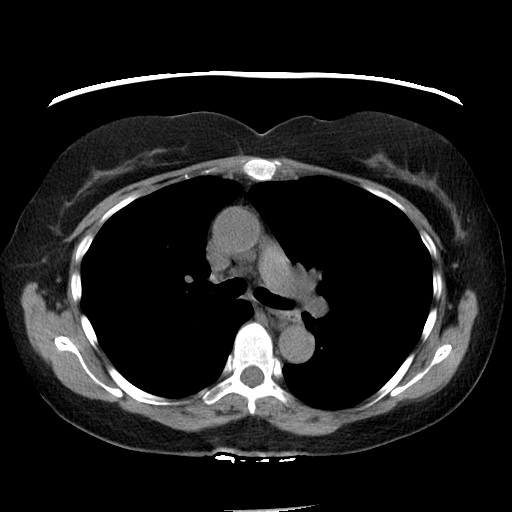
[im 45/67  lung]
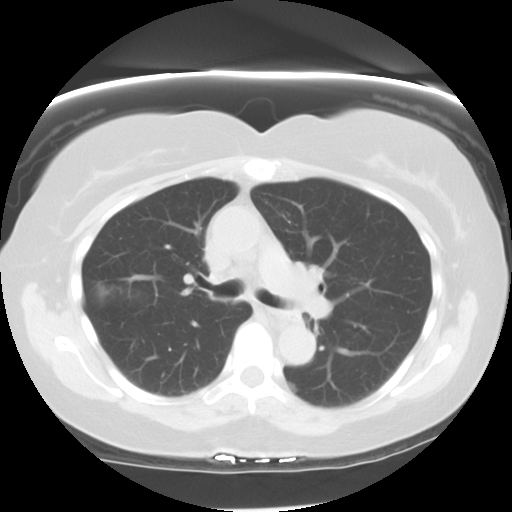
[im 53/67  lung]
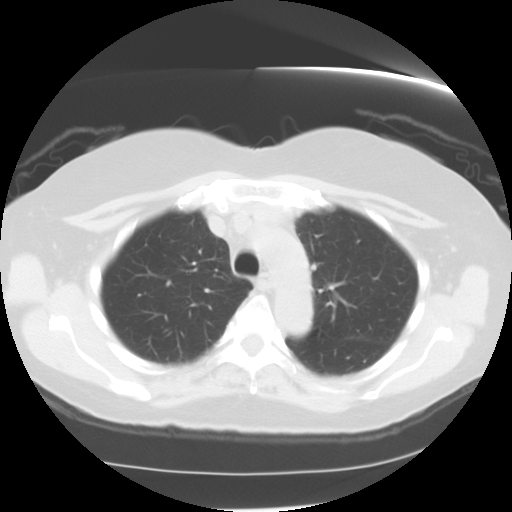
[im 58/67  lung]
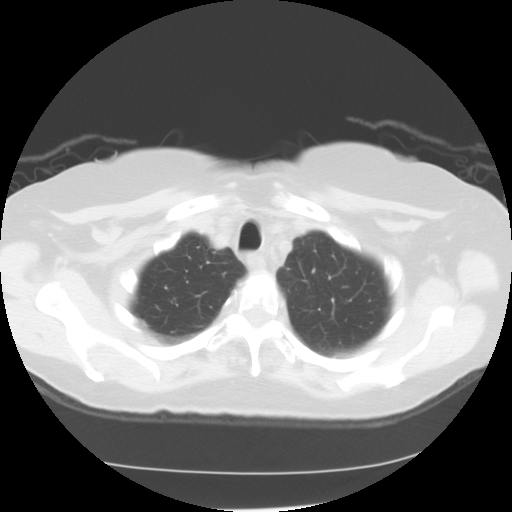
[im 62/67  lung]
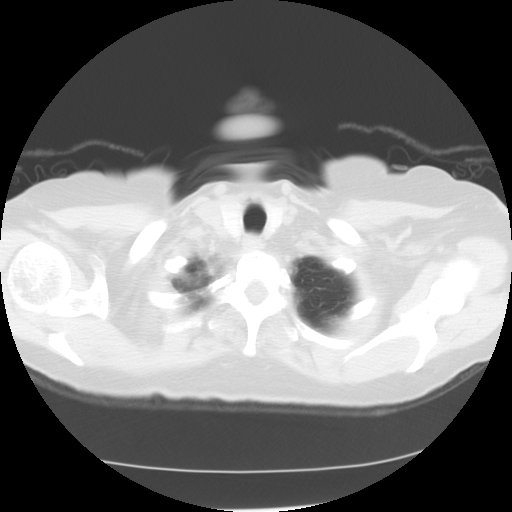

[Series 602: sagittal body · sagittal · 0.65mm/px · 3 of 134 slices shown]
[im 27/134  lung]
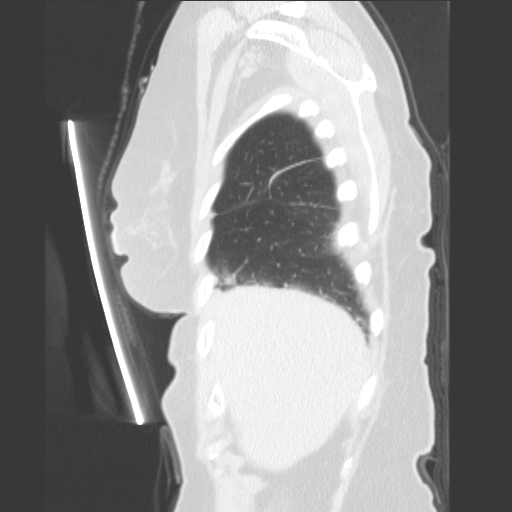
[im 54/134  lung]
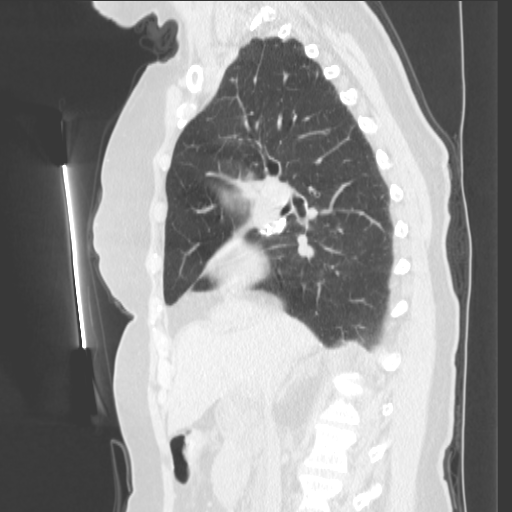
[im 80/134  lung]
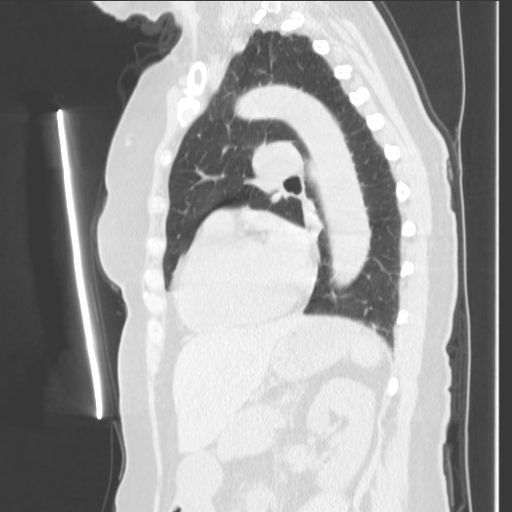

[15 of 36 positions shown; findings below may reference images not displayed]

FINDINGS: No axillary lymphadenopathy.  Prominent superior
pericardial recess.  Incidental imaging of the abdomen shows normal
adrenal glands bilaterally.  Some mild mesenteric stranding is
present at the root of the small bowel mesentery, nonspecific, and
this was likely present on the prior CT.  Cholecystectomy. The
pancreatic lymph node identified on prior contrasted CT of
03/30/2006 appears unchanged based on noncontrast technique.

Surgical clips identified in the region of the right hilum and
staples consistent with right resection of middle lobe nodule.  The
lungs appear clear.  Retrocrural lymph nodes appear unchanged
compared to prior examination.

No mediastinal or hilar adenopathy is identified.  Aorta and branch
vessel atherosclerosis.  Bone windows appear within normal limits
with lumbar scoliosis.
IMPRESSION: 1.  Postsurgical changes of resection of right middle lobe
pulmonary nodule, with residual scarring.  No evidence of recurrent
or residual disease.
2.  No change in prominent but not pathologically enlarged
retrocrural and peripancreatic lymph node.

## 2008-01-31 ENCOUNTER — Ambulatory Visit: Payer: Self-pay | Admitting: Thoracic Surgery

## 2008-01-31 ENCOUNTER — Encounter: Admission: RE | Admit: 2008-01-31 | Discharge: 2008-01-31 | Payer: Self-pay | Admitting: Thoracic Surgery

## 2008-01-31 IMAGING — CR DG CHEST 2V
2 series · 2 of 2 positions shown · non-contrast
Comparison: 07/25/2007

CLINICAL DATA: Status post lung cancer resection.

CHEST - 2 VIEW

[view not recorded (1 of 2)]
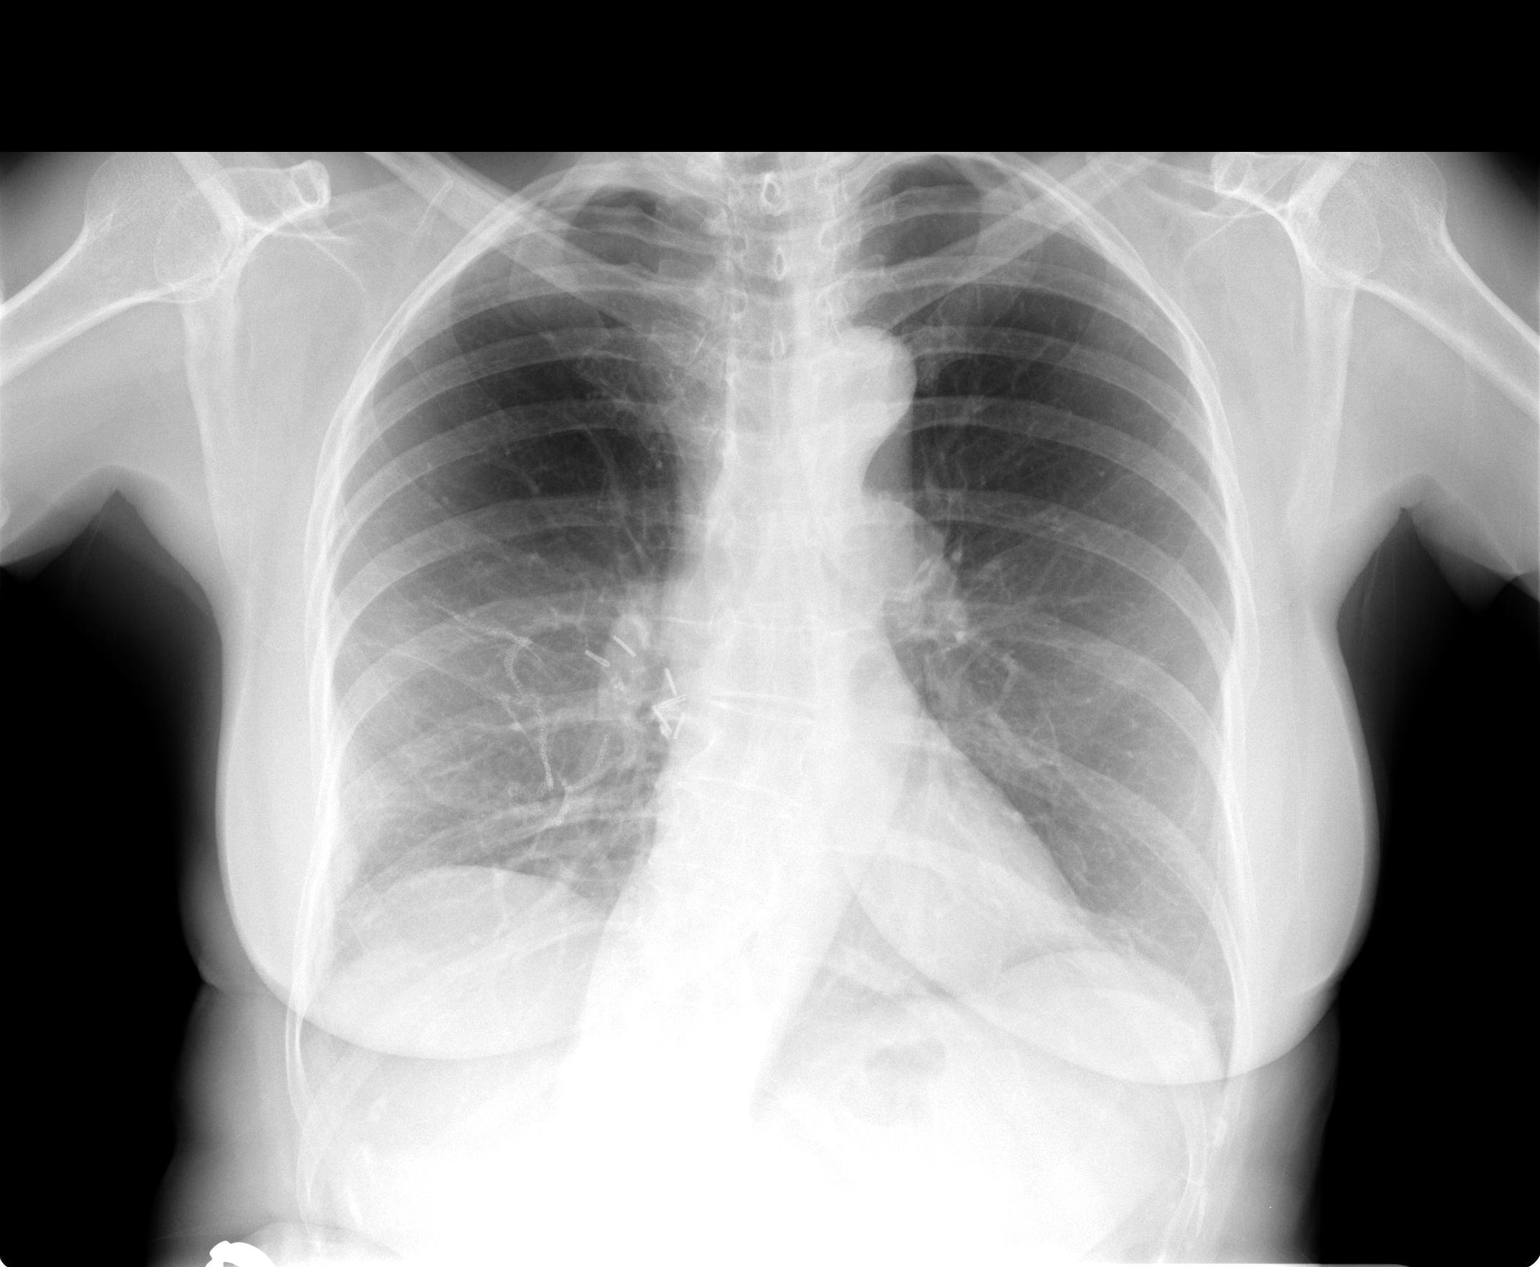

[view not recorded (2 of 2)]
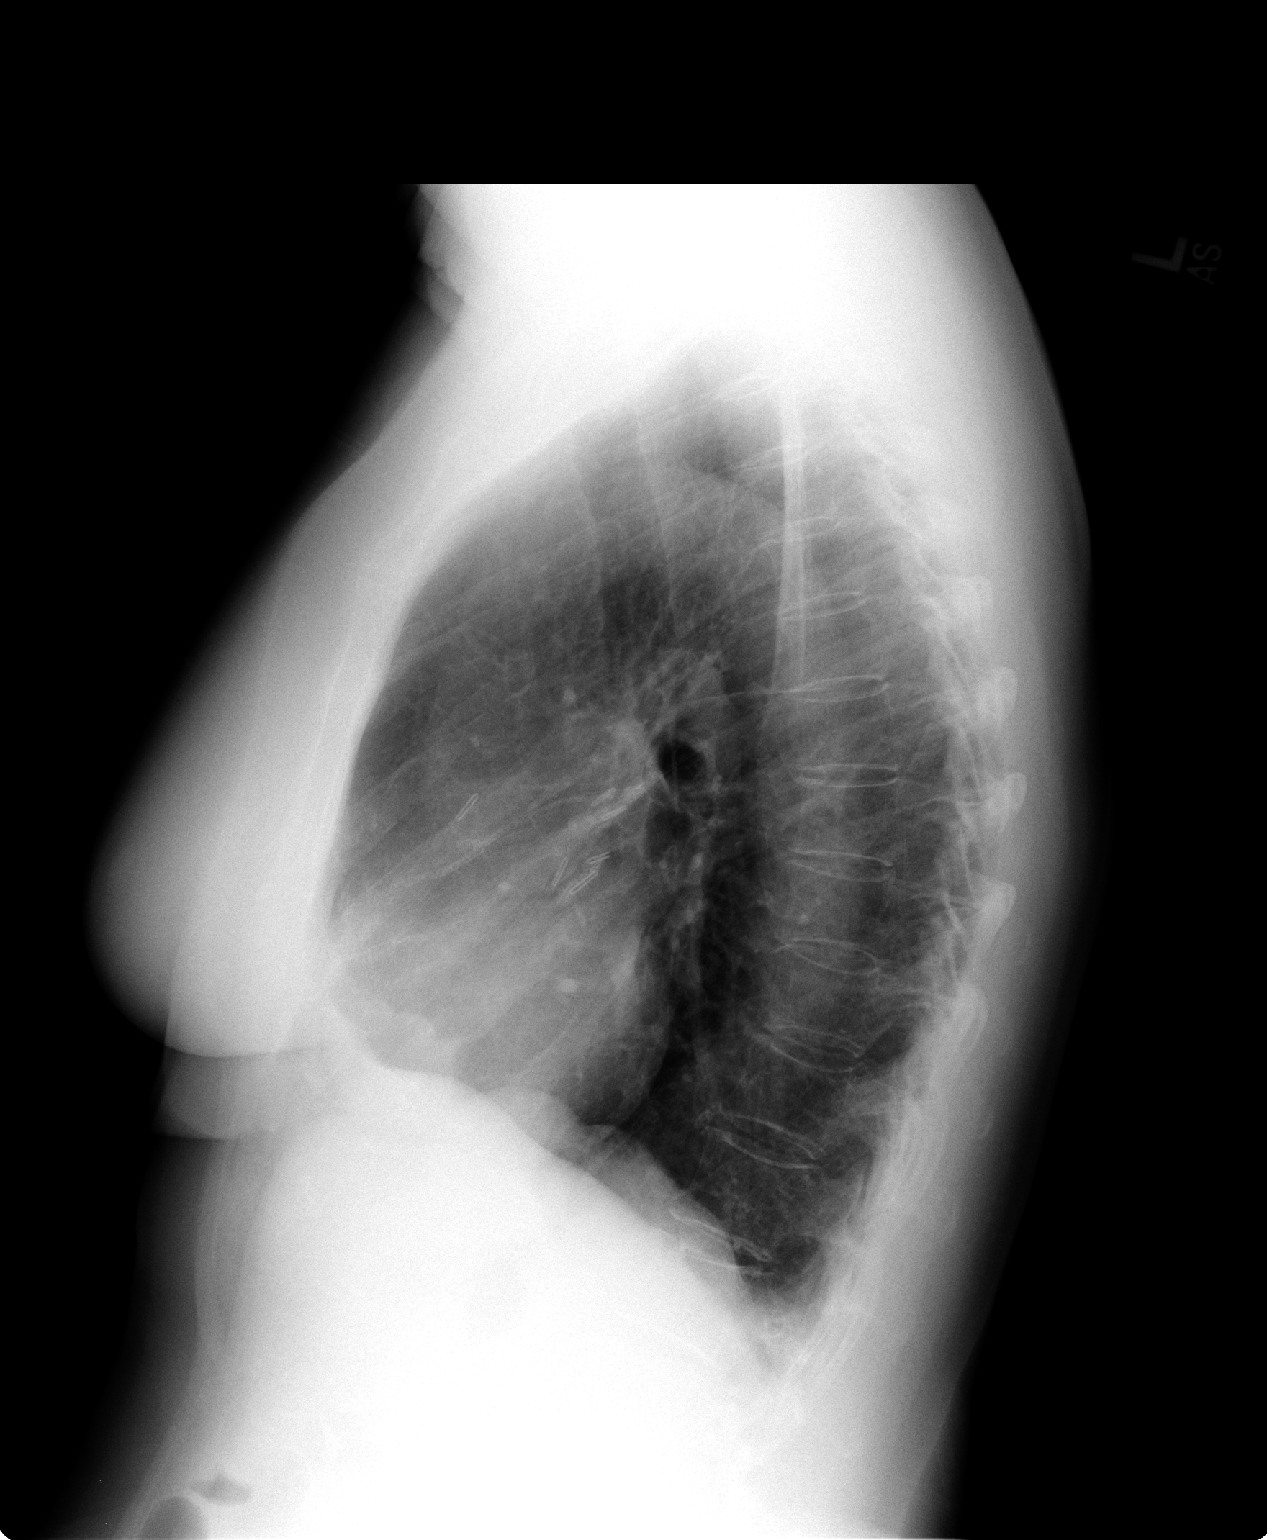

[2 of 2 positions shown; findings below may reference images not displayed]

FINDINGS: Heart size is normal.

No pleural effusion or pulmonary interstitial edema.

Postoperative change in volume loss at the right base is again
noted.

There is associated volume loss.

The appearance is unchanged from prior exam.

No specific features are identified to suggest residual or
recurrent tumor.
IMPRESSION: 1.  Stable radiograph of the chest without specific features to
suggest residual or recurrence of tumor

## 2008-07-18 ENCOUNTER — Other Ambulatory Visit: Admission: RE | Admit: 2008-07-18 | Discharge: 2008-07-18 | Payer: Self-pay | Admitting: Internal Medicine

## 2008-08-14 ENCOUNTER — Ambulatory Visit: Payer: Self-pay | Admitting: Thoracic Surgery

## 2008-08-14 ENCOUNTER — Encounter: Admission: RE | Admit: 2008-08-14 | Discharge: 2008-08-14 | Payer: Self-pay | Admitting: Thoracic Surgery

## 2008-08-14 IMAGING — CT CT CHEST W/O CM
3 of 4 series · 17 of 30 positions shown, 19 images · non-contrast
Comparison: Chest x-ray 01/31/2008 and CT chest 07/25/2007

CLINICAL DATA: Lung cancer resection.  Cough.  Shortness of breath
with exertion.

CT CHEST WITHOUT CONTRAST
TECHNIQUE: Multidetector CT imaging of the chest was performed
following the standard protocol without IV contrast.

[Series 3: routine chest · axial · 0.59mm/px · z∈[-262,-82]mm · 5 of 55 slices shown, 7 images]
[im 10/55  mediastinal]
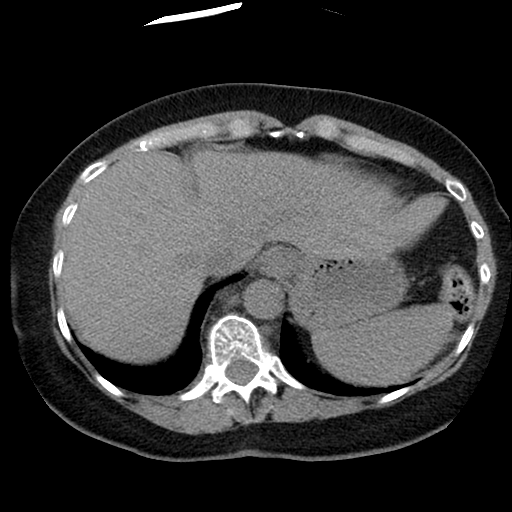
[im 10/55  lung]
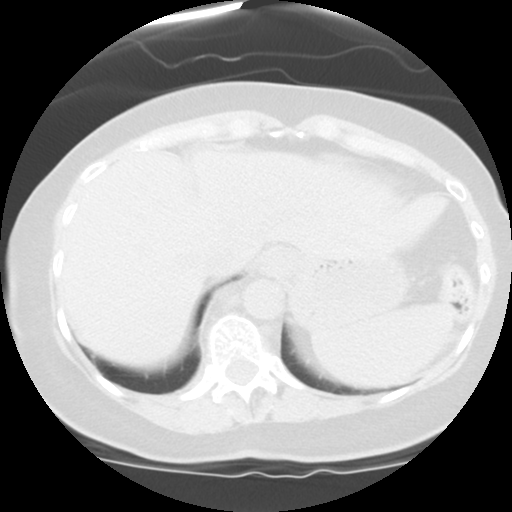
[im 19/55  lung]
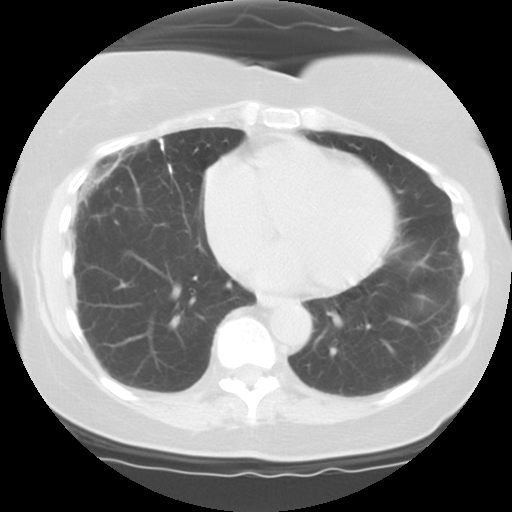
[im 28/55  lung]
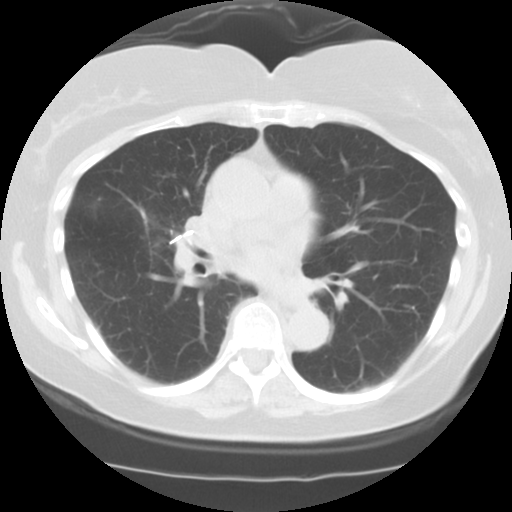
[im 37/55  lung]
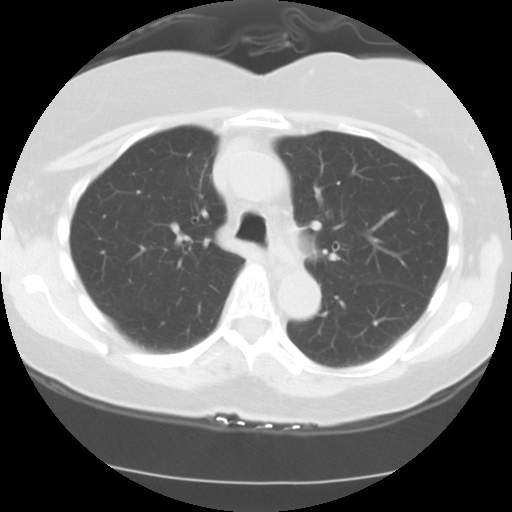
[im 46/55  mediastinal]
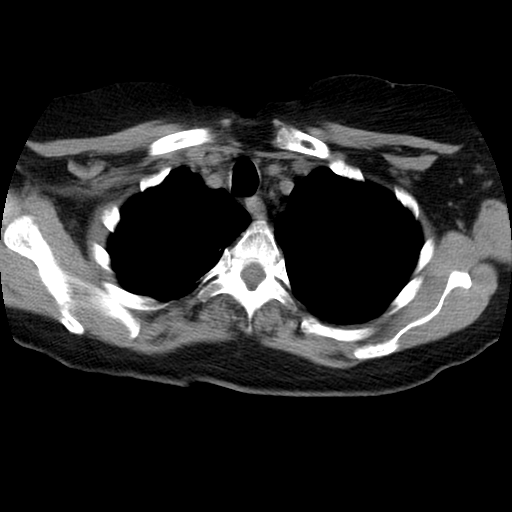
[im 46/55  lung]
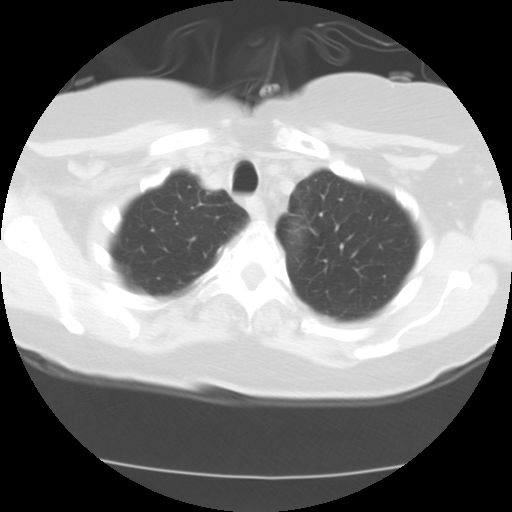

[Series 4: lung windows · axial · 0.55mm/px · z∈[-236,-86]mm · 4 of 51 slices shown]
[im 11/51  lung]
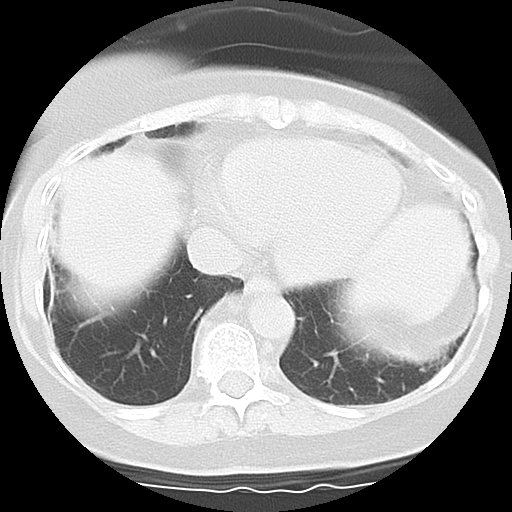
[im 21/51  lung]
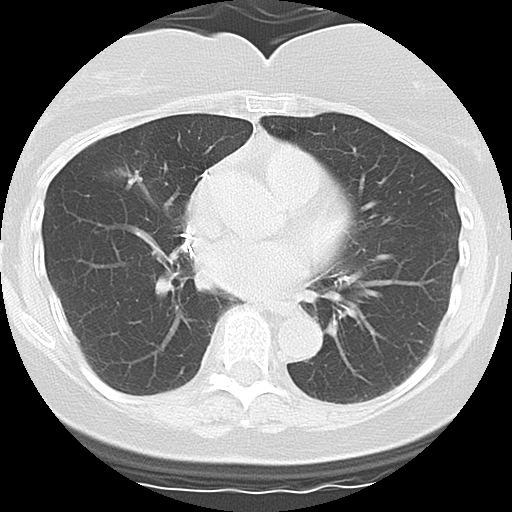
[im 31/51  lung]
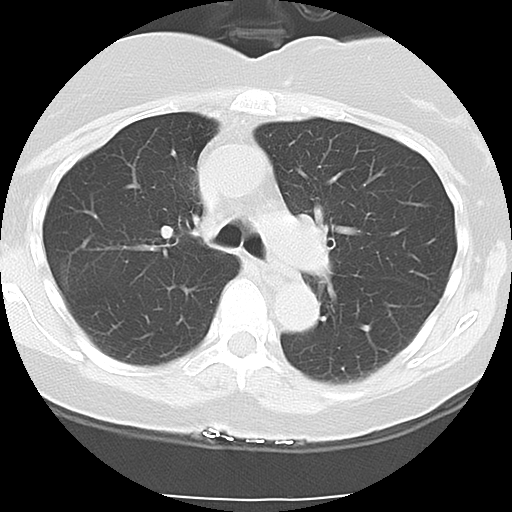
[im 41/51  lung]
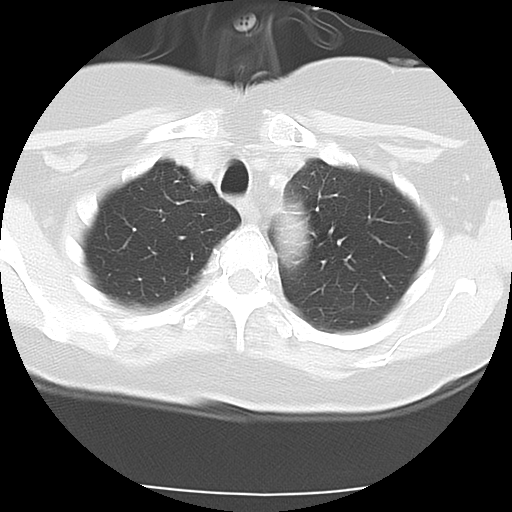

[Series 602: sagittal body · sagittal · 0.59mm/px · 8 of 121 slices shown]
[im 9/121  mediastinal]
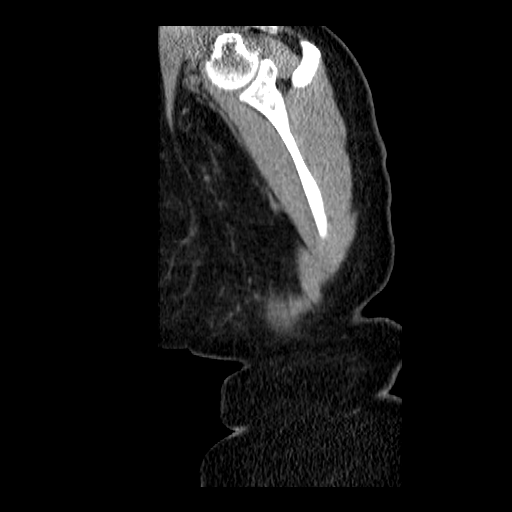
[im 26/121  mediastinal]
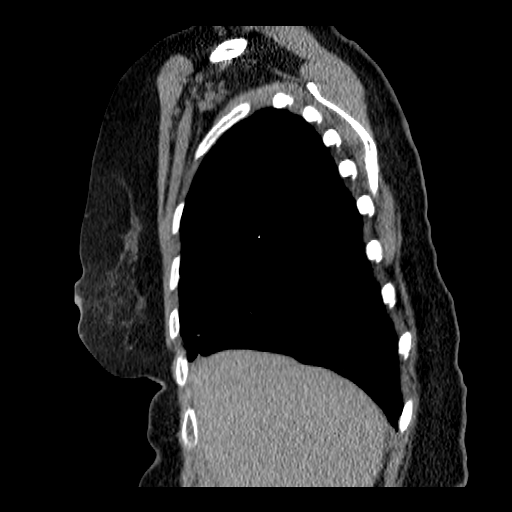
[im 43/121  mediastinal]
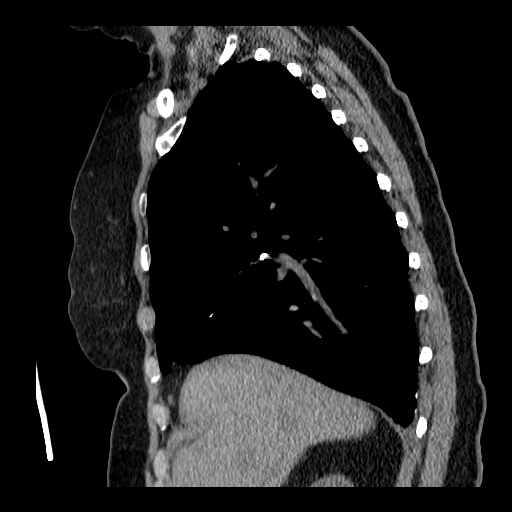
[im 52/121  mediastinal]
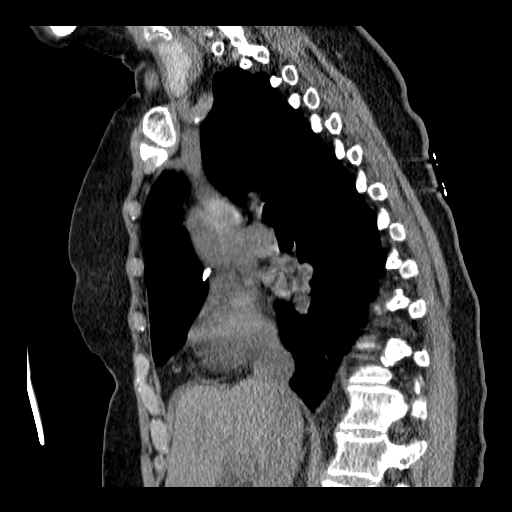
[im 69/121  mediastinal]
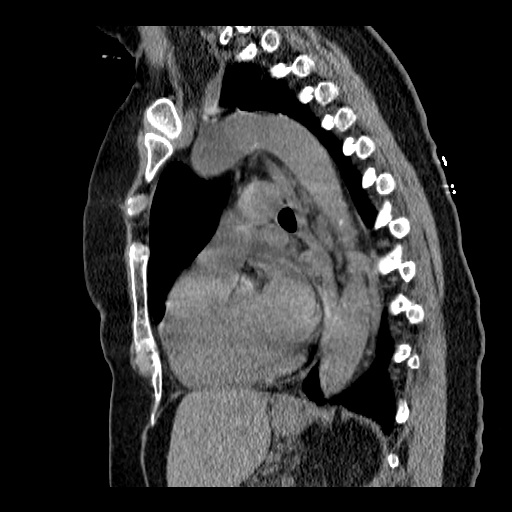
[im 78/121  mediastinal]
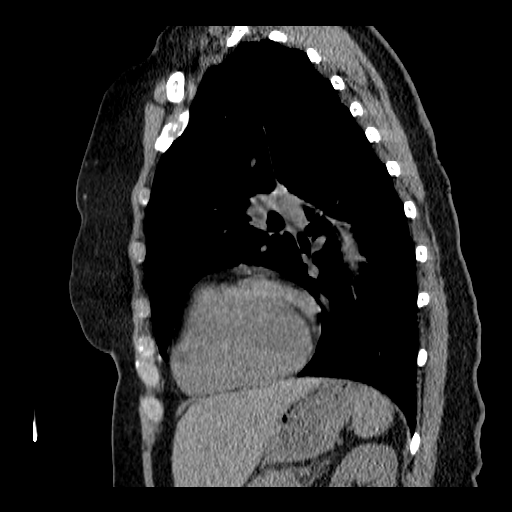
[im 95/121  mediastinal]
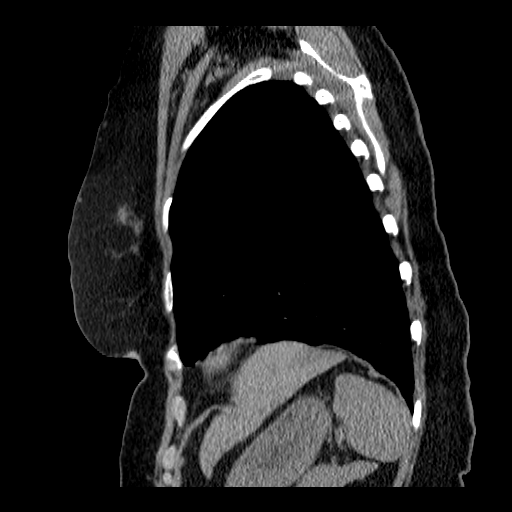
[im 112/121  mediastinal]
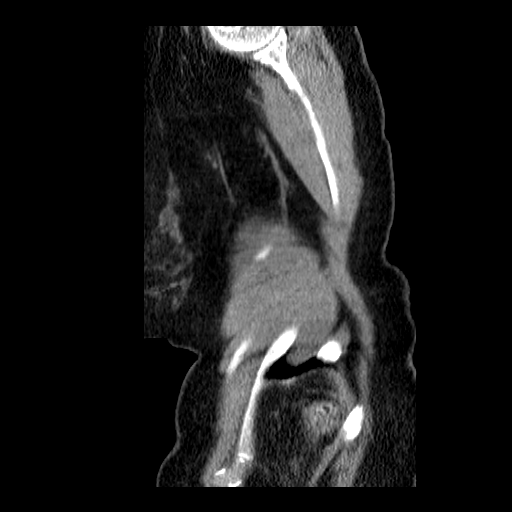

[17 of 30 positions shown; findings below may reference images not displayed]

FINDINGS: No pathologically enlarged mediastinal, hilar or axillary
lymph nodes.  Heart size normal.  No pericardial effusion.  There
are small lymph nodes adjacent to the descending thoracic aorta,
stable.

The patient is status post right middle lobectomy.  Subpleural
scarring is seen in the right lower lobe.  No pleural fluid.
Airway is otherwise unremarkable.

Incidental imaging of the upper abdomen shows no acute findings.
No worrisome lytic or sclerotic lesions.
IMPRESSION: Postoperative changes of right middle lobectomy without recurrent
or metastatic disease.

## 2009-07-03 ENCOUNTER — Encounter (INDEPENDENT_AMBULATORY_CARE_PROVIDER_SITE_OTHER): Payer: Self-pay | Admitting: *Deleted

## 2009-08-20 ENCOUNTER — Ambulatory Visit: Payer: Self-pay | Admitting: Thoracic Surgery

## 2009-08-20 ENCOUNTER — Encounter: Admission: RE | Admit: 2009-08-20 | Discharge: 2009-08-20 | Payer: Self-pay | Admitting: Thoracic Surgery

## 2009-08-20 IMAGING — CT CT CHEST W/O CM
3 of 4 series · 17 of 30 positions shown, 19 images · non-contrast
Comparison: CT chest of 08/14/2008

CLINICAL DATA: History of right lung carcinoma with right middle
lobectomy in May 2006, follow-up

CT CHEST WITHOUT CONTRAST
TECHNIQUE: Multidetector CT imaging of the chest was performed
following the standard protocol without IV contrast.

[Series 3: routine chest · axial · 0.69mm/px · z∈[-282,-52]mm · 5 of 70 slices shown, 7 images]
[im 12/70  mediastinal]
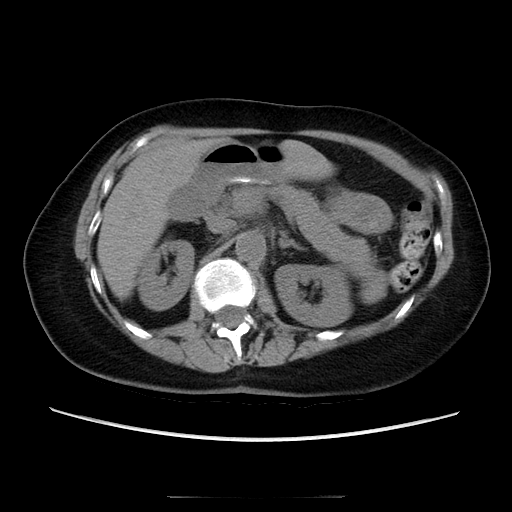
[im 12/70  lung]
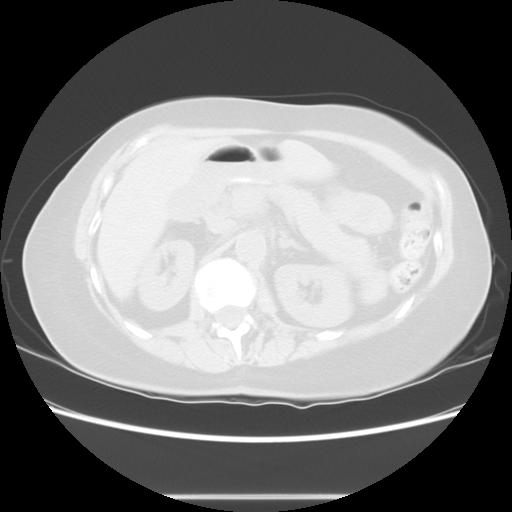
[im 24/70  lung]
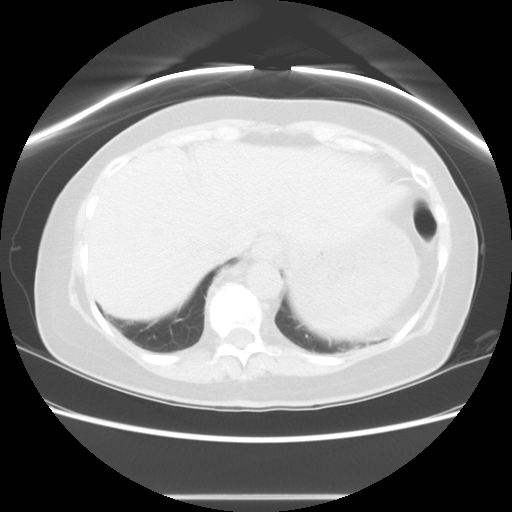
[im 35/70  lung]
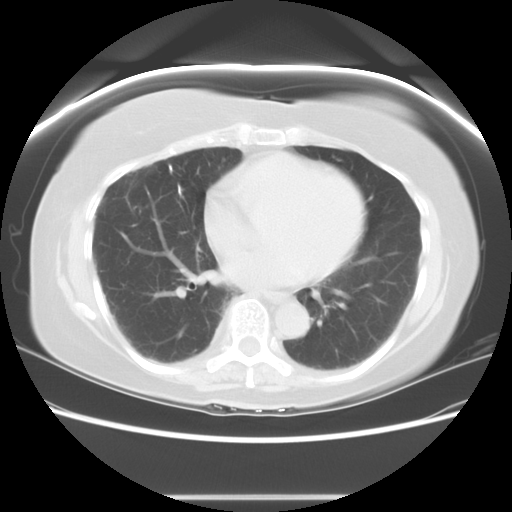
[im 47/70  lung]
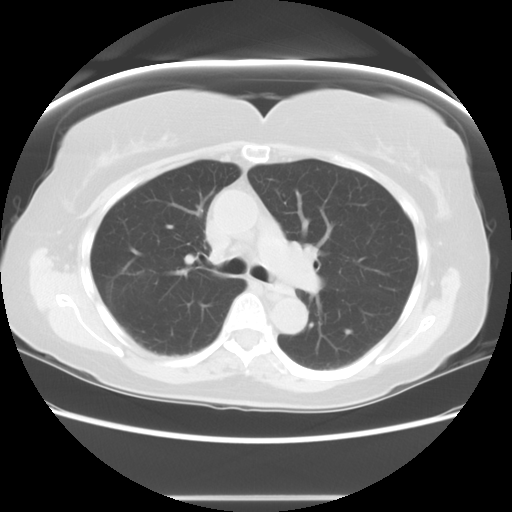
[im 58/70  mediastinal]
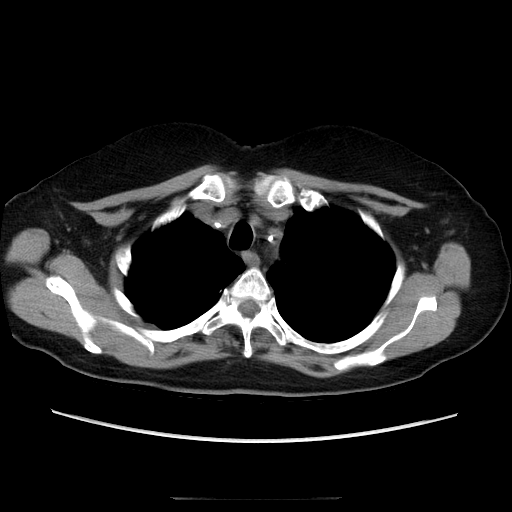
[im 58/70  lung]
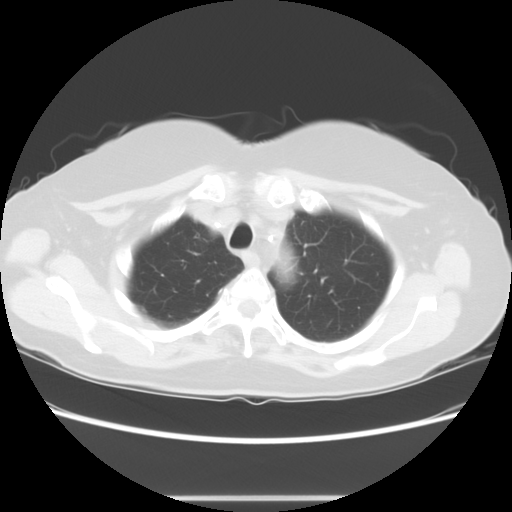

[Series 4: lung windows · axial · 0.69mm/px · z∈[-202,-47]mm · 4 of 53 slices shown]
[im 11/53  lung]
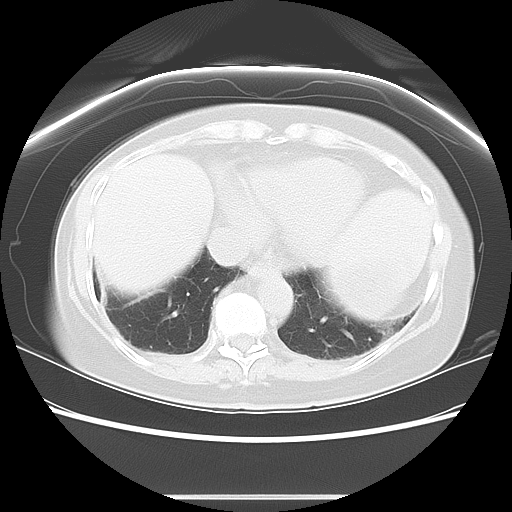
[im 21/53  lung]
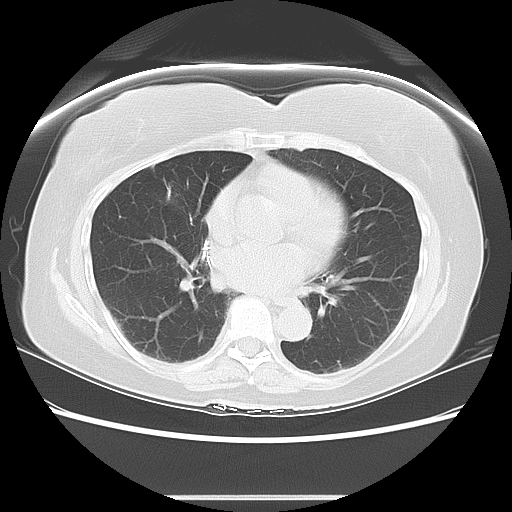
[im 32/53  lung]
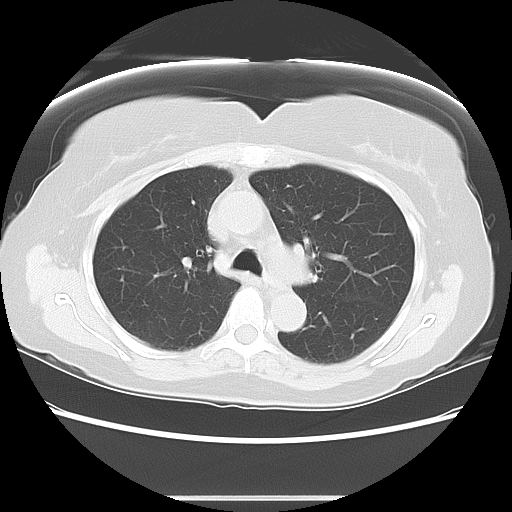
[im 42/53  lung]
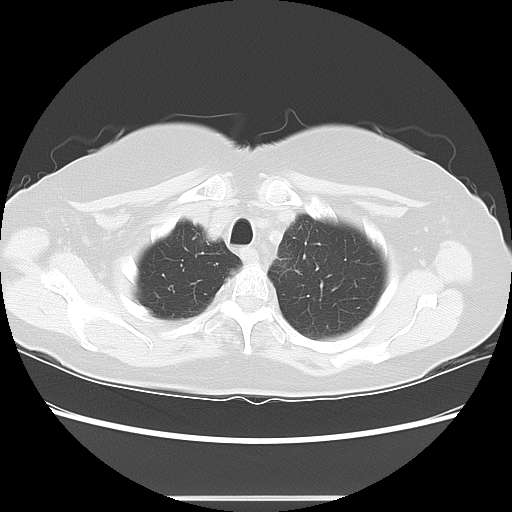

[Series 602: sagittal body · sagittal · 0.69mm/px · 8 of 143 slices shown]
[im 11/143  mediastinal]
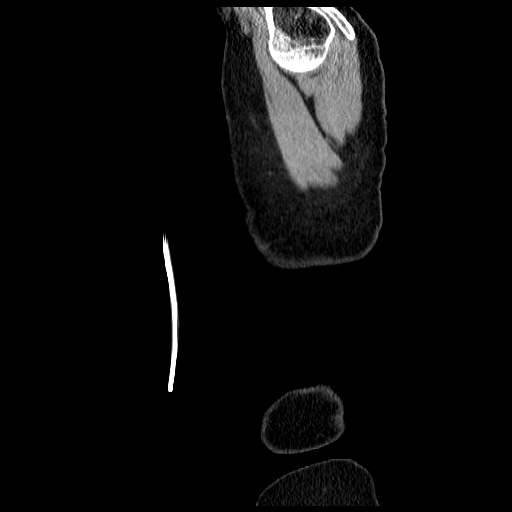
[im 31/143  mediastinal]
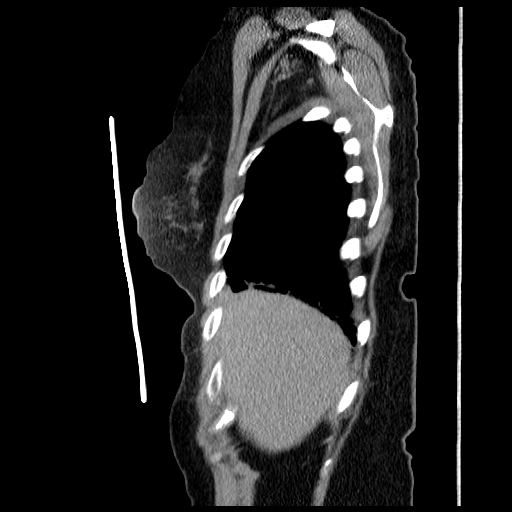
[im 51/143  mediastinal]
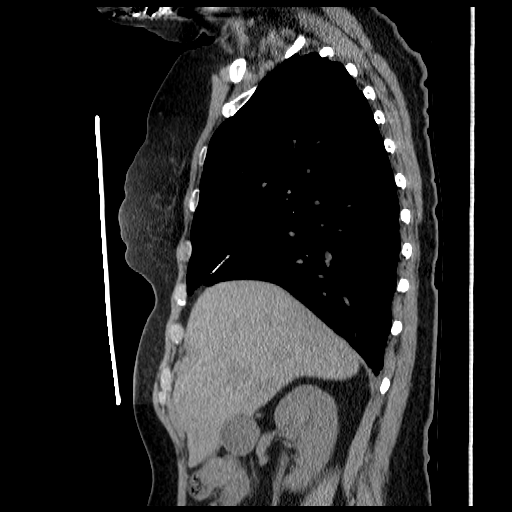
[im 61/143  mediastinal]
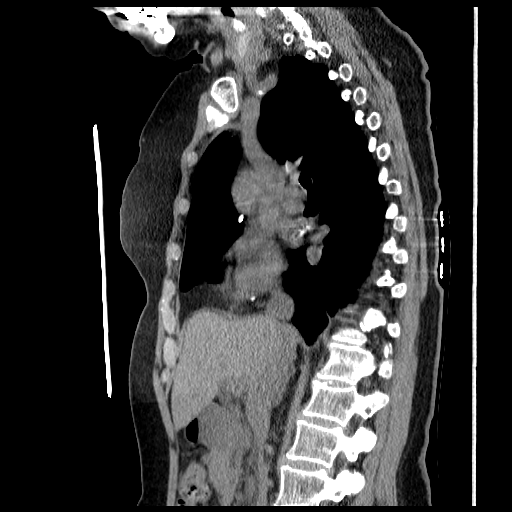
[im 82/143  mediastinal]
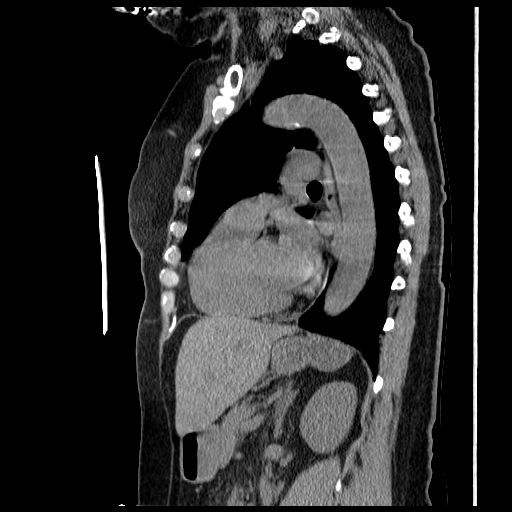
[im 92/143  mediastinal]
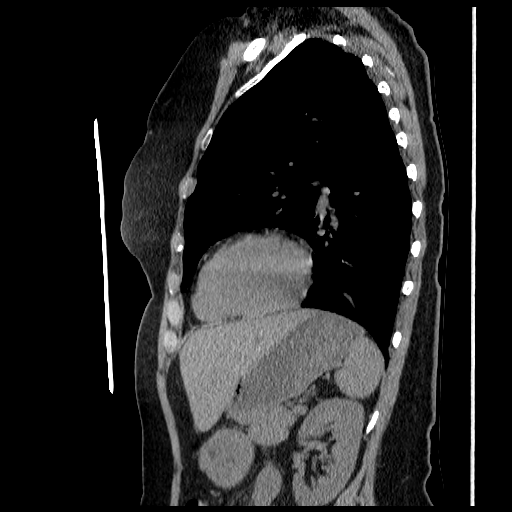
[im 112/143  mediastinal]
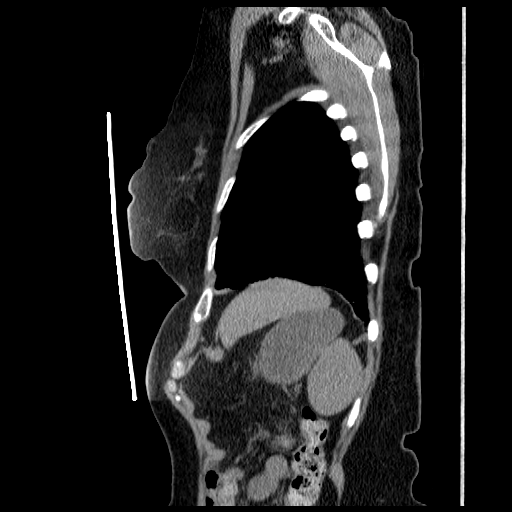
[im 132/143  mediastinal]
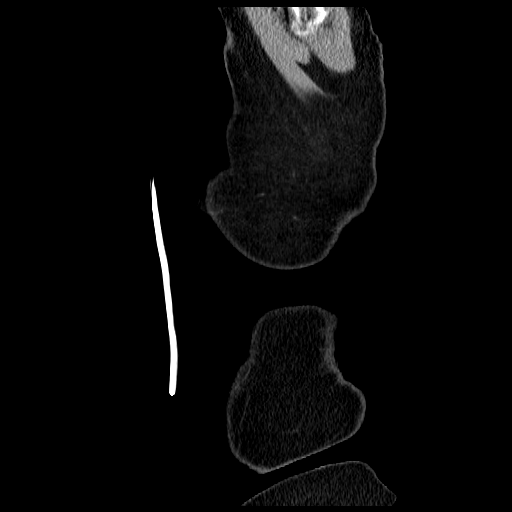

[17 of 30 positions shown; findings below may reference images not displayed]

FINDINGS: Postop changes are noted with surgical clips in the right
infrahilar region.  Minimal scarring in the right anterior right
lung base is stable.  No new lung nodule is seen.  No pleural
effusion is noted.

On soft tissue window images no mediastinal or hilar adenopathy is
seen.  The heart is mildly enlarged and stable.  No abnormality of
the upper abdomen is noted.  Probable slight prominence of
vasculature is noted within the very last scans through the upper
abdomen of questionable significance.
IMPRESSION: No evidence of recurrent or metastatic disease.  Stable postop
changes on the right.

## 2010-05-09 ENCOUNTER — Encounter: Payer: Self-pay | Admitting: Cardiology

## 2010-05-10 ENCOUNTER — Encounter: Payer: Self-pay | Admitting: *Deleted

## 2010-05-12 ENCOUNTER — Other Ambulatory Visit: Payer: Self-pay | Admitting: Dermatology

## 2010-05-19 NOTE — Letter (Signed)
Summary: Colonoscopy Date Change Letter  [facility]  [address] [place], [place] [address]   Phone: [phone]  Fax: [phone]      July 03, 2009 MRN: [id number]   [name] [name] [address] [place], [place]  [address]   Dear [name],   Previously you were recommended to have a repeat colonoscopy around this time. Your chart was recently reviewed by [facility]. Follow up colonoscopy is now recommended in 06-2014. This revised recommendation is based on current, nationally recognized guidelines for colorectal cancer screening and polyp surveillance. These guidelines are endorsed by the American Cancer Society, [employer] on Colorectal Cancer as well as numerous other major medical organizations.  Please understand that our recommendation assumes that you do not have any new symptoms such as bleeding, a change in bowel habits, anemia, or significant abdominal discomfort. If you do have any concerning GI symptoms or want to discuss the guideline recommendations, please call to arrange an office visit at your earliest convenience. Otherwise we will keep you in our reminder system and contact you 1-2 months prior to the date listed above to schedule your next colonoscopy.  Thank you,  Barbette Hair. Arlyce Dice, M.D.  [facility] [phone]

## 2010-08-24 ENCOUNTER — Other Ambulatory Visit: Payer: Self-pay | Admitting: Internal Medicine

## 2010-08-24 ENCOUNTER — Other Ambulatory Visit (HOSPITAL_COMMUNITY)
Admission: RE | Admit: 2010-08-24 | Discharge: 2010-08-24 | Disposition: A | Discharge status: Home / Self Care | Payer: Medicare Other | Source: Ambulatory Visit | Attending: Internal Medicine | Admitting: Internal Medicine

## 2010-08-24 DIAGNOSIS — E042 Nontoxic multinodular goiter: Secondary | ICD-10-CM

## 2010-08-24 DIAGNOSIS — Z124 Encounter for screening for malignant neoplasm of cervix: Secondary | ICD-10-CM | POA: Insufficient documentation

## 2010-08-24 DIAGNOSIS — Z1159 Encounter for screening for other viral diseases: Secondary | ICD-10-CM | POA: Insufficient documentation

## 2010-09-01 NOTE — Letter (Signed)
July 25, 2007   Soyla Murphy. Renne Crigler, M.D.  [address]  [place], [place] [address]   Re:  [place], [place]             DOB:   Dear [name]:   I saw Mrs. Ditto back today and her CT scan showed no evidence of  recurrence of her cancer.  Her blood pressure is 126/73, pulse 90,  respirations 18.  Saturations were 97%.  Lungs were clear to  auscultation and percussion.  I will see her back again in six months  with a chest x-ray and then a CT scan at one year.   Ines Bloomer, M.D.    DPB/MEDQ  D:  07/25/2007  T:  07/25/2007  Job:  [id number]

## 2010-09-01 NOTE — Letter (Signed)
August 14, 2008   Soyla Murphy. Renne Crigler, MD  [address]  [place], [place] [address]   Re:  [name] [name], [name] [name]             DOB:  1934-11-03   Dear [name],   I saw the patient back today, now 2 years since we did a right middle  lobectomy for carcinoid tumors.  CT scan showed no evidence of  recurrence.  Blood pressure is 134/80, pulse 90, respirations 18, and  saturations were 96%.  Overall, she is doing well.  I will plan to see  her back again in a year with another CT scan.   Ines Bloomer, M.D.  Electronically Signed   DPB/MEDQ  D:  08/14/2008  T:  08/15/2008  Job:  [id number]

## 2010-09-01 NOTE — Assessment & Plan Note (Signed)
OFFICE VISIT   Hilligoss, Ileah R  DOB:  Mar 21, 1935                                        October 11, 2006  CHART #:  [id number]   Her blood pressure is 130/70 and pulse 74, respirations 18, sats are  98%.   HEAD, EYES, EARS, NOSE AND THROAT: Were unremarkable.  CHEST: Clear to auscultation and percussion.  HEART: Was regular sinus rhythm.  Her incisions are well-healed.   Her chest x-ray showed thickening, but doing well after a right middle  lobectomy for carcinoid tumor.   Plan to see her again in three months and get a CT scan of the chest at  that time.   Ines Bloomer, M.D.  Electronically Signed   DPB/MEDQ  D:  10/11/2006  T:  10/11/2006  Job:  [id number]

## 2010-09-01 NOTE — Assessment & Plan Note (Signed)
OFFICE VISIT   [name], [name] R  DOB:  1935-02-19                                        April 06, 2007  CHART #:  [id number]   The patient came today and her chest x-ray is stable with no evidence of  recurrence of disease.  She is going to [place] and [place] in  June.  Her lungs are clear to auscultation and percussion.  Her blood  pressure was 133/79, pulse 88, respirations 18, sats were 95%.  I will  see her back again in 2 weeks with a chest x-ray.  I will see her back  in 3 months with a CT scan.   Ines Bloomer, M.D.  Electronically Signed   DPB/MEDQ  D:  04/06/2007  T:  04/07/2007  Job:  [id number]

## 2010-09-01 NOTE — Letter (Signed)
January 11, 2007   Soyla Murphy. Renne Crigler, M.D.  [address]  [place], [place] [address]   Re:  [name] [name], [name] [name]             DOB:  1934-10-19   Dear [name]:   I hope that you are doing better from your medical problems.  I saw this  patient today and she is doing great.  Her blood pressure is 117/72,  pulse 84, respirations 18, saturations were 95%.  CT scan 6 months since  we did a right middle lobectomy showed no recurrence of her carcinoid.  I will see her again in 3 months with a chest x-ray.  She is going to  [place] in the near future.  She does have what sounds to me like a viral  URI and we found nothing on her CT scan to indicate any type of  pneumonic process.   I appreciate the opportunity to see this patient.   Ines Bloomer, M.D.  Electronically Signed   DPB/MEDQ  D:  01/11/2007  T:  01/12/2007  Job:  [id number]   cc:   Leslye Peer, MD

## 2010-09-01 NOTE — Letter (Signed)
Aug 20, 2009   Soyla Murphy. Renne Crigler, MD  [address]  [place], [place] [address]   Re:  [name] [name], [name] [name]             DOB:  January 02, 1935   Dear [name],   I saw the patient today.  She is doing well from our standpoint.  Her  blood pressure was 145/75, pulse 78, respirations 18, and sats were 95%.  Lungs are clear to auscultation and percussion.  Her CT scan showed no  evidence of recurrence of her carcinoid tumor.  She is now 3 years since  her surgery.  I also do not see any evidence of any other carcinoid.  We  will refer her back to you for [place]-term followup.  I do recommend that  she get a yearly chest x-ray as there is a 5% incidence of multiple  carcinoid syndrome.  I appreciate the opportunity of seeing the patient.   Sincerely,   Ines Bloomer, M.D.  Electronically Signed   DPB/MEDQ  D:  08/20/2009  T:  08/20/2009  Job:  [id number]

## 2010-09-01 NOTE — Letter (Signed)
July 25, 2007   Soyla Murphy. Renne Crigler, M.D.  [address]  [place], [place] [address]   Re:  [place], [place] R             DOB:   Dear [name]:   I saw Mrs. Barnhard back today and her CT scan showed no evidence of  recurrence of her cancer.  Her blood pressure is 126/73, pulse 90,  respirations 18.  Saturations were 97%.  Lungs were clear to  auscultation and percussion.  I will see her back again in six months  with a chest x-ray and then a CT scan at one year.   Ines Bloomer, M.D.  Electronically Signed   DPB/MEDQ  D:  07/25/2007  T:  07/25/2007  Job:  [id number]

## 2010-09-01 NOTE — Assessment & Plan Note (Signed)
OFFICE VISIT   [name] [name], [name] [name]  DOB:  May 06, 1934                                        January 31, 2008  CHART #:  [id number]   The patient returns today.  She is now over 18 months since her surgery  of a right middle lobectomy for carcinoid.  Her chest x-ray is stable  with no evidence of any recurrence of the cancer.  I plan to see her  back in 6 months with a CT scan.  She is doing well overall.  She has  traveled to [place] and [place].  Her blood pressure is 131/78, pulse  80, respirations 18, and sats were 98%.   Ines Bloomer, M.D.  Electronically Signed   DPB/MEDQ  D:  01/31/2008  T:  02/01/2008  Job:  [id number]

## 2010-09-04 ENCOUNTER — Ambulatory Visit
Admission: RE | Admit: 2010-09-04 | Discharge: 2010-09-04 | Disposition: A | Discharge status: Home / Self Care | Payer: Medicare Other | Source: Ambulatory Visit | Attending: Internal Medicine | Admitting: Internal Medicine

## 2010-09-04 DIAGNOSIS — E042 Nontoxic multinodular goiter: Secondary | ICD-10-CM

## 2010-09-04 IMAGING — US US SOFT TISSUE HEAD/NECK
1 series · 13 of 25 positions shown · non-contrast
Comparison: Report of thyroid ultrasound from [place] dated
07/16/2008, describing colloid cysts in the left upper pole,
dystrophic calcification in the mid left lobe, and 8 mm nodule in
the right lobe.

CLINICAL DATA: Follow-up thyroid nodules.

THYROID ULTRASOUND 09/04/2010:
TECHNIQUE: Ultrasound examination of the thyroid gland and adjacent
soft tissues was performed.

[Series 1: us soft tissue head/neck · 0.07mm/px · 13 of 47 slices shown]
[im 1/47]
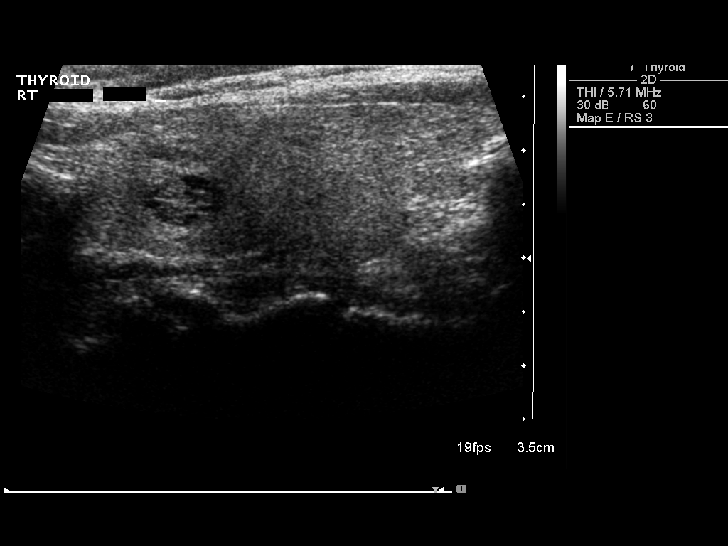
[im 4/47]
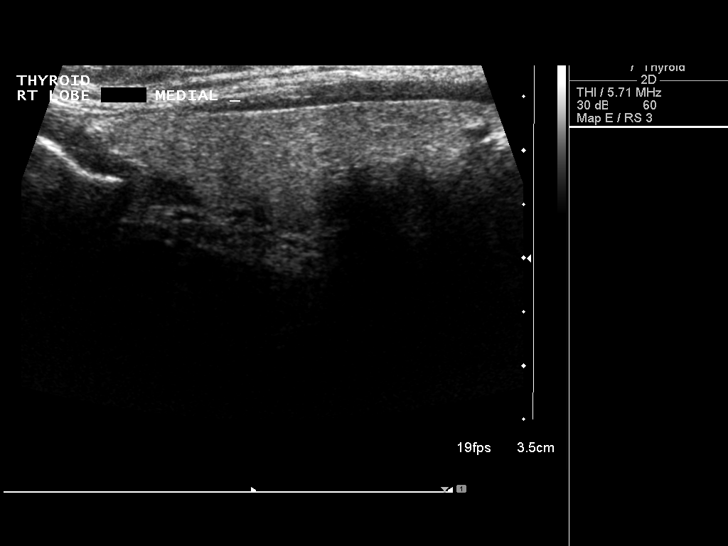
[im 8/47]
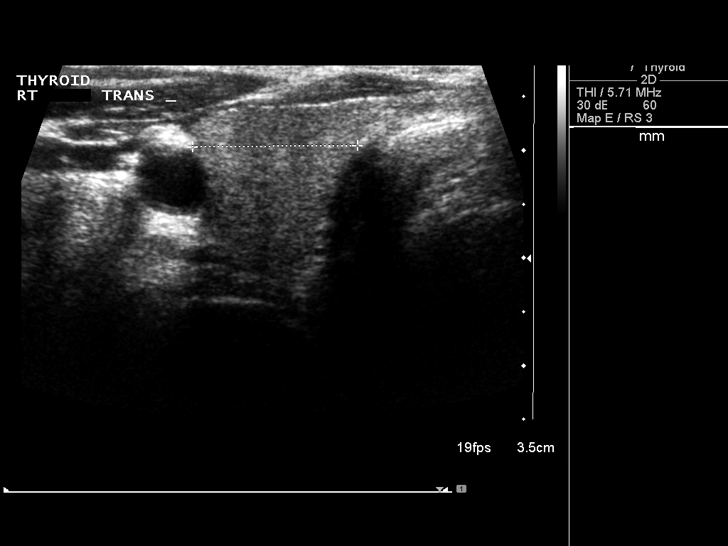
[im 12/47]
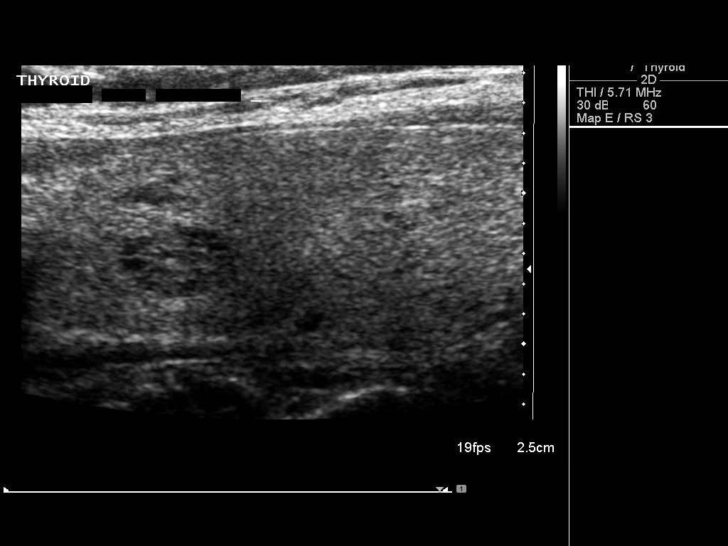
[im 16/47]
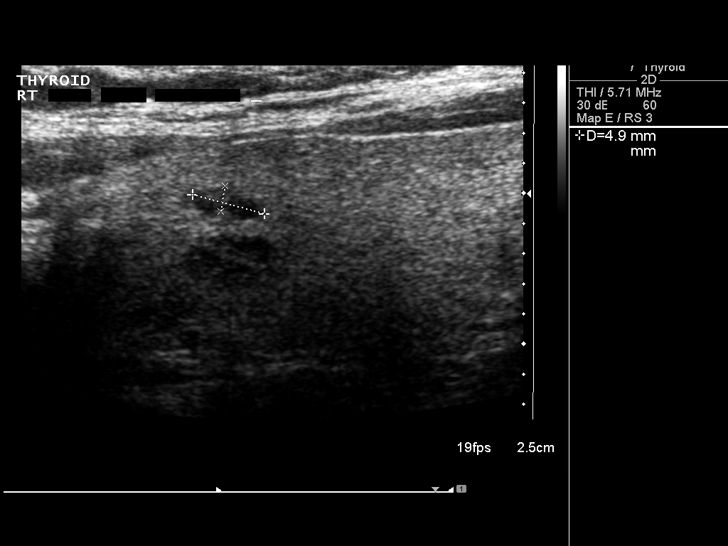
[im 20/47]
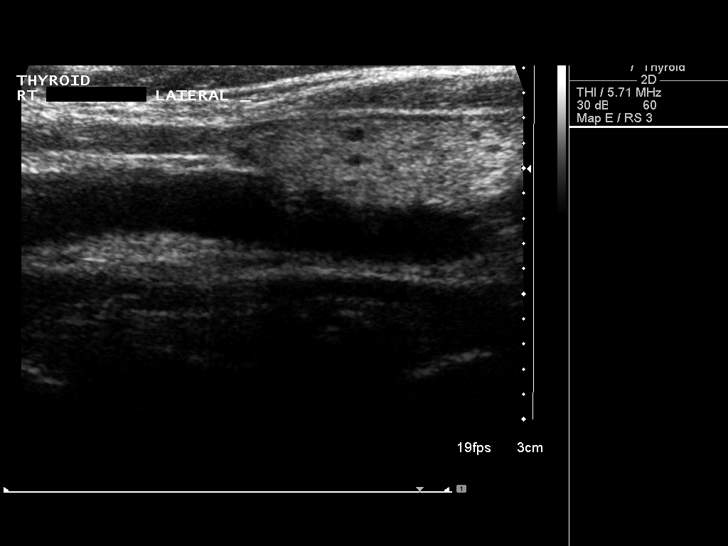
[im 24/47]
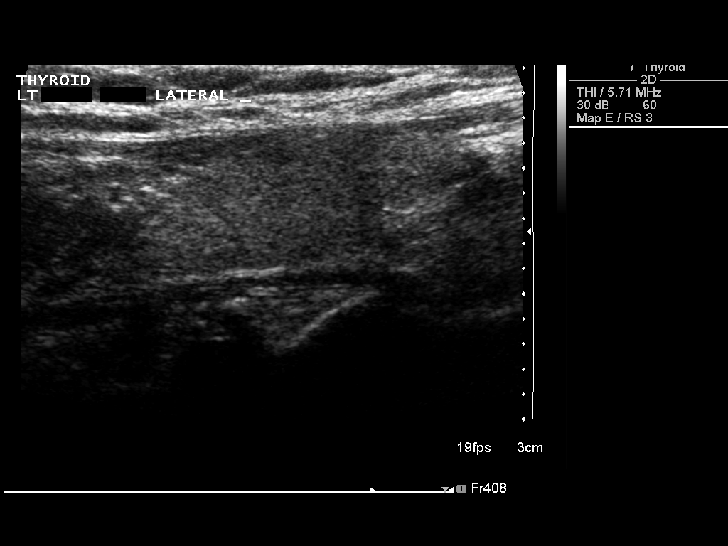
[im 27/47]
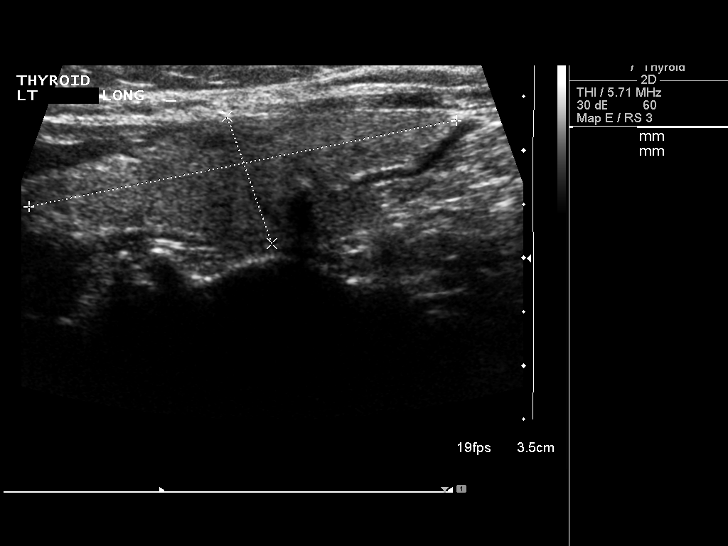
[im 31/47]
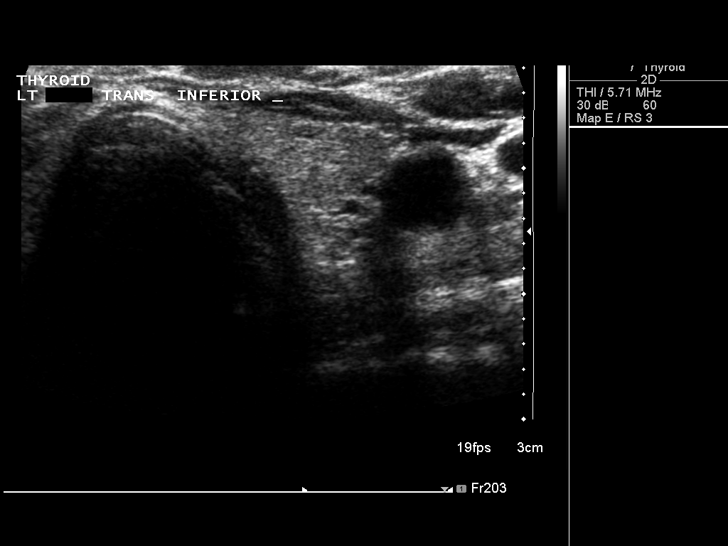
[im 35/47]
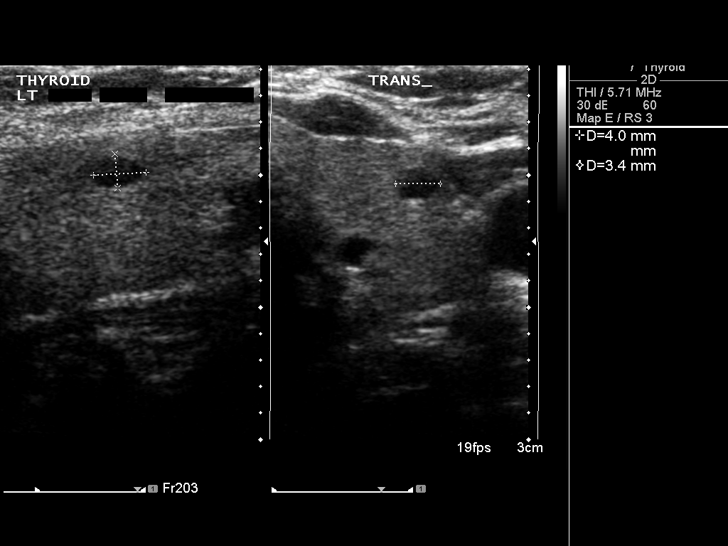
[im 39/47]
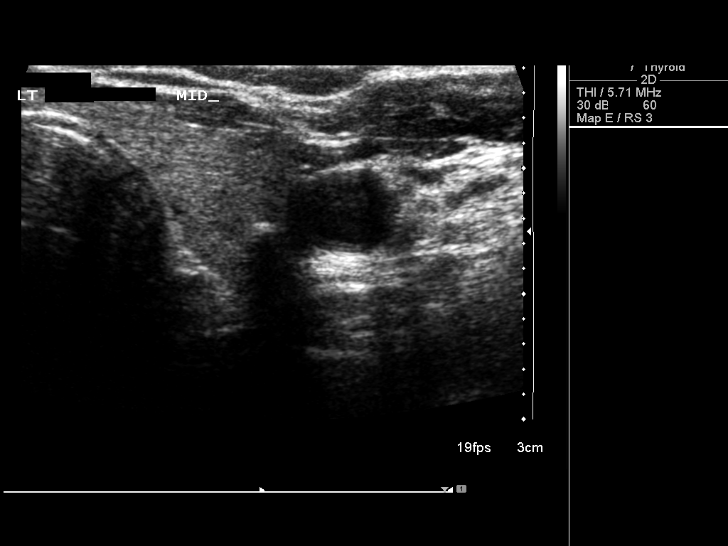
[im 43/47]
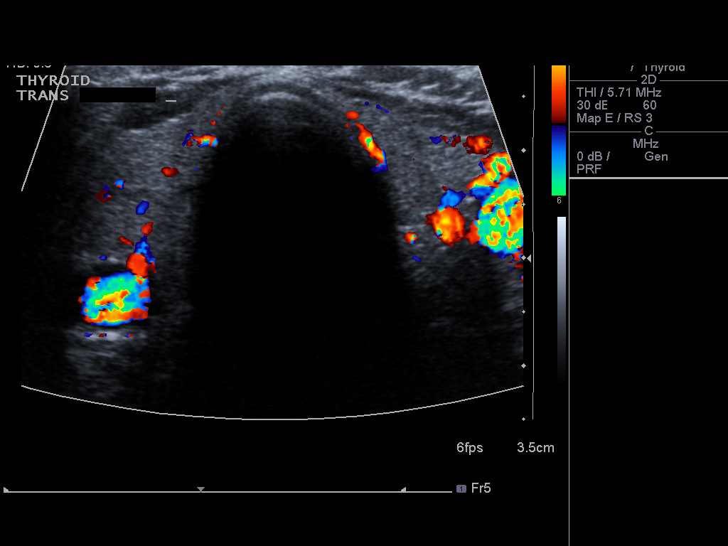
[im 47/47]
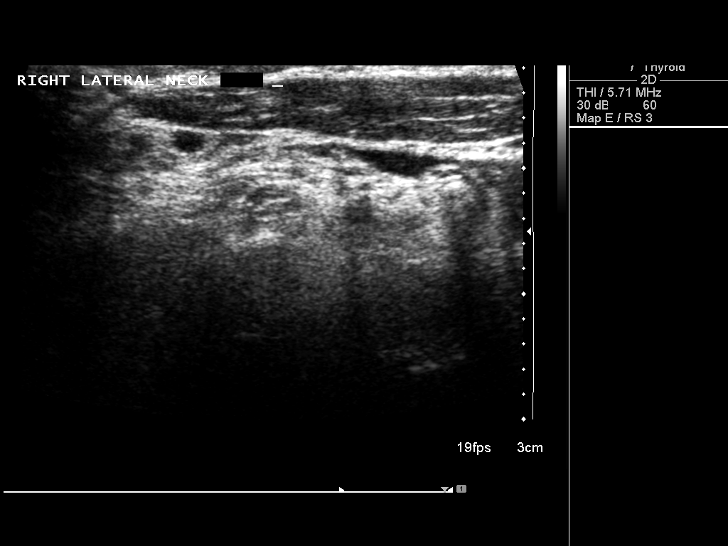

[13 of 25 positions shown; findings below may reference images not displayed]

FINDINGS: Right thyroid lobe:  Normal in size measuring approximate 4.3 x
x 1.5 cm.  Homogeneous echotexture.

Left thyroid lobe:  Normal in size measuring approximately 4.1 x
1.3 x 1.3 cm.  Homogeneous echotexture.

Isthmus:  Normal in size measuring approximate 0.2 cm thickness.
Homogeneous echotexture.

Focal nodules:  Bilateral, including:
1.  Solid nodule medial right lobe approximating 0.8 x 0.5 x
cm, unchanged from the prior report.
2.  Adjacent to the right mid lobe solid nodule measuring
approximately 0.5 x 0.2 x 0.4 cm, not described on the prior
report.
3.  Colloid cysts in the upper pole of the left lobe approximating
0.4 cm each, unchanged from the prior report.
4.  Dystrophic calcification in the left mid lobe approximating 3
mm, unchanged from the prior report.

Lymphadenopathy:  None visualized.
IMPRESSION: 1. Stable sub-centimeter solid nodule in the mid right lobe and
stable colloid cysts and dystrophic calcification in the left lobe.
2.  New 0.5 cm solid nodule in the mid right lobe.

Given the presence of the new very small nodule in the mid right
lobe, follow-up ultrasound in 1 year is suggested.

## 2010-09-04 NOTE — H&P (Signed)
NAME:  [name] [name], [name] [name]                   ACCOUNT NO.:  [id number]   MEDICAL RECORD NO.:  [id number]          PATIENT TYPE:  INP   LOCATION:  NA                           FACILITY:  [facility]   PHYSICIAN:  Ines Bloomer, M.D. DATE OF BIRTH:  10-21-1934   DATE OF ADMISSION:  06/09/2006  DATE OF DISCHARGE:                              HISTORY & PHYSICAL   HISTORY OF PRESENT ILLNESS:  This 76 year old patient was referred by  Dr. Delton Coombes and Dr. Merri Brunette.  She first was evaluated for nasal  congestion with greenish phlegm and fever.  A chest x-ray showed  possible right lower lobe infiltrate and this was improved with  treatment.  This was first done in November, but a follow-up chest x-ray  done in December showed a questionable right middle lobe nodule.  A CT  scan was done and CT scan revealed a right middle lobe nodule.  A PET  scan was done for this and this was positive with no other areas being  positive.  Dr. Delton Coombes then did a bronchoscopy which revealed carcinoid  tumor.  She was seen in consultation and surgery recommended.  This was  in January and she had decided to have surgery in the end of February.   PAST MEDICAL HISTORY:  She has no allergies.  She is on Lipitor 40 mg  day, Evista 60 mg, and calcium.  She has osteopenia and  hypercholesterolemia.   FAMILY HISTORY:  Significant for COPD in a grandmother and negative for  cancer or vascular disease.   SOCIAL HISTORY:  She is married and has three children.  She is married  to Dr. Kathrene Bongo, a retired Investment banker, operational.  She does not drink or  smoke.   REVIEW OF SYSTEMS:  She is 125 pounds.  She is 5 foot 2.  CARDIAC:  No  angina or atrial fibrillation.  PULMONARY: No bronchitis, hemoptysis or  wheezing.  GI: No nausea, vomiting, constipation, diarrhea, no GERD.  GU: No kidney disease, frequent urination, no dysuria.  VASCULAR: No  claudication, DVT, TIAs.  NEUROLOGIC: No headaches, blackouts, seizures  or dizziness.  MUSCULOSKELETAL:  No joint pain or muscular pain.  PSYCHIATRIC:  No nervousness or depression.  ENT: No change in eyesight  or hearing.  HEMATOLOGICAL:  No problems with bleeding or anemia.   PHYSICAL EXAMINATION:  She is a well-developed Caucasian female in no  acute distress.  Her blood pressure is 110/64, pulse 88, respirations  18, sats were 97%.  HEENT:  Head is atraumatic.  Eyes: Pupils equal, reactive to light and  accommodation.  Her extraocular movements are normal.  Ears: Tympanic  membranes intact.  Nose: There is no septal deviation.  Throat is  without lesions.  NECK:  Supple without thyromegaly.  No carotid bruits.  No  supraclavicular or axillary adenopathy.  CHEST:  Clear to auscultation and percussion.  HEART:  Regular sinus rhythm.  No murmurs.  ABDOMEN:  Soft.  There is no hepatosplenomegaly.  Pulses are 2+.  EXTREMITIES:  There is no clubbing or edema.  There is  no cyanosis.  NEUROLOGICAL:  She is oriented x3.  Sensory and motor intact.  Cranial  nerves are intact.  SKIN:  Without lesion.   IMPRESSION:  1. Right middle lobe mass.  2. Osteopenia.  3. Hypercholesterolemia.   PLAN:  Right VATS lobectomy.      Ines Bloomer, M.D.  Electronically Signed     DPB/MEDQ  D:  06/08/2006  T:  06/08/2006  Job:  [id number]

## 2010-09-04 NOTE — Assessment & Plan Note (Signed)
[facility]                             PULMONARY OFFICE NOTE   NAME:[name], [name] BUCHHOLZ                          MRN:          [id number]  DATE:03/28/2006                            DOB:          18-Oct-1934    REASON FOR CONSULTATION:  We were asked by Dr. Merri Brunette to evaluate  [name] [name] for abnormal chest x-ray following community acquired  pneumonia.   HISTORY:  [name] [name] is a 76 year old woman with little past medical  history who began to experience nasal congestion, cough with greenish  phlegm and fever.  She had a chest x-ray in October that showed a  possible right lower lobe infiltrate, was treated for a community  acquired pneumonia with improvement and with interval clearing of right  sided chest x-ray abnormality.  She has had recurrent upper respiratory  symptoms in November that lasted about 1 week and which prompted a  followup chest x-ray.  This film showed incomplete clearance of her  right middle lobe opacity and a possible pulmonary nodule.  She again  has upper respiratory symptoms, although she states that she felt well  in between these episodes.  She is referred today given the persistent  abnormality in her right middle lobe on chest film.   PAST MEDICAL HISTORY:  None.   ALLERGIES:  None.   CURRENT MEDICATIONS:  1. Evista 60 mg daily.  2. Lipitor 40 mg daily.  3. Fish Oil.  4. Omega 3 daily.  5. Calcium 600 mg b.i.d.  6. Vitamin C 500 mg daily.  7. Aspirin 81 mg daily.  8. Multivitamin 1 daily.  9. Citracal once daily.   SOCIAL HISTORY:  The patient is married.  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  She has traveled to [place] and also spent several  months in [place], including [place], [place], [place] and [place] over  recent months.  She does not have any known tuberculosis exposure.  She  is a never-smoker.   FAMILY HISTORY:  Is significant only for COPD in her grandmother.   REVIEW OF SYSTEMS:  She  is currently without complaint.  She did have  dyspnea with exertion at the time she was diagnosed with her original  pneumonia.  Her weight has been stable and her appetite has been good.  She had not seem hemoptysis.   EXAMINATION:  GENERAL:  This is a well-appearing woman who is in no  distress.  Her weight is 126 pounds.  Temperature 98.3, blood pressure 126/72,  Heart rate is 71.  SPO2 96% on room air.  HEENT:  She had some mild posterior pharyngeal erythema.  LUNGS:  Clear to auscultation bilaterally.  HEART:  Has a regular rate and rhythm without murmur.  ABDOMEN:  Is benign.  EXTREMITIES:  Have no cyanosis, clubbing or edema.  NEURO:  Intact.   Chest x-ray performed on 01/31/2006 in [place] does confirm a right  middle lobe infiltrate.  A follow up film in November 2007 in [place]  shows resolution of streaky infiltrate possible residual nodular  disease.  IMPRESSION:  Right middle lobe nodule on chest x-ray from November 2007.  A question of whether this is a vascular structure or pulmonary artery  versus a true  nodule.  1. Recent mild community acquired pneumonia in October 2007 with      interval clearing of her parenchymal infiltrate in the right middle      lobe.  2. Current upper respiratory infection.   PLAN:  1. Computerized tomography of the chest with contrast to better      characterize the right middle lobe opacity.  If this is indeed a      nodule then I believe that she will require biopsy.  2. I will follow up with [name] [name] after the computerized tomography      scan has been performed.     Leslye Peer, MD  Electronically Signed    RSB/MedQ  DD: 04/11/2006  DT: 04/11/2006  Job #: [id number]   cc:   Soyla Murphy. Renne Crigler, M.D.  Ines Bloomer, M.D.

## 2010-09-04 NOTE — Discharge Summary (Signed)
NAME:  [name] [name], [name] [name]                   ACCOUNT NO.:  [id number]   MEDICAL RECORD NO.:  [id number]          PATIENT TYPE:  INP   LOCATION:  3304                         FACILITY:  [facility]   PHYSICIAN:  Rowe Clack, P.A.-C. DATE OF BIRTH:  [birth year]   DATE OF ADMISSION:  06/09/2006  DATE OF DISCHARGE:                               DISCHARGE SUMMARY   DATE OF DISCHARGE:  June 13, 2006.   HISTORY OF PRESENT ILLNESS:  The patient is a 75 year old female  referred to Dr. Edwyna Shell for thoracic surgery consultation.  She is a  nonsmoker who was found to have bronchitis and a right middle lobe  lesion.  The bronchoscopy did reveal a carcinoid, probably atypical  carcinoid.  She was felt to be a candidate for resection and was  admitted in this hospitalization for the procedure.   PAST MEDICAL HISTORY:  Hypercholesterolemia and osteopenia.   MEDICATIONS PRIOR TO ADMISSION:  1. Aspirin 81 mg daily.  2. Lipitor 40 mg daily.  3. Evista 60 mg daily.  4. Calcium twice daily.  5. Vitamin C once daily.  6. Multivitamin once daily.   ALLERGIES:  No known drug allergies.   FAMILY HISTORY:  Noncontributory for vascular disease or cancer.   For social history, review of symptoms, and physical examination, please  see the history and physical done at the time of admission.   HOSPITAL COURSE:  The patient was admitted electively for VATS  lobectomy, and on June 09, 2006, she was taken to the operating  room, at which time she underwent a right video-assisted thoracoscopic  surgery with right middle lobe lobectomy.  She tolerated the procedure  well.  Frozen section was consistent with carcinoid features.  Postoperative hospital course, the patient has done quite well.  She has  maintained stable hemodynamics.  All routine lines, monitors, and  drainage devices have been discontinued in the standard fashion.  Incision is healing well without evidence of infection.  She has  tolerated rapid  advancement in activity commensurate for level of  postoperative convalescence using standard protocols.  Oxygen has been  weaned, and she maintains adequate saturations on room air.  Her overall  status was felt to be acceptable for discharge on June 13, 2006,  upon morning round evaluation by Dr. Edwyna Shell.   CONDITION ON DISCHARGE:  Stable and improving.   DISCHARGE MEDICATIONS:  As preoperatively.  Additionally for pain, Tylox  1 or 2 every 6 hours as needed, Ultram 50 mg one every 6 hours as  needed.  She is to continue her home medications as well.   FOLLOW UP:  Followup will include a Dr. Edwyna Shell appointment in one week  with a chest x-ray.   FINAL DIAGNOSIS:  Carcinoid tumor on pathology frozen section.  The  final results of pathology are currently pending to the chart.  Other  diagnoses as listed per the history.      Rowe Clack, P.A.-C.     Sherryll Burger  D:  06/13/2006  T:  06/13/2006  Job:  [id number]   cc:   Dorinda Hill  Delrae Alfred, M.D.  Soyla Murphy. Renne Crigler, M.D.  Dr. Solon Augusta

## 2010-09-04 NOTE — Op Note (Signed)
NAME:  [name] [name], [name] [name]                   ACCOUNT NO.:  [id number]   MEDICAL RECORD NO.:  [id number]          PATIENT TYPE:  AMB   LOCATION:  ENDO                         FACILITY:  [facility]   PHYSICIAN:  Leslye Peer, MD    DATE OF BIRTH:  1934-11-20   DATE OF PROCEDURE:  04/26/2006  DATE OF DISCHARGE:                               OPERATIVE REPORT   PROCEDURE:  Fiberoptic bronchoscopy with bronchoalveolar lavage and  transbronchial biopsies.   OPERATOR:  Leslye Peer, M.D.   INDICATIONS:  Right middle lobe nodule.   MEDICATIONS GIVEN:  1. Fentanyl 100 mcg IV in divided doses.  2. Versed 5 mg IV in divided doses.  3. Cetacaine spray to the posterior pharynx.  4. Lidocaine 1% 37 mL total to the bronchoalveolar tree.  5. 2% viscous lidocaine to the right nare.   CONSENT:  Consent was obtained from the patient, signed copy is on her  hospital chart.   PROCEDURE DETAILS:  After informed consent was obtained as indicated  above, conscious sedation was initiated as indicated above.  The  fiberoptic bronchoscope was introduced through the right nare and the  posterior pharynx and glottis were visualize. The vocal cords appeared  normal and moved appropriately with inspiration and phonation.  The  trachea was intubated and local anesthesia was achieved with 1%  lidocaine.  The trachea and bilateral main stem bronchi were within  normal limits.  The main carina was sharp.  The left sided exam showed a  prominent lingular bronchus and a slightly smaller left lower lobe  bronchus which is a normal anatomical variant. Her left upper lobe  bronchus appeared normal.  All bronchial and segmental airways on the  left were within normal limits without any evidence of endobronchial  lesions or abnormal secretions.  Attention was then turned to the right  side exam. The right upper lobe bronchi were grossly normal.  The  bronchus intermedius, right middle lobe, and right lower lobe bronchi  and  segmental airways were normal, as well.  Again, no endobronchial  lesions or abnormal secretions were seen.  Under fluoroscopic guidance,  bronchial brushings were obtained from the right middle lobe in the area  of her right middle lobe nodule.  These will be sent for cytology.  Transbronchial biopsies were then performed under fluoroscopic guidance  from the same region to be sent for pathology.  Finally, bronchoalveolar  lavage was performed from the anterior segment of the right middle lobe  with 60 mL of normal saline instilled and approximately 24 mL returned.  This will be sent for microbiology and also cytology.  The patient  tolerated the procedure very well.  Her vital signs were stable  throughout.  There was approximately 1-2 mL total blood loss with good  hemostasis by the end of procedure.  She returned to the recovery room  in good condition and a chest x-ray is pending at this time.   SAMPLES:  1. Bronchial brushings from the right middle lobe.  2. Transbronchial biopsies from the right middle lobe.  3. Bronchioalveolar  lavage from the right middle lobe.   PLANS:  I will follow up the pathology and microbiology results with Ms.  Rambert this week.  If her biopsy is unrevealing, then I believe that she  will require more definitive biopsy, probably a VATS biopsy to be  performed by Dr. Karle Plumber.      Leslye Peer, MD  Electronically Signed     RSB/MEDQ  D:  04/26/2006  T:  04/26/2006  Job:  [id number]   cc:   Ines Bloomer, M.D.  Soyla Murphy. Renne Crigler, M.D.

## 2010-09-04 NOTE — Op Note (Signed)
NAME:  [name] [name], [name] [name]                   ACCOUNT NO.:  [id number]   MEDICAL RECORD NO.:  [id number]          PATIENT TYPE:  INP   LOCATION:  3304                         FACILITY:  [facility]   PHYSICIAN:  Ines Bloomer, M.D. DATE OF BIRTH:  [birth year]   DATE OF PROCEDURE:  06/09/2006  DATE OF DISCHARGE:                               OPERATIVE REPORT   PREOPERATIVE DIAGNOSIS:  Right middle lobe carcinoid tumor.   POSTOPERATIVE DIAGNOSIS:  Right middle lobe carcinoid tumor.   OPERATION PERFORMED:  Right video-assisted thoracoscopic surgery with  lobectomy.   SURGEON:  Ines Bloomer, M.D.   FIRST ASSISTANT:  Rowe Clack, P.A.-C.   ANESTHESIA:  General anesthesia.   DESCRIPTION OF PROCEDURE:  After percutaneous insertion of all  monitoring lines, the patient underwent general anesthesia.  A double-  lumen tube was inserted.  The patient was turned to the right lateral  thoracotomy position and was prepped and draped in the usual sterile  manner.  Two trocar sites were made in the anterior and midaxillary line  at the seventh intercostal space and 2 trocars were inserted.  The right  middle lobe was stuck to the diaphragm and the pericardium.  The rest of  the lung looked satisfactory.  A 4 to 5-cm incision was made over the  fifth intercostal space within the serratus, and I put in a small  retractor just to retract the tissue.  Through that, we were able to  take down the adhesions of the middle lobe to the diaphragm and the  pericardium.  Another incision was made at the triangle of auscultation  of about 2 cm, and this was used to insert a Kaiser ring forceps to  retract the middle lobe medially.  We first partially divided the minor  fissure and the inferior portion of the major fissure with an Autosuture  60 stapler.  Then, we dissected out the hilum and 2 branches of the  bronchus, and we divided these 2 branches of veins, ligating proximally  and distally with 2-0 silk and  clipping them and divided.  That exposed  the bronchus.  With the bronchus, there was a tremendous amount of  reaction with 11L nodes that had to be dissected free.  The bronchus was  very inflamed.  Inferiorly, we found the medial branch of the pulmonary  artery to the middle lobe.  This was doubly ligated with 2-0 silk,  clipped and divided.  The bronchus was finally dissected out, stapled  with a TA-30 stapler and divided distally with a knife.  This exposed  the lateral branch of the pulmonary artery, and this was suture  ligatured proximally and distally with 2-0 silk, clipped and divided.  Finally, the rest of the fissure was completed with the Autosuture 60  green and 45 green staplers.  The right middle lobe was placed in an  EndoPouch bag and removed.  It was sent for frozen section that revealed  negative bronchial margins and it was a carcinoid tumor.  The bronchial  closure was reinforced with three 4-0 Prolenes in  interrupted fashion.  No air leak was seen from the bronchial stump.  There was a small air  leak from the fissure line.  This was sutured with 3-0 Vicryl and then  we put CoSeal on it.  Two chest tubes were brought in through the  inferior trocar sites.  The posterior access site was closed with 3-0  Vicryl in a subcuticular stitch.  A single On-Q was inserted in the  usual fashion subpleurally and secured in place with Steri-Strips.  The  chest was closed with 2 paracostals, drilling  through the sixth rib, passing the paracostals around the top of the  fifth rib, and #1 Vicryl in the muscle layer, 2-0 Vicryl in the  subcutaneous tissue and 3-0 Vicryl in a subcuticular stitch with  Dermabond for the skin.  The patient was returned to the recovery room  in stable condition.      Ines Bloomer, M.D.  Electronically Signed     DPB/MEDQ  D:  06/09/2006  T:  06/09/2006  Job:  [id number]   cc:   Dorita Sciara, MD

## 2010-09-10 ENCOUNTER — Other Ambulatory Visit: Payer: Medicare Other

## 2011-08-18 DIAGNOSIS — Z7902 Long term (current) use of antithrombotics/antiplatelets: Secondary | ICD-10-CM | POA: Diagnosis not present

## 2011-08-18 DIAGNOSIS — Z79899 Other long term (current) drug therapy: Secondary | ICD-10-CM | POA: Diagnosis not present

## 2011-08-18 DIAGNOSIS — M899 Disorder of bone, unspecified: Secondary | ICD-10-CM | POA: Diagnosis not present

## 2011-08-18 DIAGNOSIS — E039 Hypothyroidism, unspecified: Secondary | ICD-10-CM | POA: Diagnosis not present

## 2011-08-18 DIAGNOSIS — E7800 Pure hypercholesterolemia, unspecified: Secondary | ICD-10-CM | POA: Diagnosis not present

## 2011-08-30 DIAGNOSIS — M949 Disorder of cartilage, unspecified: Secondary | ICD-10-CM | POA: Diagnosis not present

## 2011-08-30 DIAGNOSIS — M4120 Other idiopathic scoliosis, site unspecified: Secondary | ICD-10-CM | POA: Diagnosis not present

## 2011-08-30 DIAGNOSIS — E7800 Pure hypercholesterolemia, unspecified: Secondary | ICD-10-CM | POA: Diagnosis not present

## 2011-08-30 DIAGNOSIS — Z85118 Personal history of other malignant neoplasm of bronchus and lung: Secondary | ICD-10-CM | POA: Diagnosis not present

## 2011-08-30 DIAGNOSIS — Z8249 Family history of ischemic heart disease and other diseases of the circulatory system: Secondary | ICD-10-CM | POA: Diagnosis not present

## 2011-08-30 DIAGNOSIS — M899 Disorder of bone, unspecified: Secondary | ICD-10-CM | POA: Diagnosis not present

## 2011-08-30 DIAGNOSIS — Z0000 Encounter for general adult medical examination without abnormal findings: Secondary | ICD-10-CM | POA: Diagnosis not present

## 2011-08-30 DIAGNOSIS — E041 Nontoxic single thyroid nodule: Secondary | ICD-10-CM | POA: Diagnosis not present

## 2011-09-08 ENCOUNTER — Other Ambulatory Visit: Payer: Self-pay | Admitting: Internal Medicine

## 2011-09-08 DIAGNOSIS — E042 Nontoxic multinodular goiter: Secondary | ICD-10-CM

## 2011-09-14 DIAGNOSIS — M810 Age-related osteoporosis without current pathological fracture: Secondary | ICD-10-CM | POA: Diagnosis not present

## 2011-09-21 ENCOUNTER — Ambulatory Visit
Admission: RE | Admit: 2011-09-21 | Discharge: 2011-09-21 | Disposition: A | Discharge status: Home / Self Care | Payer: Medicare Other | Source: Ambulatory Visit | Attending: Internal Medicine | Admitting: Internal Medicine

## 2011-09-21 DIAGNOSIS — E042 Nontoxic multinodular goiter: Secondary | ICD-10-CM | POA: Diagnosis not present

## 2011-09-21 IMAGING — US US SOFT TISSUE HEAD/NECK
1 series · 14 of 25 positions shown · non-contrast
Comparison: Ultrasound of the thyroid of 09/04/2010

CLINICAL DATA: Follow up of thyroid none

THYROID ULTRASOUND
TECHNIQUE: Ultrasound examination of the thyroid gland and adjacent
soft tissues was performed.

[Series 1: us soft tissue head/neck · 0.07mm/px · 14 of 49 slices shown]
[im 1/49]
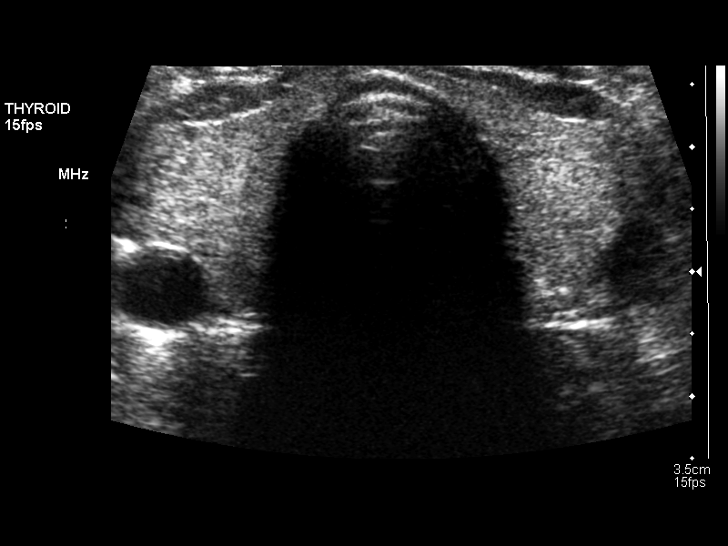
[im 5/49]
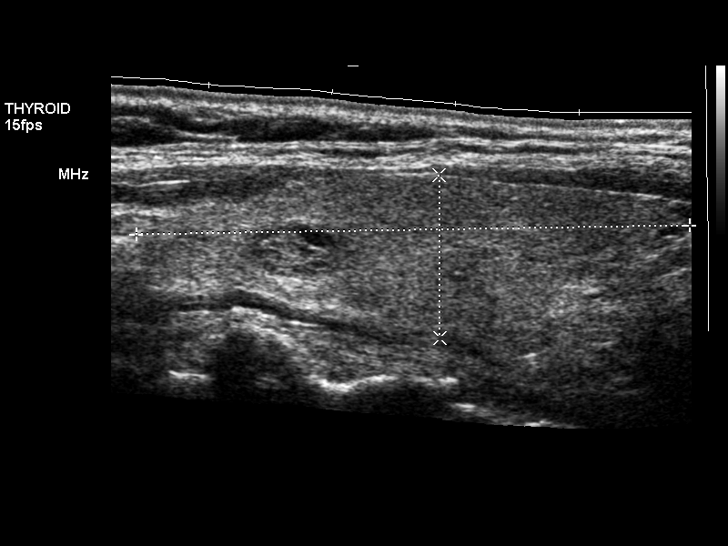
[im 9/49]
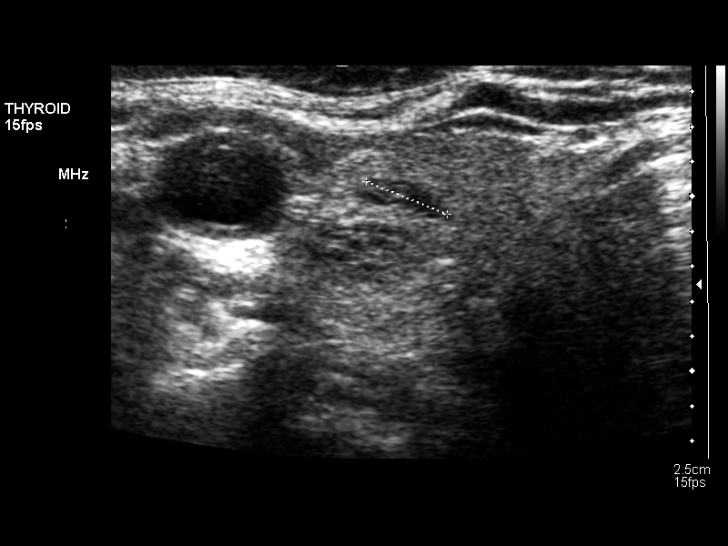
[im 13/49]
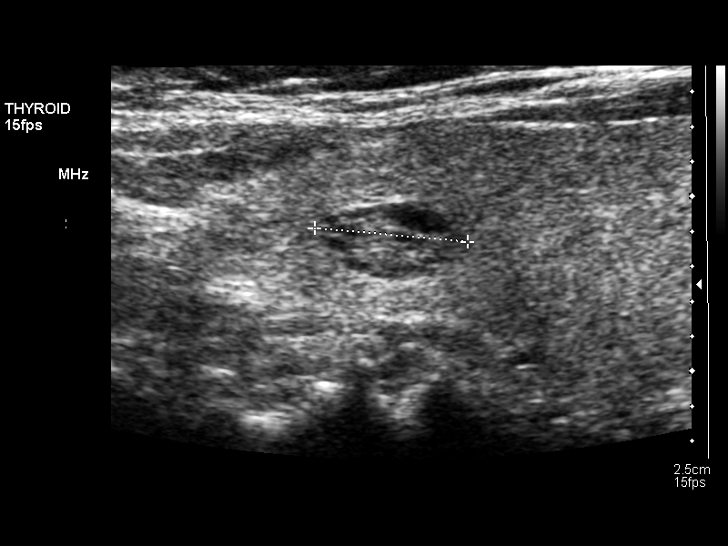
[im 17/49]
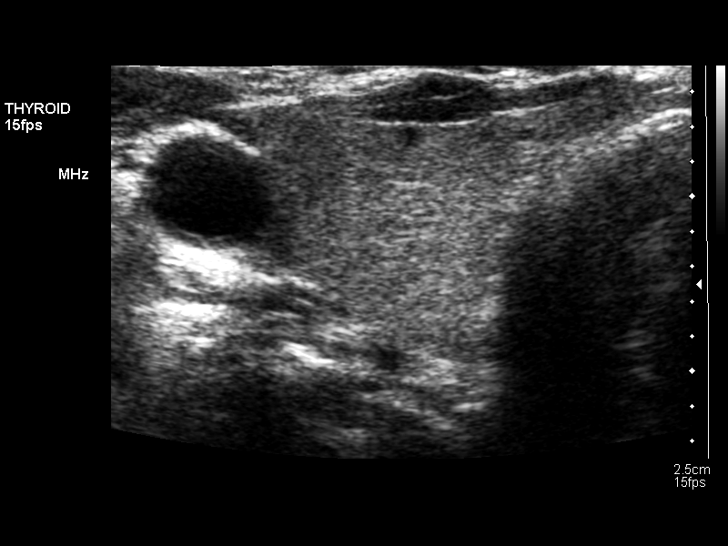
[im 19/49]
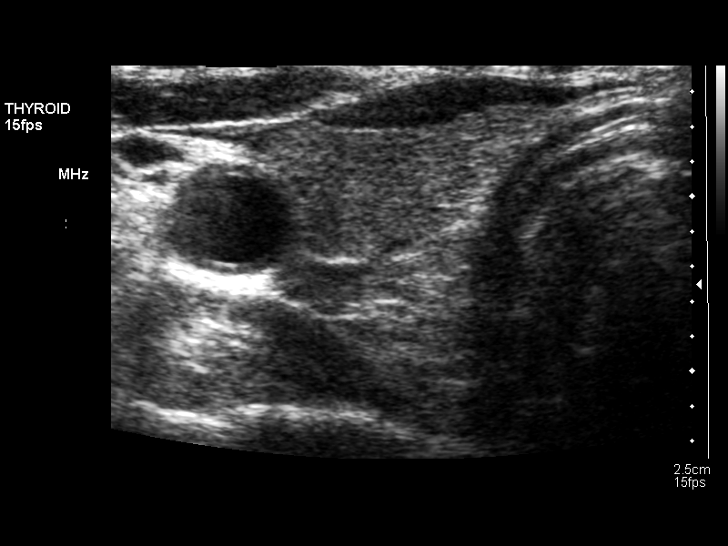
[im 23/49]
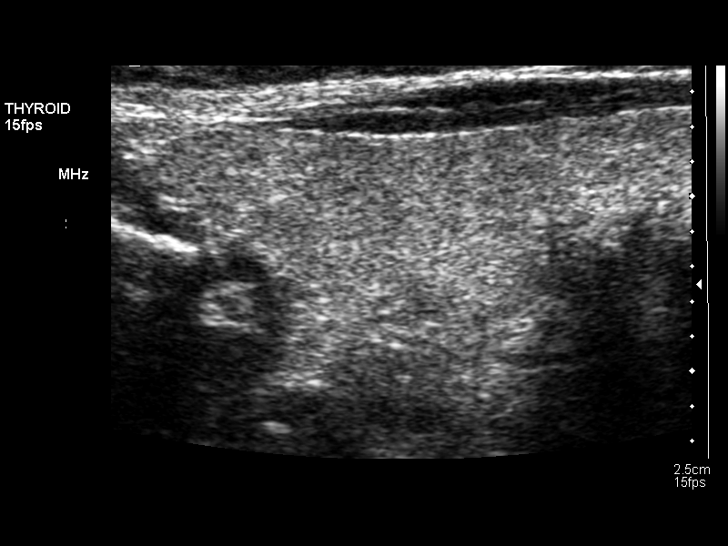
[im 27/49]
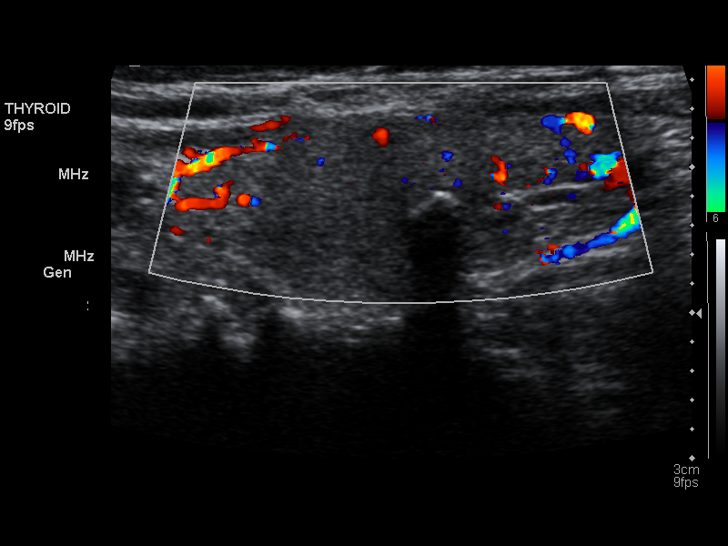
[im 31/49]
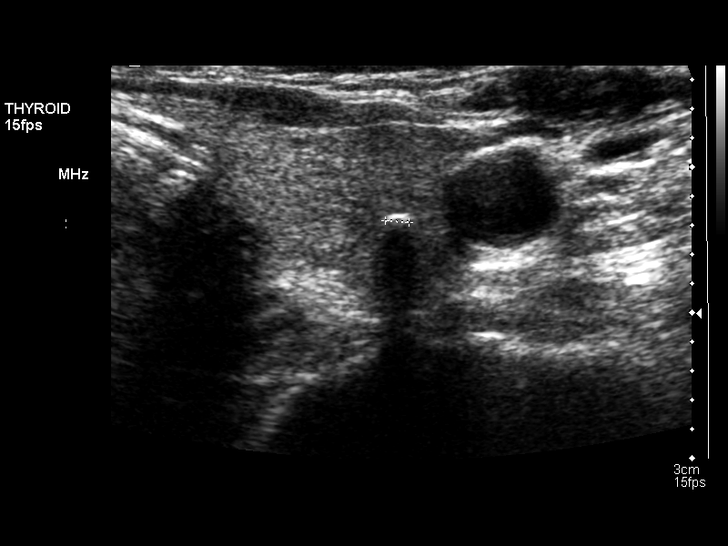
[im 33/49]
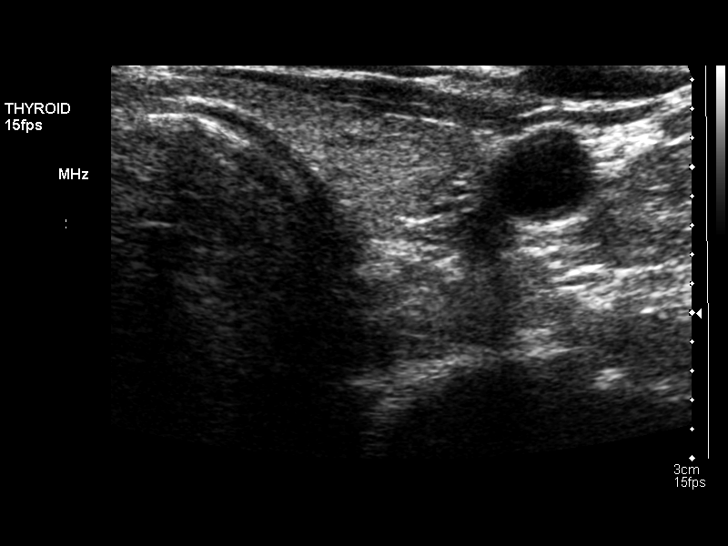
[im 37/49]
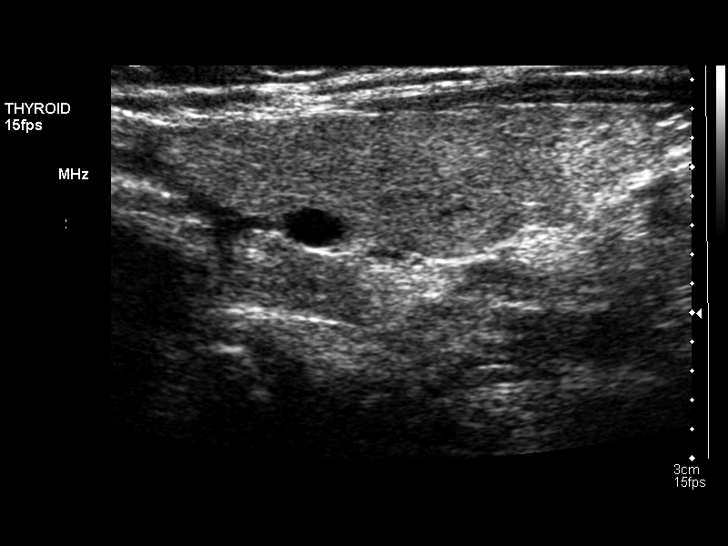
[im 41/49]
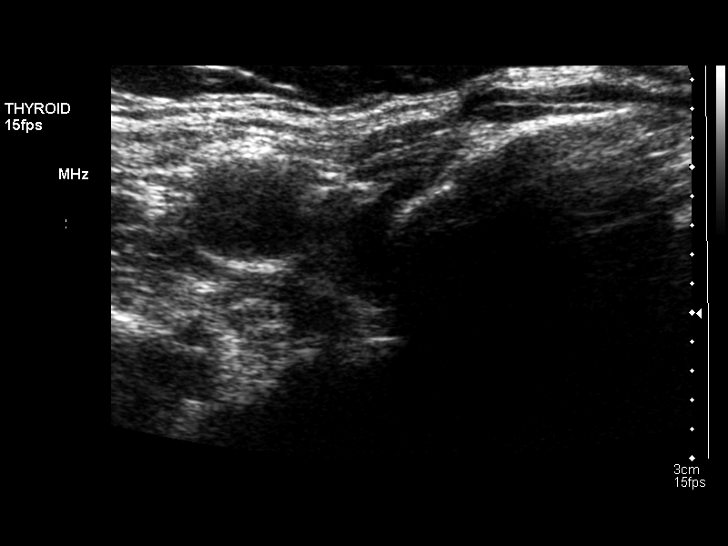
[im 45/49]
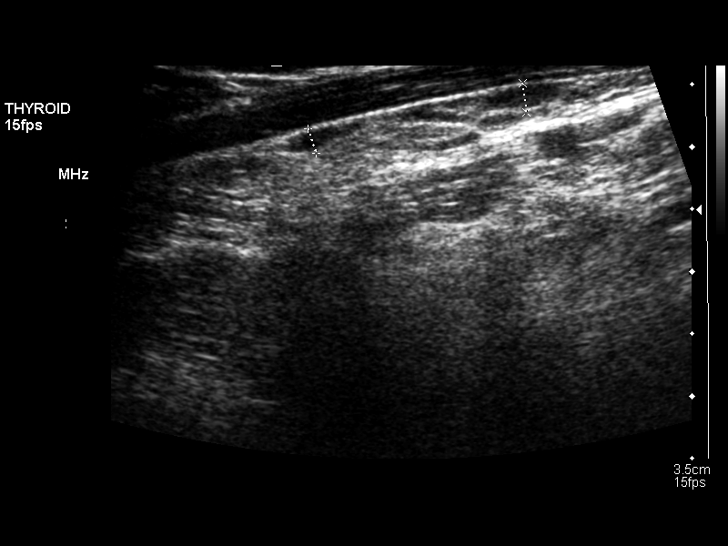
[im 49/49]
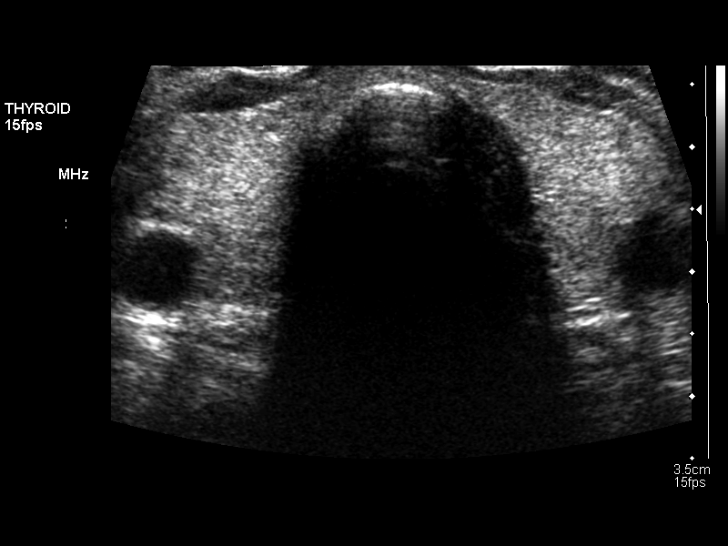

[14 of 25 positions shown; findings below may reference images not displayed]

FINDINGS: Right thyroid lobe:  4.5 x 1.3 x 1.7 cm.  (Previously 4.3 x 1.4 x
1.5 cm).
Left thyroid lobe:  3.8 x 1.5 x 1.5 cm.  (Previously 4.1 x 1.3 x
1.3 cm).
Isthmus:  2.6 mm in thickness.

Focal nodules:  The right lobe of thyroid is fairly homogeneous.
The left lobe is somewhat inhomogeneous.  There are nodules again
noted bilaterally.  The largest nodule is in the upper right lobe
of 8 x 5 x 7 mm, previously measuring 8 x 5 x 9 mm.  Other nodules
are present of no more than 5 mm in diameter.

Lymphadenopathy:  None visualized.
IMPRESSION: Stable small thyroid nodules the largest measuring 8 mm in
diameter.

## 2011-12-26 DIAGNOSIS — I6200 Nontraumatic subdural hemorrhage, unspecified: Secondary | ICD-10-CM | POA: Diagnosis not present

## 2011-12-26 DIAGNOSIS — IMO0002 Reserved for concepts with insufficient information to code with codable children: Secondary | ICD-10-CM | POA: Diagnosis not present

## 2011-12-26 DIAGNOSIS — S36119A Unspecified injury of liver, initial encounter: Secondary | ICD-10-CM | POA: Diagnosis not present

## 2011-12-26 DIAGNOSIS — S72412A Displaced unspecified condyle fracture of lower end of left femur, initial encounter for closed fracture: Secondary | ICD-10-CM | POA: Insufficient documentation

## 2011-12-26 DIAGNOSIS — T148XXA Other injury of unspecified body region, initial encounter: Secondary | ICD-10-CM | POA: Insufficient documentation

## 2011-12-26 DIAGNOSIS — Z043 Encounter for examination and observation following other accident: Secondary | ICD-10-CM | POA: Diagnosis not present

## 2011-12-27 DIAGNOSIS — S2220XA Unspecified fracture of sternum, initial encounter for closed fracture: Secondary | ICD-10-CM | POA: Insufficient documentation

## 2011-12-27 DIAGNOSIS — S36892A Contusion of other intra-abdominal organs, initial encounter: Secondary | ICD-10-CM | POA: Insufficient documentation

## 2011-12-27 DIAGNOSIS — S062XAA Diffuse traumatic brain injury with loss of consciousness status unknown, initial encounter: Secondary | ICD-10-CM | POA: Insufficient documentation

## 2011-12-27 DIAGNOSIS — I629 Nontraumatic intracranial hemorrhage, unspecified: Secondary | ICD-10-CM | POA: Insufficient documentation

## 2011-12-27 DIAGNOSIS — S2249XA Multiple fractures of ribs, unspecified side, initial encounter for closed fracture: Secondary | ICD-10-CM | POA: Diagnosis not present

## 2011-12-27 DIAGNOSIS — S36899A Unspecified injury of other intra-abdominal organs, initial encounter: Secondary | ICD-10-CM | POA: Insufficient documentation

## 2011-12-27 DIAGNOSIS — S36113A Laceration of liver, unspecified degree, initial encounter: Secondary | ICD-10-CM | POA: Insufficient documentation

## 2011-12-28 DIAGNOSIS — S2249XA Multiple fractures of ribs, unspecified side, initial encounter for closed fracture: Secondary | ICD-10-CM | POA: Diagnosis not present

## 2011-12-29 DIAGNOSIS — S2249XA Multiple fractures of ribs, unspecified side, initial encounter for closed fracture: Secondary | ICD-10-CM | POA: Diagnosis not present

## 2012-01-04 DIAGNOSIS — S064X0A Epidural hemorrhage without loss of consciousness, initial encounter: Secondary | ICD-10-CM | POA: Diagnosis not present

## 2012-01-04 DIAGNOSIS — S36113A Laceration of liver, unspecified degree, initial encounter: Secondary | ICD-10-CM | POA: Diagnosis not present

## 2012-01-04 DIAGNOSIS — IMO0002 Reserved for concepts with insufficient information to code with codable children: Secondary | ICD-10-CM | POA: Diagnosis not present

## 2012-01-04 DIAGNOSIS — M542 Cervicalgia: Secondary | ICD-10-CM | POA: Diagnosis not present

## 2012-01-04 DIAGNOSIS — M1710 Unilateral primary osteoarthritis, unspecified knee: Secondary | ICD-10-CM | POA: Diagnosis not present

## 2012-01-04 DIAGNOSIS — M25569 Pain in unspecified knee: Secondary | ICD-10-CM | POA: Diagnosis not present

## 2012-01-04 DIAGNOSIS — IMO0001 Reserved for inherently not codable concepts without codable children: Secondary | ICD-10-CM | POA: Diagnosis not present

## 2012-01-04 DIAGNOSIS — R269 Unspecified abnormalities of gait and mobility: Secondary | ICD-10-CM | POA: Diagnosis not present

## 2012-01-05 DIAGNOSIS — M542 Cervicalgia: Secondary | ICD-10-CM | POA: Diagnosis not present

## 2012-01-08 DIAGNOSIS — IMO0002 Reserved for concepts with insufficient information to code with codable children: Secondary | ICD-10-CM | POA: Diagnosis not present

## 2012-01-08 DIAGNOSIS — R269 Unspecified abnormalities of gait and mobility: Secondary | ICD-10-CM | POA: Diagnosis not present

## 2012-01-08 DIAGNOSIS — S36113A Laceration of liver, unspecified degree, initial encounter: Secondary | ICD-10-CM | POA: Diagnosis not present

## 2012-01-08 DIAGNOSIS — IMO0001 Reserved for inherently not codable concepts without codable children: Secondary | ICD-10-CM | POA: Diagnosis not present

## 2012-01-08 DIAGNOSIS — S064X0A Epidural hemorrhage without loss of consciousness, initial encounter: Secondary | ICD-10-CM | POA: Diagnosis not present

## 2012-01-10 DIAGNOSIS — S064X0A Epidural hemorrhage without loss of consciousness, initial encounter: Secondary | ICD-10-CM | POA: Diagnosis not present

## 2012-01-10 DIAGNOSIS — R269 Unspecified abnormalities of gait and mobility: Secondary | ICD-10-CM | POA: Diagnosis not present

## 2012-01-10 DIAGNOSIS — S36113A Laceration of liver, unspecified degree, initial encounter: Secondary | ICD-10-CM | POA: Diagnosis not present

## 2012-01-10 DIAGNOSIS — IMO0002 Reserved for concepts with insufficient information to code with codable children: Secondary | ICD-10-CM | POA: Diagnosis not present

## 2012-01-10 DIAGNOSIS — IMO0001 Reserved for inherently not codable concepts without codable children: Secondary | ICD-10-CM | POA: Diagnosis not present

## 2012-01-12 DIAGNOSIS — S064X0A Epidural hemorrhage without loss of consciousness, initial encounter: Secondary | ICD-10-CM | POA: Diagnosis not present

## 2012-01-12 DIAGNOSIS — IMO0001 Reserved for inherently not codable concepts without codable children: Secondary | ICD-10-CM | POA: Diagnosis not present

## 2012-01-12 DIAGNOSIS — IMO0002 Reserved for concepts with insufficient information to code with codable children: Secondary | ICD-10-CM | POA: Diagnosis not present

## 2012-01-12 DIAGNOSIS — S36113A Laceration of liver, unspecified degree, initial encounter: Secondary | ICD-10-CM | POA: Diagnosis not present

## 2012-01-12 DIAGNOSIS — R269 Unspecified abnormalities of gait and mobility: Secondary | ICD-10-CM | POA: Diagnosis not present

## 2012-01-13 DIAGNOSIS — S064X0A Epidural hemorrhage without loss of consciousness, initial encounter: Secondary | ICD-10-CM | POA: Diagnosis not present

## 2012-01-13 DIAGNOSIS — IMO0002 Reserved for concepts with insufficient information to code with codable children: Secondary | ICD-10-CM | POA: Diagnosis not present

## 2012-01-13 DIAGNOSIS — IMO0001 Reserved for inherently not codable concepts without codable children: Secondary | ICD-10-CM | POA: Diagnosis not present

## 2012-01-13 DIAGNOSIS — R269 Unspecified abnormalities of gait and mobility: Secondary | ICD-10-CM | POA: Diagnosis not present

## 2012-01-13 DIAGNOSIS — S36113A Laceration of liver, unspecified degree, initial encounter: Secondary | ICD-10-CM | POA: Diagnosis not present

## 2012-01-14 DIAGNOSIS — S064X0A Epidural hemorrhage without loss of consciousness, initial encounter: Secondary | ICD-10-CM | POA: Diagnosis not present

## 2012-01-14 DIAGNOSIS — R269 Unspecified abnormalities of gait and mobility: Secondary | ICD-10-CM | POA: Diagnosis not present

## 2012-01-14 DIAGNOSIS — IMO0002 Reserved for concepts with insufficient information to code with codable children: Secondary | ICD-10-CM | POA: Diagnosis not present

## 2012-01-14 DIAGNOSIS — S36113A Laceration of liver, unspecified degree, initial encounter: Secondary | ICD-10-CM | POA: Diagnosis not present

## 2012-01-14 DIAGNOSIS — IMO0001 Reserved for inherently not codable concepts without codable children: Secondary | ICD-10-CM | POA: Diagnosis not present

## 2012-01-17 DIAGNOSIS — IMO0001 Reserved for inherently not codable concepts without codable children: Secondary | ICD-10-CM | POA: Diagnosis not present

## 2012-01-17 DIAGNOSIS — S064X0A Epidural hemorrhage without loss of consciousness, initial encounter: Secondary | ICD-10-CM | POA: Diagnosis not present

## 2012-01-17 DIAGNOSIS — R269 Unspecified abnormalities of gait and mobility: Secondary | ICD-10-CM | POA: Diagnosis not present

## 2012-01-17 DIAGNOSIS — IMO0002 Reserved for concepts with insufficient information to code with codable children: Secondary | ICD-10-CM | POA: Diagnosis not present

## 2012-01-17 DIAGNOSIS — S36113A Laceration of liver, unspecified degree, initial encounter: Secondary | ICD-10-CM | POA: Diagnosis not present

## 2012-01-18 DIAGNOSIS — M1710 Unilateral primary osteoarthritis, unspecified knee: Secondary | ICD-10-CM | POA: Diagnosis not present

## 2012-01-18 DIAGNOSIS — S064X0A Epidural hemorrhage without loss of consciousness, initial encounter: Secondary | ICD-10-CM | POA: Diagnosis not present

## 2012-01-18 DIAGNOSIS — S36113A Laceration of liver, unspecified degree, initial encounter: Secondary | ICD-10-CM | POA: Diagnosis not present

## 2012-01-18 DIAGNOSIS — IMO0001 Reserved for inherently not codable concepts without codable children: Secondary | ICD-10-CM | POA: Diagnosis not present

## 2012-01-18 DIAGNOSIS — IMO0002 Reserved for concepts with insufficient information to code with codable children: Secondary | ICD-10-CM | POA: Diagnosis not present

## 2012-01-18 DIAGNOSIS — R269 Unspecified abnormalities of gait and mobility: Secondary | ICD-10-CM | POA: Diagnosis not present

## 2012-01-20 DIAGNOSIS — IMO0001 Reserved for inherently not codable concepts without codable children: Secondary | ICD-10-CM | POA: Diagnosis not present

## 2012-01-20 DIAGNOSIS — S064X0A Epidural hemorrhage without loss of consciousness, initial encounter: Secondary | ICD-10-CM | POA: Diagnosis not present

## 2012-01-20 DIAGNOSIS — IMO0002 Reserved for concepts with insufficient information to code with codable children: Secondary | ICD-10-CM | POA: Diagnosis not present

## 2012-01-20 DIAGNOSIS — S36113A Laceration of liver, unspecified degree, initial encounter: Secondary | ICD-10-CM | POA: Diagnosis not present

## 2012-01-20 DIAGNOSIS — R269 Unspecified abnormalities of gait and mobility: Secondary | ICD-10-CM | POA: Diagnosis not present

## 2012-01-21 DIAGNOSIS — IMO0002 Reserved for concepts with insufficient information to code with codable children: Secondary | ICD-10-CM | POA: Diagnosis not present

## 2012-01-21 DIAGNOSIS — S36113A Laceration of liver, unspecified degree, initial encounter: Secondary | ICD-10-CM | POA: Diagnosis not present

## 2012-01-21 DIAGNOSIS — S064X0A Epidural hemorrhage without loss of consciousness, initial encounter: Secondary | ICD-10-CM | POA: Diagnosis not present

## 2012-01-21 DIAGNOSIS — IMO0001 Reserved for inherently not codable concepts without codable children: Secondary | ICD-10-CM | POA: Diagnosis not present

## 2012-01-21 DIAGNOSIS — R269 Unspecified abnormalities of gait and mobility: Secondary | ICD-10-CM | POA: Diagnosis not present

## 2012-01-24 DIAGNOSIS — IMO0001 Reserved for inherently not codable concepts without codable children: Secondary | ICD-10-CM | POA: Diagnosis not present

## 2012-01-24 DIAGNOSIS — IMO0002 Reserved for concepts with insufficient information to code with codable children: Secondary | ICD-10-CM | POA: Diagnosis not present

## 2012-01-24 DIAGNOSIS — S064X0A Epidural hemorrhage without loss of consciousness, initial encounter: Secondary | ICD-10-CM | POA: Diagnosis not present

## 2012-01-24 DIAGNOSIS — R269 Unspecified abnormalities of gait and mobility: Secondary | ICD-10-CM | POA: Diagnosis not present

## 2012-01-24 DIAGNOSIS — S36113A Laceration of liver, unspecified degree, initial encounter: Secondary | ICD-10-CM | POA: Diagnosis not present

## 2012-01-26 DIAGNOSIS — M1710 Unilateral primary osteoarthritis, unspecified knee: Secondary | ICD-10-CM | POA: Diagnosis not present

## 2012-01-28 DIAGNOSIS — S36113A Laceration of liver, unspecified degree, initial encounter: Secondary | ICD-10-CM | POA: Diagnosis not present

## 2012-01-28 DIAGNOSIS — IMO0001 Reserved for inherently not codable concepts without codable children: Secondary | ICD-10-CM | POA: Diagnosis not present

## 2012-01-28 DIAGNOSIS — S064X0A Epidural hemorrhage without loss of consciousness, initial encounter: Secondary | ICD-10-CM | POA: Diagnosis not present

## 2012-01-28 DIAGNOSIS — R269 Unspecified abnormalities of gait and mobility: Secondary | ICD-10-CM | POA: Diagnosis not present

## 2012-01-28 DIAGNOSIS — IMO0002 Reserved for concepts with insufficient information to code with codable children: Secondary | ICD-10-CM | POA: Diagnosis not present

## 2012-02-21 DIAGNOSIS — Z23 Encounter for immunization: Secondary | ICD-10-CM | POA: Diagnosis not present

## 2012-02-22 DIAGNOSIS — M545 Low back pain: Secondary | ICD-10-CM | POA: Diagnosis not present

## 2012-02-22 DIAGNOSIS — M25569 Pain in unspecified knee: Secondary | ICD-10-CM | POA: Diagnosis not present

## 2012-02-23 DIAGNOSIS — M542 Cervicalgia: Secondary | ICD-10-CM | POA: Diagnosis not present

## 2012-03-21 DIAGNOSIS — M1710 Unilateral primary osteoarthritis, unspecified knee: Secondary | ICD-10-CM | POA: Diagnosis not present

## 2012-03-21 DIAGNOSIS — M542 Cervicalgia: Secondary | ICD-10-CM | POA: Diagnosis not present

## 2012-03-23 DIAGNOSIS — M542 Cervicalgia: Secondary | ICD-10-CM | POA: Diagnosis not present

## 2012-03-27 DIAGNOSIS — H25019 Cortical age-related cataract, unspecified eye: Secondary | ICD-10-CM | POA: Diagnosis not present

## 2012-03-27 DIAGNOSIS — H2510 Age-related nuclear cataract, unspecified eye: Secondary | ICD-10-CM | POA: Diagnosis not present

## 2012-04-06 DIAGNOSIS — Z1231 Encounter for screening mammogram for malignant neoplasm of breast: Secondary | ICD-10-CM | POA: Diagnosis not present

## 2012-08-17 DIAGNOSIS — E039 Hypothyroidism, unspecified: Secondary | ICD-10-CM | POA: Diagnosis not present

## 2012-08-17 DIAGNOSIS — Z0000 Encounter for general adult medical examination without abnormal findings: Secondary | ICD-10-CM | POA: Diagnosis not present

## 2012-08-17 DIAGNOSIS — E7800 Pure hypercholesterolemia, unspecified: Secondary | ICD-10-CM | POA: Diagnosis not present

## 2012-08-17 DIAGNOSIS — Z79899 Other long term (current) drug therapy: Secondary | ICD-10-CM | POA: Diagnosis not present

## 2012-08-17 DIAGNOSIS — M899 Disorder of bone, unspecified: Secondary | ICD-10-CM | POA: Diagnosis not present

## 2012-08-29 DIAGNOSIS — Z0000 Encounter for general adult medical examination without abnormal findings: Secondary | ICD-10-CM | POA: Diagnosis not present

## 2012-08-31 DIAGNOSIS — M899 Disorder of bone, unspecified: Secondary | ICD-10-CM | POA: Diagnosis not present

## 2012-08-31 DIAGNOSIS — E7800 Pure hypercholesterolemia, unspecified: Secondary | ICD-10-CM | POA: Diagnosis not present

## 2012-08-31 DIAGNOSIS — L570 Actinic keratosis: Secondary | ICD-10-CM | POA: Diagnosis not present

## 2012-08-31 DIAGNOSIS — M545 Low back pain: Secondary | ICD-10-CM | POA: Diagnosis not present

## 2012-08-31 DIAGNOSIS — M949 Disorder of cartilage, unspecified: Secondary | ICD-10-CM | POA: Diagnosis not present

## 2012-08-31 DIAGNOSIS — E041 Nontoxic single thyroid nodule: Secondary | ICD-10-CM | POA: Diagnosis not present

## 2012-09-26 DIAGNOSIS — M8440XA Pathological fracture, unspecified site, initial encounter for fracture: Secondary | ICD-10-CM | POA: Diagnosis not present

## 2012-09-26 DIAGNOSIS — E7800 Pure hypercholesterolemia, unspecified: Secondary | ICD-10-CM | POA: Diagnosis not present

## 2013-03-28 DIAGNOSIS — H2510 Age-related nuclear cataract, unspecified eye: Secondary | ICD-10-CM | POA: Diagnosis not present

## 2013-03-28 DIAGNOSIS — H25019 Cortical age-related cataract, unspecified eye: Secondary | ICD-10-CM | POA: Diagnosis not present

## 2013-04-24 DIAGNOSIS — Z1231 Encounter for screening mammogram for malignant neoplasm of breast: Secondary | ICD-10-CM | POA: Diagnosis not present

## 2013-05-24 DIAGNOSIS — L821 Other seborrheic keratosis: Secondary | ICD-10-CM | POA: Diagnosis not present

## 2013-05-24 DIAGNOSIS — D1801 Hemangioma of skin and subcutaneous tissue: Secondary | ICD-10-CM | POA: Diagnosis not present

## 2013-05-24 DIAGNOSIS — L819 Disorder of pigmentation, unspecified: Secondary | ICD-10-CM | POA: Diagnosis not present

## 2013-05-24 DIAGNOSIS — L570 Actinic keratosis: Secondary | ICD-10-CM | POA: Diagnosis not present

## 2013-06-29 DIAGNOSIS — R509 Fever, unspecified: Secondary | ICD-10-CM | POA: Diagnosis not present

## 2013-06-29 DIAGNOSIS — R197 Diarrhea, unspecified: Secondary | ICD-10-CM | POA: Diagnosis not present

## 2013-06-29 DIAGNOSIS — R1032 Left lower quadrant pain: Secondary | ICD-10-CM | POA: Diagnosis not present

## 2013-08-28 DIAGNOSIS — Z23 Encounter for immunization: Secondary | ICD-10-CM | POA: Diagnosis not present

## 2013-08-28 DIAGNOSIS — Z0000 Encounter for general adult medical examination without abnormal findings: Secondary | ICD-10-CM | POA: Diagnosis not present

## 2013-08-28 DIAGNOSIS — Z7982 Long term (current) use of aspirin: Secondary | ICD-10-CM | POA: Diagnosis not present

## 2013-08-28 DIAGNOSIS — E7800 Pure hypercholesterolemia, unspecified: Secondary | ICD-10-CM | POA: Diagnosis not present

## 2013-09-17 DIAGNOSIS — E340 Carcinoid syndrome: Secondary | ICD-10-CM | POA: Diagnosis not present

## 2013-09-17 DIAGNOSIS — Z862 Personal history of diseases of the blood and blood-forming organs and certain disorders involving the immune mechanism: Secondary | ICD-10-CM | POA: Diagnosis not present

## 2013-09-17 DIAGNOSIS — E7800 Pure hypercholesterolemia, unspecified: Secondary | ICD-10-CM | POA: Diagnosis not present

## 2013-09-17 DIAGNOSIS — M4120 Other idiopathic scoliosis, site unspecified: Secondary | ICD-10-CM | POA: Diagnosis not present

## 2013-09-17 DIAGNOSIS — T887XXA Unspecified adverse effect of drug or medicament, initial encounter: Secondary | ICD-10-CM | POA: Diagnosis not present

## 2013-09-17 DIAGNOSIS — Z8639 Personal history of other endocrine, nutritional and metabolic disease: Secondary | ICD-10-CM | POA: Diagnosis not present

## 2013-09-17 DIAGNOSIS — Z8249 Family history of ischemic heart disease and other diseases of the circulatory system: Secondary | ICD-10-CM | POA: Diagnosis not present

## 2013-09-17 DIAGNOSIS — M810 Age-related osteoporosis without current pathological fracture: Secondary | ICD-10-CM | POA: Diagnosis not present

## 2013-09-17 DIAGNOSIS — M949 Disorder of cartilage, unspecified: Secondary | ICD-10-CM | POA: Diagnosis not present

## 2013-09-17 DIAGNOSIS — E041 Nontoxic single thyroid nodule: Secondary | ICD-10-CM | POA: Diagnosis not present

## 2013-09-17 DIAGNOSIS — M899 Disorder of bone, unspecified: Secondary | ICD-10-CM | POA: Diagnosis not present

## 2013-09-17 DIAGNOSIS — Z7982 Long term (current) use of aspirin: Secondary | ICD-10-CM | POA: Diagnosis not present

## 2013-09-19 DIAGNOSIS — M810 Age-related osteoporosis without current pathological fracture: Secondary | ICD-10-CM | POA: Diagnosis not present

## 2013-09-20 DIAGNOSIS — Z1212 Encounter for screening for malignant neoplasm of rectum: Secondary | ICD-10-CM | POA: Diagnosis not present

## 2014-01-02 DIAGNOSIS — Z23 Encounter for immunization: Secondary | ICD-10-CM | POA: Diagnosis not present

## 2014-03-21 DIAGNOSIS — M810 Age-related osteoporosis without current pathological fracture: Secondary | ICD-10-CM | POA: Diagnosis not present

## 2014-04-02 DIAGNOSIS — H2513 Age-related nuclear cataract, bilateral: Secondary | ICD-10-CM | POA: Diagnosis not present

## 2014-04-25 DIAGNOSIS — Z1231 Encounter for screening mammogram for malignant neoplasm of breast: Secondary | ICD-10-CM | POA: Diagnosis not present

## 2014-05-20 DIAGNOSIS — J069 Acute upper respiratory infection, unspecified: Secondary | ICD-10-CM | POA: Diagnosis not present

## 2014-05-30 DIAGNOSIS — Z85828 Personal history of other malignant neoplasm of skin: Secondary | ICD-10-CM | POA: Diagnosis not present

## 2014-05-30 DIAGNOSIS — L821 Other seborrheic keratosis: Secondary | ICD-10-CM | POA: Diagnosis not present

## 2014-05-30 DIAGNOSIS — B070 Plantar wart: Secondary | ICD-10-CM | POA: Diagnosis not present

## 2014-05-30 DIAGNOSIS — D225 Melanocytic nevi of trunk: Secondary | ICD-10-CM | POA: Diagnosis not present

## 2014-05-30 DIAGNOSIS — D1801 Hemangioma of skin and subcutaneous tissue: Secondary | ICD-10-CM | POA: Diagnosis not present

## 2014-05-30 DIAGNOSIS — L812 Freckles: Secondary | ICD-10-CM | POA: Diagnosis not present

## 2014-06-10 ENCOUNTER — Encounter: Payer: Self-pay | Admitting: Gastroenterology

## 2014-09-19 DIAGNOSIS — M810 Age-related osteoporosis without current pathological fracture: Secondary | ICD-10-CM | POA: Diagnosis not present

## 2014-10-16 DIAGNOSIS — Z0001 Encounter for general adult medical examination with abnormal findings: Secondary | ICD-10-CM | POA: Diagnosis not present

## 2014-10-16 DIAGNOSIS — E780 Pure hypercholesterolemia: Secondary | ICD-10-CM | POA: Diagnosis not present

## 2014-10-16 DIAGNOSIS — M810 Age-related osteoporosis without current pathological fracture: Secondary | ICD-10-CM | POA: Diagnosis not present

## 2014-10-16 DIAGNOSIS — E041 Nontoxic single thyroid nodule: Secondary | ICD-10-CM | POA: Diagnosis not present

## 2014-10-16 DIAGNOSIS — E559 Vitamin D deficiency, unspecified: Secondary | ICD-10-CM | POA: Diagnosis not present

## 2014-10-16 DIAGNOSIS — M8580 Other specified disorders of bone density and structure, unspecified site: Secondary | ICD-10-CM | POA: Diagnosis not present

## 2014-10-16 DIAGNOSIS — Z0000 Encounter for general adult medical examination without abnormal findings: Secondary | ICD-10-CM | POA: Diagnosis not present

## 2014-10-29 DIAGNOSIS — E780 Pure hypercholesterolemia: Secondary | ICD-10-CM | POA: Diagnosis not present

## 2014-10-29 DIAGNOSIS — E041 Nontoxic single thyroid nodule: Secondary | ICD-10-CM | POA: Diagnosis not present

## 2014-10-29 DIAGNOSIS — M4120 Other idiopathic scoliosis, site unspecified: Secondary | ICD-10-CM | POA: Diagnosis not present

## 2014-10-29 DIAGNOSIS — Z7189 Other specified counseling: Secondary | ICD-10-CM | POA: Diagnosis not present

## 2014-11-13 DIAGNOSIS — Z23 Encounter for immunization: Secondary | ICD-10-CM | POA: Diagnosis not present

## 2015-01-22 DIAGNOSIS — Z23 Encounter for immunization: Secondary | ICD-10-CM | POA: Diagnosis not present

## 2015-02-17 ENCOUNTER — Encounter: Payer: Self-pay | Admitting: Gastroenterology

## 2015-03-04 DIAGNOSIS — B070 Plantar wart: Secondary | ICD-10-CM | POA: Diagnosis not present

## 2015-03-04 DIAGNOSIS — L602 Onychogryphosis: Secondary | ICD-10-CM | POA: Diagnosis not present

## 2015-03-21 DIAGNOSIS — M810 Age-related osteoporosis without current pathological fracture: Secondary | ICD-10-CM | POA: Diagnosis not present

## 2015-04-08 DIAGNOSIS — H2513 Age-related nuclear cataract, bilateral: Secondary | ICD-10-CM | POA: Diagnosis not present

## 2015-04-25 ENCOUNTER — Ambulatory Visit (AMBULATORY_SURGERY_CENTER): Payer: Medicare Other | Admitting: *Deleted

## 2015-04-25 VITALS — Ht 62.5 in | Wt 122.8 lb

## 2015-04-25 DIAGNOSIS — Z86010 Personal history of colonic polyps: Secondary | ICD-10-CM

## 2015-04-25 MED ORDER — NA SULFATE-K SULFATE-MG SULF 17.5-3.13-1.6 GM/177ML PO SOLN: 1.0000 | Freq: Once | ORAL | Status: DC

## 2015-04-25 NOTE — Progress Notes (Signed)
No egg or soy allergy known to patient  No issues with past sedation with any surgeries  or procedures, no intubation problems  No diet pills No home 02 use per patient   emmi video     [email or web address]  After last colon in 2006 pt developed an infection post procedure that had to be treated with antibiotics

## 2015-05-06 DIAGNOSIS — Z1231 Encounter for screening mammogram for malignant neoplasm of breast: Secondary | ICD-10-CM | POA: Diagnosis not present

## 2015-05-09 ENCOUNTER — Ambulatory Visit (AMBULATORY_SURGERY_CENTER): Payer: Medicare Other | Admitting: Gastroenterology

## 2015-05-09 ENCOUNTER — Encounter: Payer: Self-pay | Admitting: Gastroenterology

## 2015-05-09 VITALS — BP 145/59 | HR 76 | Temp 98.2°F | Resp 33 | Ht 62.0 in | Wt 122.0 lb

## 2015-05-09 DIAGNOSIS — Z86010 Personal history of colonic polyps: Secondary | ICD-10-CM | POA: Diagnosis not present

## 2015-05-09 DIAGNOSIS — D122 Benign neoplasm of ascending colon: Secondary | ICD-10-CM | POA: Diagnosis not present

## 2015-05-09 DIAGNOSIS — Z1211 Encounter for screening for malignant neoplasm of colon: Secondary | ICD-10-CM

## 2015-05-09 MED ORDER — SODIUM CHLORIDE 0.9 % IV SOLN: 500.0000 mL | INTRAVENOUS | Status: DC

## 2015-05-09 NOTE — Op Note (Signed)
[place]  [employer] [place], [address]   COLONOSCOPY PROCEDURE REPORT  PATIENT: [name] [name], [name] [name]  MR#: [id number] BIRTHDATE: 11/18/1934 , 80  yrs. old GENDER: female ENDOSCOPIST: Ladene Artist, MD, Marval Regal REFERRED BY:   Alden Server, M.D. PROCEDURE DATE:  05/09/2015 PROCEDURE:   Colonoscopy, screening and Colonoscopy with snare polypectomy First Screening Colonoscopy - Avg.  risk and is 50 yrs.  old or older - No.  Prior Negative Screening - Now for repeat screening. 10 or more years since last screening  History of Adenoma - Now for follow-up colonoscopy & has been > or = to 3 yrs.  N/A  Polyps removed today? Yes ASA CLASS:   Class II INDICATIONS:Screening for colonic neoplasia and Colorectal Neoplasm Risk Assessment for this procedure is average risk. MEDICATIONS: Monitored anesthesia care and Propofol 150 mg IV DESCRIPTION OF PROCEDURE:   After the risks benefits and alternatives of the procedure were thoroughly explained, informed consent was obtained.  The digital rectal exam revealed no abnormalities of the rectum.   The LB [id number]-H190 [id number]  endoscope was introduced through the anus and advanced to the cecum, which was identified by both the appendix and ileocecal valve. No adverse events experienced.   The quality of the prep was good.  (Suprep was used)  The instrument was then slowly withdrawn as the colon was fully examined. Estimated blood loss is zero unless otherwise noted in this procedure report.    COLON FINDINGS: A sessile polyp measuring 7 mm in size was found in the ascending colon.  A polypectomy was performed with a cold snare.  The resection was complete, the polyp tissue was completely retrieved and sent to histology.   The examination was otherwise normal.  Retroflexed views revealed no abnormalities. The time to cecum = 3.0 Withdrawal time = 8.5   The scope was withdrawn and the procedure completed. COMPLICATIONS: There were no  immediate complications.  ENDOSCOPIC IMPRESSION: 1.   Sessile polyp in the ascending colon; polypectomy performed with a cold snare 2.   The examination was otherwise normal  RECOMMENDATIONS: 1.  Await pathology results 2.  Given your age, you will not need another colonoscopy for colon cancer screening or polyp surveillance.  These types of tests usually stop around the age 58.  eSigned:  Ladene Artist, MD, [facility] 05/09/2015 9:42 AM

## 2015-05-09 NOTE — Progress Notes (Signed)
Report to PACU, RN, vss, BBS= Clear.  

## 2015-05-09 NOTE — Progress Notes (Signed)
Called to room to assist during endoscopic procedure.  Patient ID and intended procedure confirmed with present staff. Received instructions for my participation in the procedure from the performing physician.  

## 2015-05-09 NOTE — Patient Instructions (Signed)
YOU HAD AN ENDOSCOPIC PROCEDURE TODAY AT [facility]:   Refer to the procedure report that was given to you for any specific questions about what was found during the examination.  If the procedure report does not answer your questions, please call your gastroenterologist to clarify.  If you requested that your care partner not be given the details of your procedure findings, then the procedure report has been included in a sealed envelope for you to review at your convenience later.  YOU SHOULD EXPECT: Some feelings of bloating in the abdomen. Passage of more gas than usual.  Walking can help get rid of the air that was put into your GI tract during the procedure and reduce the bloating. If you had a lower endoscopy (such as a colonoscopy or flexible sigmoidoscopy) you may notice spotting of blood in your stool or on the toilet paper. If you underwent a bowel prep for your procedure, you may not have a normal bowel movement for a few days.  Please Note:  You might notice some irritation and congestion in your nose or some drainage.  This is from the oxygen used during your procedure.  There is no need for concern and it should clear up in a day or so.  SYMPTOMS TO REPORT IMMEDIATELY:   Following lower endoscopy (colonoscopy or flexible sigmoidoscopy):  Excessive amounts of blood in the stool  Significant tenderness or worsening of abdominal pains  Swelling of the abdomen that is new, acute  Fever of 100F or higher   For urgent or emergent issues, a gastroenterologist can be reached at any hour by calling [phone].   DIET: Your first meal following the procedure should be a small meal and then it is ok to progress to your normal diet. Heavy or fried foods are harder to digest and may make you feel nauseous or bloated.  Likewise, meals heavy in dairy and vegetables can increase bloating.  Drink plenty of fluids but you should avoid alcoholic beverages for 24  hours.  ACTIVITY:  You should plan to take it easy for the rest of today and you should NOT DRIVE or use heavy machinery until tomorrow (because of the sedation medicines used during the test).    FOLLOW UP: Our staff will call the number listed on your records the next business day following your procedure to check on you and address any questions or concerns that you may have regarding the information given to you following your procedure. If we do not reach you, we will leave a message.  However, if you are feeling well and you are not experiencing any problems, there is no need to return our call.  We will assume that you have returned to your regular daily activities without incident.  If any biopsies were taken you will be contacted by phone or by letter within the next 1-3 weeks.  Please call us at [phone] if you have not heard about the biopsies in 3 weeks.    SIGNATURES/CONFIDENTIALITY: You and/or your care partner have signed paperwork which will be entered into your electronic medical record.  These signatures attest to the fact that that the information above on your After Visit Summary has been reviewed and is understood.  Full responsibility of the confidentiality of this discharge information lies with you and/or your care-partner.  Read all of the handouts given to you by your recovery room nurse.  Thank-you for choosing [place] for your medical needs.

## 2015-05-09 NOTE — Progress Notes (Signed)
Discharge instructions given by Riki Sheer, LPN; Good feedback from patient and caregiver noted.

## 2015-05-12 ENCOUNTER — Telehealth: Payer: Self-pay | Admitting: *Deleted

## 2015-05-12 NOTE — Telephone Encounter (Signed)
  Follow up Call-  Call back number 05/09/2015  Post procedure Call Back phone  # [phone]  Permission to leave phone message Yes     Patient questions:  Do you have a fever, pain , or abdominal swelling? No. Pain Score  0 *  Have you tolerated food without any problems? Yes.    Have you been able to return to your normal activities? Yes.    Do you have any questions about your discharge instructions: Diet   No. Medications  No. Follow up visit  No.  Do you have questions or concerns about your Care? No.  Actions: * If pain score is 4 or above: No action needed, pain <4.

## 2015-05-19 DIAGNOSIS — Z23 Encounter for immunization: Secondary | ICD-10-CM | POA: Diagnosis not present

## 2015-05-19 DIAGNOSIS — H2513 Age-related nuclear cataract, bilateral: Secondary | ICD-10-CM | POA: Diagnosis not present

## 2015-05-19 DIAGNOSIS — H1789 Other corneal scars and opacities: Secondary | ICD-10-CM | POA: Diagnosis not present

## 2015-05-20 ENCOUNTER — Encounter: Payer: Self-pay | Admitting: Gastroenterology

## 2015-06-10 DIAGNOSIS — D1801 Hemangioma of skin and subcutaneous tissue: Secondary | ICD-10-CM | POA: Diagnosis not present

## 2015-06-10 DIAGNOSIS — L821 Other seborrheic keratosis: Secondary | ICD-10-CM | POA: Diagnosis not present

## 2015-06-10 DIAGNOSIS — L812 Freckles: Secondary | ICD-10-CM | POA: Diagnosis not present

## 2015-06-19 DIAGNOSIS — H2512 Age-related nuclear cataract, left eye: Secondary | ICD-10-CM | POA: Diagnosis not present

## 2015-06-19 DIAGNOSIS — H25812 Combined forms of age-related cataract, left eye: Secondary | ICD-10-CM | POA: Diagnosis not present

## 2015-08-11 DIAGNOSIS — H1789 Other corneal scars and opacities: Secondary | ICD-10-CM | POA: Diagnosis not present

## 2015-09-19 DIAGNOSIS — M810 Age-related osteoporosis without current pathological fracture: Secondary | ICD-10-CM | POA: Diagnosis not present

## 2015-10-16 DIAGNOSIS — H25811 Combined forms of age-related cataract, right eye: Secondary | ICD-10-CM | POA: Diagnosis not present

## 2015-10-16 DIAGNOSIS — H2511 Age-related nuclear cataract, right eye: Secondary | ICD-10-CM | POA: Diagnosis not present

## 2015-10-30 DIAGNOSIS — M859 Disorder of bone density and structure, unspecified: Secondary | ICD-10-CM | POA: Diagnosis not present

## 2015-10-30 DIAGNOSIS — E559 Vitamin D deficiency, unspecified: Secondary | ICD-10-CM | POA: Diagnosis not present

## 2015-10-30 DIAGNOSIS — E7800 Pure hypercholesterolemia, unspecified: Secondary | ICD-10-CM | POA: Diagnosis not present

## 2015-10-30 DIAGNOSIS — M8580 Other specified disorders of bone density and structure, unspecified site: Secondary | ICD-10-CM | POA: Diagnosis not present

## 2015-10-30 DIAGNOSIS — Z0000 Encounter for general adult medical examination without abnormal findings: Secondary | ICD-10-CM | POA: Diagnosis not present

## 2015-11-05 DIAGNOSIS — M8000XA Age-related osteoporosis with current pathological fracture, unspecified site, initial encounter for fracture: Secondary | ICD-10-CM | POA: Diagnosis not present

## 2015-11-05 DIAGNOSIS — E7800 Pure hypercholesterolemia, unspecified: Secondary | ICD-10-CM | POA: Diagnosis not present

## 2015-11-05 DIAGNOSIS — D487 Neoplasm of uncertain behavior of other specified sites: Secondary | ICD-10-CM | POA: Diagnosis not present

## 2015-11-05 DIAGNOSIS — R600 Localized edema: Secondary | ICD-10-CM | POA: Diagnosis not present

## 2015-11-25 DIAGNOSIS — M8580 Other specified disorders of bone density and structure, unspecified site: Secondary | ICD-10-CM | POA: Diagnosis not present

## 2015-11-28 DIAGNOSIS — R221 Localized swelling, mass and lump, neck: Secondary | ICD-10-CM | POA: Diagnosis not present

## 2015-11-29 ENCOUNTER — Other Ambulatory Visit: Payer: Self-pay | Admitting: Otolaryngology

## 2015-11-29 DIAGNOSIS — R221 Localized swelling, mass and lump, neck: Secondary | ICD-10-CM

## 2015-12-08 ENCOUNTER — Ambulatory Visit
Admission: RE | Admit: 2015-12-08 | Discharge: 2015-12-08 | Disposition: A | Discharge status: Home / Self Care | Payer: Medicare Other | Source: Ambulatory Visit | Attending: Otolaryngology | Admitting: Otolaryngology

## 2015-12-08 DIAGNOSIS — R221 Localized swelling, mass and lump, neck: Secondary | ICD-10-CM

## 2015-12-08 IMAGING — CT CT NECK W/ CM
3 of 6 series · 12 of 33 positions shown, 14 images · IV contrast (agent unspecified)
Comparison: Cervical spine radiographs 02/23/2012. Chest CT
08/20/2009.

CLINICAL DATA: 80-year-old female 80-year-old female with palpable
abnormality under the right jaw discovered 3 months ago. Neck mass.
Right lung cancer status post right middle lobectomy in 4336.

EXAM:
CT NECK WITH CONTRAST
TECHNIQUE: Multidetector CT imaging of the neck was performed using the
standard protocol following the bolus administration of intravenous
contrast.
CONTRAST:  75 mL Hsovue-UZZ

[Series 4: thins · axial · 0.38mm/px · z∈[+853,+1029]mm · 4 of 391 slices shown, 5 images]
[im 49/391  soft-tissue]
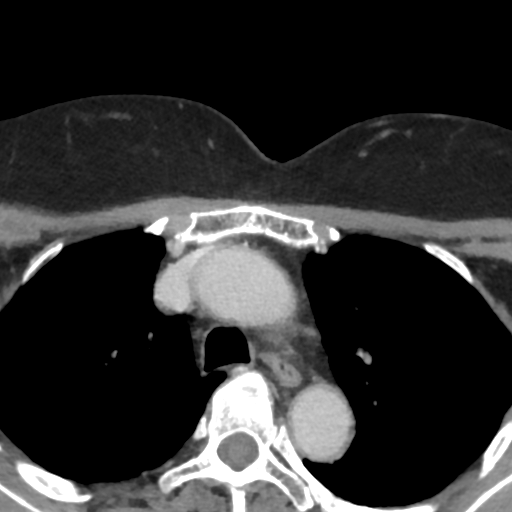
[im 49/391  bone]
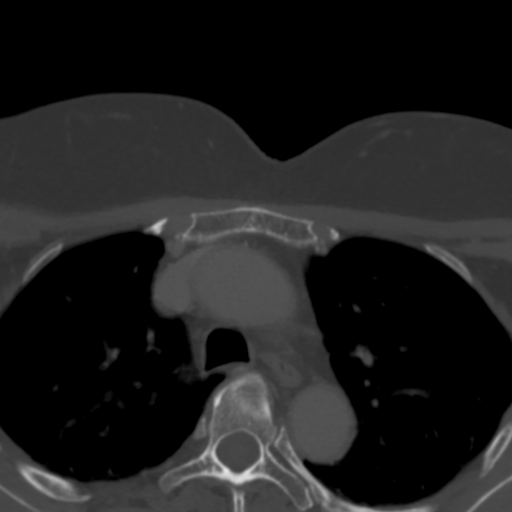
[im 147/391  bone]
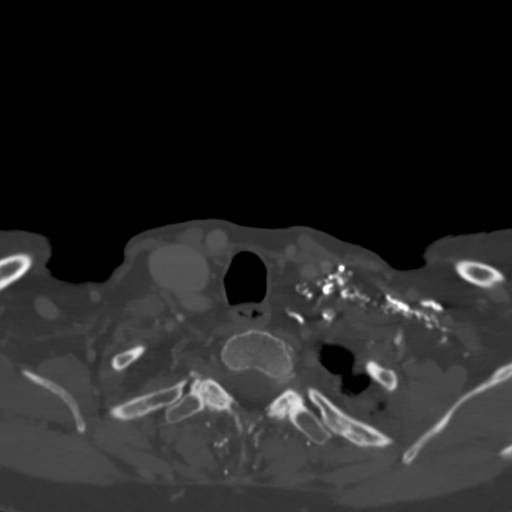
[im 244/391  bone]
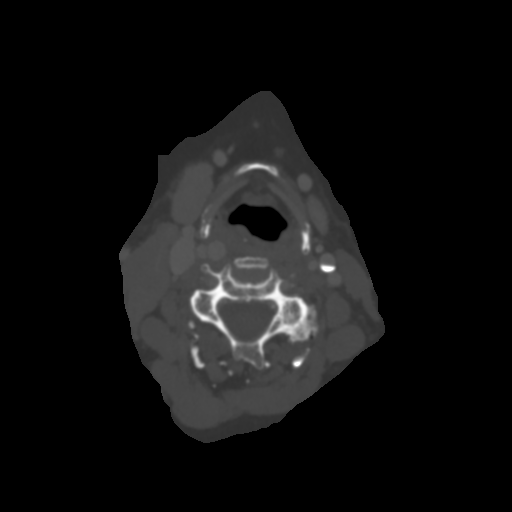
[im 342/391  bone]
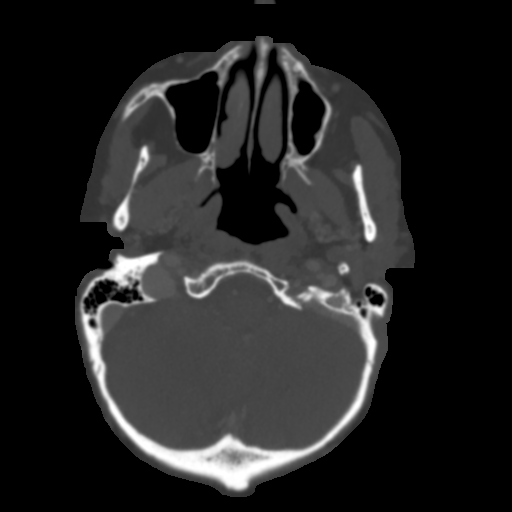

[Series 5: cor neck · coronal · 0.38mm/px · 3 of 66 slices shown]
[im 17/66  bone]
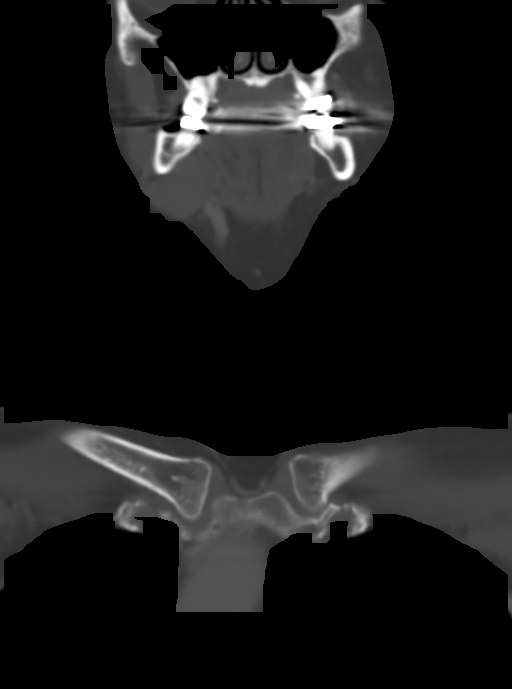
[im 28/66  bone]
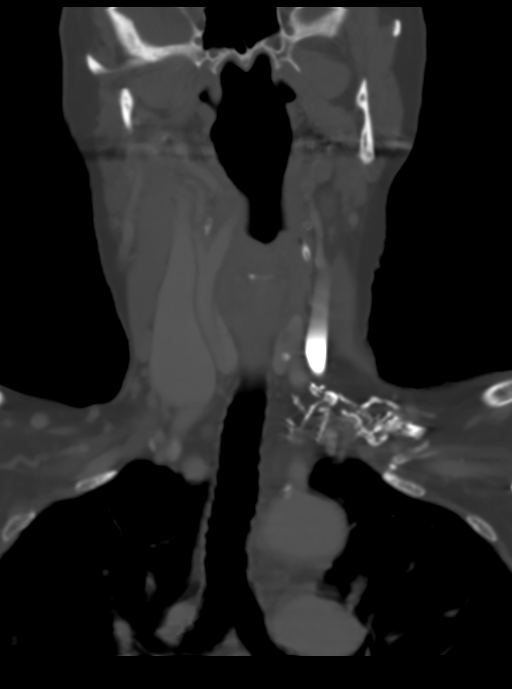
[im 39/66  bone]
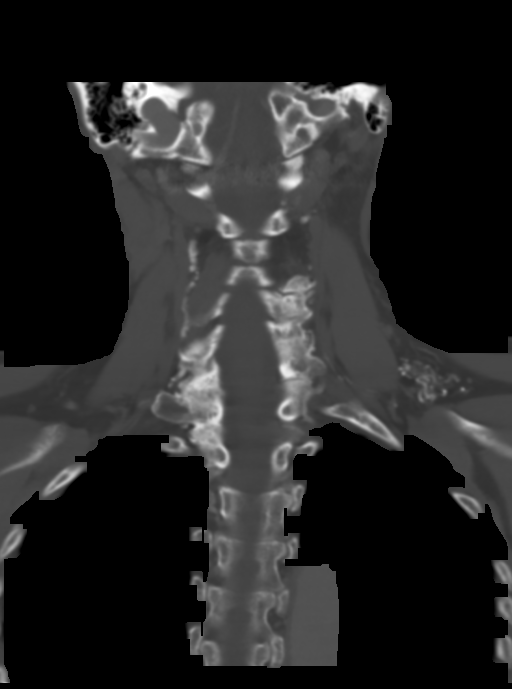

[Series 6: sag neck · sagittal · 0.38mm/px · 5 of 65 slices shown, 6 images]
[im 22/65  bone]
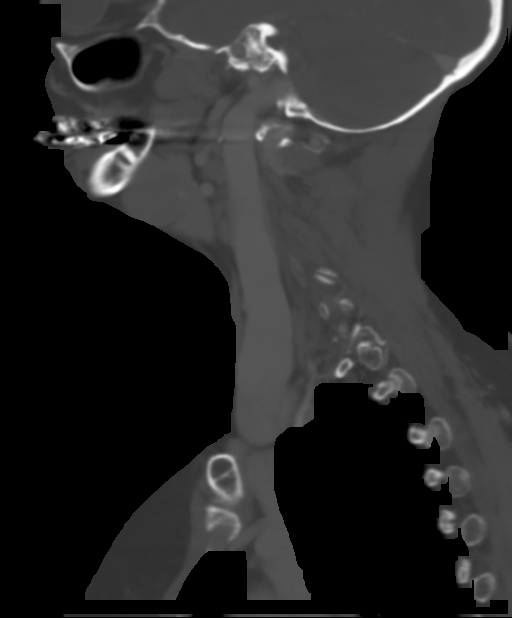
[im 27/65  bone]
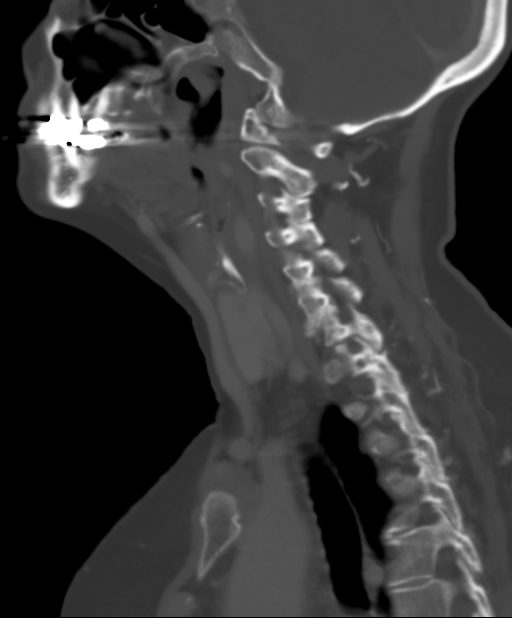
[im 33/65  soft-tissue]
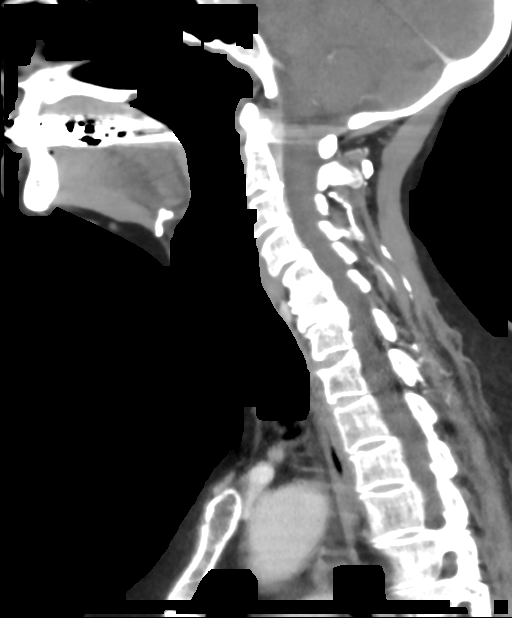
[im 33/65  bone]
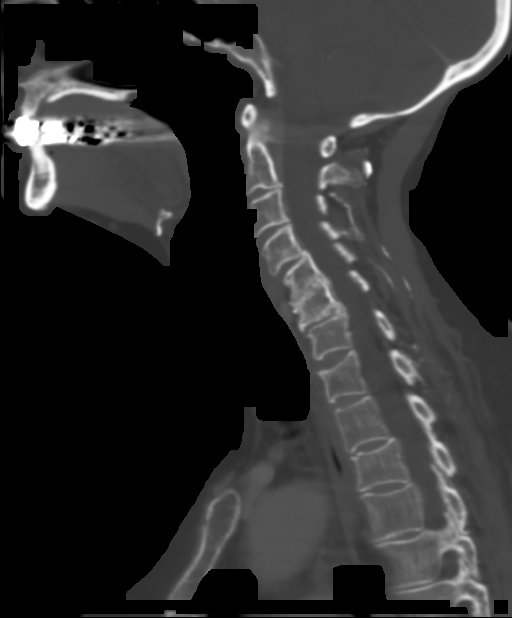
[im 38/65  bone]
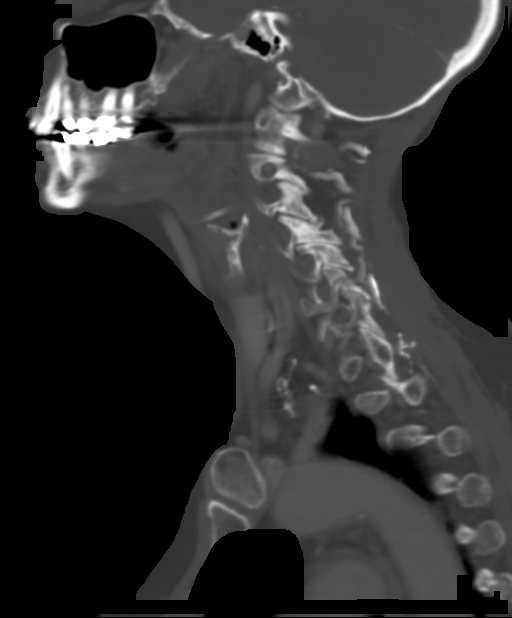
[im 43/65  bone]
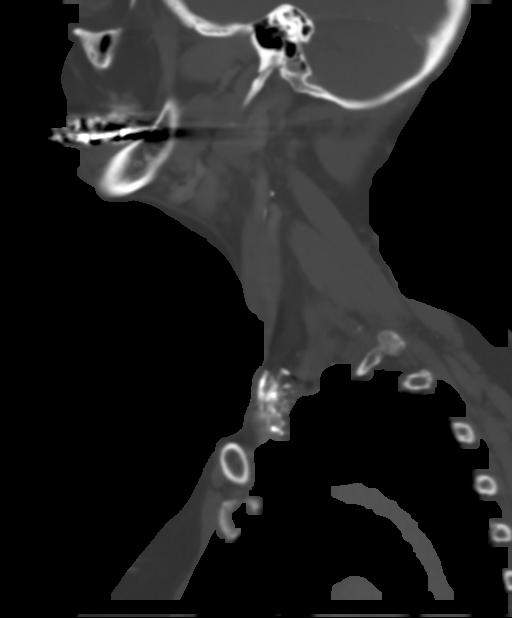

[12 of 33 positions shown; findings below may reference images not displayed]

FINDINGS: Pharynx and larynx: The larynx appears within normal limits, there
is slight asymmetry of the right laryngeal ventricle.

Pharyngeal soft tissue contours also appear within normal limits.
There is a retropharyngeal course of the right ICA which results in
mild asymmetry on the right side. The remainder of the
retropharyngeal space, and the bilateral parapharyngeal spaces
appear within normal limits.

Salivary glands: Mass effect on the right submandibular gland
related to right submandibular space soft tissue mass described in
the lymph nodes section below. Negative sublingual space. Left
submandibular gland and both parotid glands appear normal.

Thyroid: Negative.

Lymph nodes: The palpable area of concern marked just below the
right mandible corresponds to an oval soft tissue mass which is
adjacent to but seems to be separate from the right submandibular
gland which is being displaced medially (series 3, image 26). The
palpable mass measures 32 x 18 x 28 mm (AP by transverse by CC). A
branch of the nearby right external carotid artery courses into this
mass as seen on sagittal image 21.

In addition there is a large fungating right lateral neck mass which
is situated between the right sternocleidomastoid muscle, the
inferior portion of the right parotid gland, an the right carotid
space. This large mass encompasses 47 x 20 x 47 mm (AP by transverse
by CC). See sagittal image 19. It has irregular margins. Both this
an the right submandibular space mass have similar density which is
also nearly identical the skeletal muscle. Subsequently, this larger
mass is inseparable from the right sternocleidomastoid muscle.

Several much smaller but asymmetrically enlarged and conspicuous
right level 3 nodes are then identified below the dominant mass as
seen on sagittal image 19. The larger is 6-7 mm short axis.

No cystic or necrotic nodes. Left side cervical lymph nodes are all
diminutive.

Vascular: Major vascular structures in the neck and at the skullbase
remain patent. The dominant mass abuts along segment of the right
carotid space, but the adjacent right internal jugular vein does not
appear invaded or occluded. Mild Calcified atherosclerosis at the
skull base.

Limited intracranial: Negative.

Visualized orbits: Negative.

Mastoids and visualized paranasal sinuses: Clear.

Skeleton: The right submandibular space soft tissue mass abuts the
mandible but the mandible remains normal. Chronic cervical spine
degeneration. No acute or suspicious osseous lesion identified.

Upper chest: Negative visualized lung apices. No superior
mediastinal lymphadenopathy. No axillary lymphadenopathy.
IMPRESSION: 1. Palpable abnormality corresponds to a soft tissue mass measuring
up to 3.2 cm which most resembles an abnormal right level 1 B lymph
node. This medially displaces but seems tube be separate from the
adjacent right submandibular gland.
2. However, there is a larger spiculated and somewhat infiltrative
appearing right lateral neck mass centered at the level 2 nodal
station measuring up to 4.7 cm. This lesion is inseparable from the
undersurface of the right sternocleidomastoid muscle, but does not
appear to invade the subjacent right carotid space.
3. Associated small but asymmetrically enlarged and conspicuous
right level 3 lymph nodes. No primary pharyngeal or laryngeal tumor
identified.
4. Favored diagnosis therefore is Lymphoma. Differential
considerations include metastatic nodal disease from an unseen head
and neck primary, or from a regional skin cancer or other unknown
malignancy, and less likely sarcoma.

## 2015-12-08 MED ORDER — IOPAMIDOL (ISOVUE-300) INJECTION 61%: 75.0000 mL | Freq: Once | INTRAVENOUS | Status: DC | PRN

## 2015-12-12 DIAGNOSIS — R599 Enlarged lymph nodes, unspecified: Secondary | ICD-10-CM | POA: Diagnosis not present

## 2015-12-12 DIAGNOSIS — R221 Localized swelling, mass and lump, neck: Secondary | ICD-10-CM | POA: Diagnosis not present

## 2015-12-16 ENCOUNTER — Other Ambulatory Visit: Payer: Self-pay | Admitting: Otolaryngology

## 2015-12-17 ENCOUNTER — Encounter (HOSPITAL_COMMUNITY): Payer: Self-pay

## 2015-12-17 ENCOUNTER — Encounter (HOSPITAL_COMMUNITY)
Admission: RE | Admit: 2015-12-17 | Discharge: 2015-12-17 | Disposition: A | Discharge status: Home / Self Care | Payer: Medicare Other | Source: Ambulatory Visit | Attending: Otolaryngology | Admitting: Otolaryngology

## 2015-12-17 DIAGNOSIS — E785 Hyperlipidemia, unspecified: Secondary | ICD-10-CM | POA: Diagnosis not present

## 2015-12-17 DIAGNOSIS — Z7982 Long term (current) use of aspirin: Secondary | ICD-10-CM | POA: Diagnosis not present

## 2015-12-17 DIAGNOSIS — R221 Localized swelling, mass and lump, neck: Secondary | ICD-10-CM | POA: Diagnosis present

## 2015-12-17 DIAGNOSIS — C8211 Follicular lymphoma grade II, lymph nodes of head, face, and neck: Secondary | ICD-10-CM | POA: Diagnosis not present

## 2015-12-17 LAB — BASIC METABOLIC PANEL
Anion gap: 8 (ref 5–15)
BUN: 11 mg/dL (ref 6–20)
CALCIUM: 10.2 mg/dL (ref 8.9–10.3)
CO2: 28 mmol/L (ref 22–32)
CREATININE: 0.74 mg/dL (ref 0.44–1.00)
Chloride: 105 mmol/L (ref 101–111)
GLUCOSE: 94 mg/dL (ref 65–99)
Potassium: 4.3 mmol/L (ref 3.5–5.1)
Sodium: 141 mmol/L (ref 135–145)

## 2015-12-17 LAB — CBC
HEMATOCRIT: 46.2 % — AB (ref 36.0–46.0)
HEMOGLOBIN: 14.9 g/dL (ref 12.0–15.0)
MCH: 29.7 pg (ref 26.0–34.0)
MCHC: 32.3 g/dL (ref 30.0–36.0)
MCV: 92.2 fL (ref 78.0–100.0)
PLATELETS: 257 10*3/uL (ref 150–400)
RBC: 5.01 MIL/uL (ref 3.87–5.11)
RDW: 14.1 % (ref 11.5–15.5)
WBC: 8.2 10*3/uL (ref 4.0–10.5)

## 2015-12-17 NOTE — Progress Notes (Signed)
Patient denied having any cardiac or pulmonary issues  Nurse spoke with Dr. Kalman Shan (Anesthesia) concerning Forteo injections and Dr. Kalman Shan stated that patient did not need to stop taking Forteo injections before surgery. Patient informed of this.

## 2015-12-17 NOTE — Pre-Procedure Instructions (Signed)
[name] [name]  12/17/2015     Your procedure is scheduled on : Friday December 19, 2015 at 11:15 AM.  Report to [facility] Admitting at 9:15 AM.  Call this number if you have problems the morning of surgery: [phone]    Remember:  Do not eat food or drink liquids after midnight.  Take these medicines the morning of surgery with A SIP OF WATER : NONE   Stop taking any vitamins, herbal medications/supplements, NSAIDs, Ibuprofen, Advil, Motrin, Aleve, Fish Oil, etc today   Do not wear jewelry, make-up or nail polish.  Do not wear lotions, powders, or perfumes, or deoderant.  Do not shave 48 hours prior to surgery.    Do not bring valuables to the hospital.  [facility] is not responsible for any belongings or valuables.  Contacts, dentures or bridgework may not be worn into surgery.  Leave your suitcase in the car.  After surgery it may be brought to your room.  For patients admitted to the hospital, discharge time will be determined by your treatment team.  Patients discharged the day of surgery will not be allowed to drive home.   Name and phone number of your driver:   Special instructions:  Shower using CHG soap the night before and the morning of your surgery  Please read over the following fact sheets that you were given.

## 2015-12-19 ENCOUNTER — Ambulatory Visit (HOSPITAL_COMMUNITY)
Admission: RE | Admit: 2015-12-19 | Discharge: 2015-12-19 | Disposition: A | Discharge status: Home / Self Care | Payer: Medicare Other | Source: Ambulatory Visit | Attending: Otolaryngology | Admitting: Otolaryngology

## 2015-12-19 ENCOUNTER — Ambulatory Visit (HOSPITAL_COMMUNITY): Payer: Medicare Other | Admitting: Anesthesiology

## 2015-12-19 ENCOUNTER — Other Ambulatory Visit: Payer: Self-pay

## 2015-12-19 ENCOUNTER — Encounter (HOSPITAL_COMMUNITY): Payer: Self-pay | Admitting: *Deleted

## 2015-12-19 ENCOUNTER — Encounter (HOSPITAL_COMMUNITY)
Admission: RE | Disposition: A | Discharge status: Home / Self Care | Payer: Self-pay | Source: Ambulatory Visit | Attending: Otolaryngology

## 2015-12-19 DIAGNOSIS — R221 Localized swelling, mass and lump, neck: Secondary | ICD-10-CM

## 2015-12-19 DIAGNOSIS — E785 Hyperlipidemia, unspecified: Secondary | ICD-10-CM | POA: Diagnosis not present

## 2015-12-19 DIAGNOSIS — C8211 Follicular lymphoma grade II, lymph nodes of head, face, and neck: Secondary | ICD-10-CM | POA: Insufficient documentation

## 2015-12-19 DIAGNOSIS — R599 Enlarged lymph nodes, unspecified: Secondary | ICD-10-CM | POA: Diagnosis not present

## 2015-12-19 DIAGNOSIS — C8209 Follicular lymphoma grade I, extranodal and solid organ sites: Secondary | ICD-10-CM | POA: Diagnosis not present

## 2015-12-19 DIAGNOSIS — Z7982 Long term (current) use of aspirin: Secondary | ICD-10-CM | POA: Diagnosis not present

## 2015-12-19 HISTORY — PX: SUBMANDIBULAR GLAND EXCISION: SHX2456

## 2015-12-19 SURGERY — EXCISION, SUBMANDIBULAR GLAND
Anesthesia: General | Laterality: Right

## 2015-12-19 MED ORDER — HYDROMORPHONE HCL 1 MG/ML IJ SOLN: 0.2500 mg | INTRAMUSCULAR | Status: DC | PRN

## 2015-12-19 MED ORDER — FENTANYL CITRATE (PF) 100 MCG/2ML IJ SOLN
INTRAMUSCULAR | Status: DC | PRN
  Administered 2015-12-19 (×3): 25 ug via INTRAVENOUS
  Administered 2015-12-19: 50 ug via INTRAVENOUS

## 2015-12-19 MED ORDER — ONDANSETRON HCL 4 MG/2ML IJ SOLN
INTRAMUSCULAR | Status: DC | PRN
  Administered 2015-12-19: 4 mg via INTRAVENOUS

## 2015-12-19 MED ORDER — CHLORHEXIDINE GLUCONATE CLOTH 2 % EX PADS: 6.0000 | MEDICATED_PAD | Freq: Once | CUTANEOUS | Status: DC

## 2015-12-19 MED ORDER — HYDROCODONE-ACETAMINOPHEN 5-325 MG PO TABS: 1.0000 | ORAL_TABLET | Freq: Four times a day (QID) | ORAL | 0 refills | Status: AC | PRN

## 2015-12-19 MED ORDER — FENTANYL CITRATE (PF) 100 MCG/2ML IJ SOLN
INTRAMUSCULAR | Status: AC
  Filled 2015-12-19: qty 2

## 2015-12-19 MED ORDER — LACTATED RINGERS IV SOLN
INTRAVENOUS | Status: DC
  Administered 2015-12-19: 10:00:00 via INTRAVENOUS

## 2015-12-19 MED ORDER — LIDOCAINE HCL (CARDIAC) 20 MG/ML IV SOLN
INTRAVENOUS | Status: DC | PRN
Start: 2015-12-19 — End: 2015-12-19
  Administered 2015-12-19: 100 mg via INTRAVENOUS

## 2015-12-19 MED ORDER — MIDAZOLAM HCL 5 MG/5ML IJ SOLN
INTRAMUSCULAR | Status: DC | PRN
  Administered 2015-12-19 (×2): 0.5 mg via INTRAVENOUS

## 2015-12-19 MED ORDER — ONDANSETRON HCL 4 MG/2ML IJ SOLN
INTRAMUSCULAR | Status: AC
  Filled 2015-12-19: qty 2

## 2015-12-19 MED ORDER — ONDANSETRON HCL 4 MG/2ML IJ SOLN: 4.0000 mg | Freq: Once | INTRAMUSCULAR | Status: DC | PRN

## 2015-12-19 MED ORDER — LIDOCAINE-EPINEPHRINE 1 %-1:100000 IJ SOLN
INTRAMUSCULAR | Status: AC
  Filled 2015-12-19: qty 1

## 2015-12-19 MED ORDER — 0.9 % SODIUM CHLORIDE (POUR BTL) OPTIME
TOPICAL | Status: DC | PRN
  Administered 2015-12-19: 1000 mL

## 2015-12-19 MED ORDER — MEPERIDINE HCL 25 MG/ML IJ SOLN: 6.2500 mg | INTRAMUSCULAR | Status: DC | PRN

## 2015-12-19 MED ORDER — BACITRACIN ZINC 500 UNIT/GM EX OINT
TOPICAL_OINTMENT | CUTANEOUS | Status: AC
  Filled 2015-12-19: qty 28.35

## 2015-12-19 MED ORDER — LIDOCAINE-EPINEPHRINE 1 %-1:100000 IJ SOLN
INTRAMUSCULAR | Status: DC | PRN
  Administered 2015-12-19: 1 mL

## 2015-12-19 MED ORDER — LIDOCAINE 2% (20 MG/ML) 5 ML SYRINGE
INTRAMUSCULAR | Status: AC
  Filled 2015-12-19: qty 5

## 2015-12-19 MED ORDER — DEXAMETHASONE SODIUM PHOSPHATE 10 MG/ML IJ SOLN
10.0000 mg | Freq: Once | INTRAMUSCULAR | Status: AC
  Administered 2015-12-19: 10 mg via INTRAVENOUS
  Filled 2015-12-19: qty 1

## 2015-12-19 MED ORDER — PROPOFOL 10 MG/ML IV BOLUS
INTRAVENOUS | Status: AC
  Filled 2015-12-19: qty 40

## 2015-12-19 MED ORDER — PROPOFOL 10 MG/ML IV BOLUS
INTRAVENOUS | Status: DC | PRN
  Administered 2015-12-19: 130 mg via INTRAVENOUS

## 2015-12-19 MED ORDER — CEFAZOLIN SODIUM-DEXTROSE 2-4 GM/100ML-% IV SOLN
2.0000 g | INTRAVENOUS | Status: AC
  Administered 2015-12-19: 2 g via INTRAVENOUS
  Filled 2015-12-19: qty 100

## 2015-12-19 MED ORDER — MIDAZOLAM HCL 2 MG/2ML IJ SOLN
INTRAMUSCULAR | Status: AC
  Filled 2015-12-19: qty 2

## 2015-12-19 SURGICAL SUPPLY — 42 items
CANISTER SUCTION 2500CC (MISCELLANEOUS) ×3 IMPLANT
CLEANER TIP ELECTROSURG 2X2 (MISCELLANEOUS) ×3 IMPLANT
CONT SPEC 4OZ CLIKSEAL STRL BL (MISCELLANEOUS) ×3 IMPLANT
CORDS BIPOLAR (ELECTRODE) ×3 IMPLANT
COVER SURGICAL LIGHT HANDLE (MISCELLANEOUS) ×3 IMPLANT
DERMABOND ADVANCED (GAUZE/BANDAGES/DRESSINGS) ×2
DERMABOND ADVANCED .7 DNX12 (GAUZE/BANDAGES/DRESSINGS) ×1 IMPLANT
DRAIN CHANNEL 7F 3/4 FLAT (WOUND CARE) IMPLANT
DRAIN PENROSE 1/4X12 LTX STRL (WOUND CARE) IMPLANT
DRAIN SNY 10 ROU (WOUND CARE) IMPLANT
DRAPE SURG 17X23 STRL (DRAPES) ×3 IMPLANT
ELECT COATED BLADE 2.86 ST (ELECTRODE) ×3 IMPLANT
ELECT REM PT RETURN 9FT ADLT (ELECTROSURGICAL) ×3
ELECTRODE REM PT RTRN 9FT ADLT (ELECTROSURGICAL) ×1 IMPLANT
EVACUATOR SILICONE 100CC (DRAIN) IMPLANT
FORCEPS BIPOLAR SPETZLER 8 1.0 (NEUROSURGERY SUPPLIES) ×3 IMPLANT
GLOVE BIOGEL M 7.0 STRL (GLOVE) ×6 IMPLANT
GOWN STRL REUS W/ TWL LRG LVL3 (GOWN DISPOSABLE) ×2 IMPLANT
GOWN STRL REUS W/TWL LRG LVL3 (GOWN DISPOSABLE) ×6
KIT BASIN OR (CUSTOM PROCEDURE TRAY) ×3 IMPLANT
KIT ROOM TURNOVER OR (KITS) ×3 IMPLANT
NEEDLE HYPO 25GX1X1/2 BEV (NEEDLE) ×3 IMPLANT
NS IRRIG 1000ML POUR BTL (IV SOLUTION) ×3 IMPLANT
PAD ARMBOARD 7.5X6 YLW CONV (MISCELLANEOUS) ×6 IMPLANT
PENCIL BUTTON HOLSTER BLD 10FT (ELECTRODE) ×3 IMPLANT
PROBE NERVBE PRASS .33 (MISCELLANEOUS) ×3 IMPLANT
SPONGE INTESTINAL PEANUT (DISPOSABLE) ×3 IMPLANT
STAPLER VISISTAT 35W (STAPLE) ×3 IMPLANT
SUT CHROMIC 3 0 PS 2 (SUTURE) IMPLANT
SUT ETHILON 2 0 FS 18 (SUTURE) ×3 IMPLANT
SUT ETHILON 3 0 PS 1 (SUTURE) IMPLANT
SUT ETHILON 5 0 P 3 18 (SUTURE)
SUT MON AB 5-0 P3 18 (SUTURE) ×3 IMPLANT
SUT NYLON ETHILON 5-0 P-3 1X18 (SUTURE) IMPLANT
SUT SILK 2 0 REEL (SUTURE) IMPLANT
SUT SILK 2 0 SH CR/8 (SUTURE) ×3 IMPLANT
SUT SILK 3 0 REEL (SUTURE) ×3 IMPLANT
SUT SILK 4 0 TIES 17X18 (SUTURE) IMPLANT
SUT VIC AB 3-0 FS2 27 (SUTURE) IMPLANT
SUT VIC AB 5-0 PS2 18 (SUTURE) ×3 IMPLANT
SUT VICRYL 4-0 PS2 18IN ABS (SUTURE) ×3 IMPLANT
TRAY ENT MC OR (CUSTOM PROCEDURE TRAY) ×3 IMPLANT

## 2015-12-19 NOTE — Anesthesia Preprocedure Evaluation (Addendum)
Anesthesia Evaluation  Patient identified by MRN, date of birth, ID band Patient awake    Reviewed: Allergy & Precautions, NPO status , Patient's Chart, lab work & pertinent test results  Airway Mallampati: I  TM Distance: >3 FB Neck ROM: Full    Dental  (+) Teeth Intact, Missing, Dental Advisory Given, Caps   Pulmonary    Pulmonary exam normal        Cardiovascular Normal cardiovascular exam     Neuro/Psych    GI/Hepatic   Endo/Other    Renal/GU      Musculoskeletal   Abdominal   Peds  Hematology   Anesthesia Other Findings Upper front teeth crowned  Reproductive/Obstetrics                            Anesthesia Physical Anesthesia Plan  ASA: II  Anesthesia Plan: General   Post-op Pain Management:    Induction: Intravenous  Airway Management Planned: LMA  Additional Equipment:   Intra-op Plan:   Post-operative Plan: Extubation in OR  Informed Consent: I have reviewed the patients History and Physical, chart, labs and discussed the procedure including the risks, benefits and alternatives for the proposed anesthesia with the patient or authorized representative who has indicated his/her understanding and acceptance.     Plan Discussed with: CRNA and Surgeon  Anesthesia Plan Comments:         Anesthesia Quick Evaluation

## 2015-12-19 NOTE — Anesthesia Postprocedure Evaluation (Signed)
Anesthesia Post Note  Patient: [name] [name]  Procedure(s) Performed: Procedure(s) (LRB): BIOPSY OF RIGHT SUBMANDIBULAR MASS (Right)  Patient location during evaluation: PACU Anesthesia Type: General Level of consciousness: awake and alert Pain management: pain level controlled Vital Signs Assessment: post-procedure vital signs reviewed and stable Respiratory status: spontaneous breathing, nonlabored ventilation, respiratory function stable and patient connected to nasal cannula oxygen Cardiovascular status: blood pressure returned to baseline and stable Postop Assessment: no signs of nausea or vomiting Anesthetic complications: no    Last Vitals:  Vitals:   12/19/15 1310 12/19/15 1323  BP: (!) 137/95 (!) 150/74  Pulse:  72  Resp:    Temp:      Last Pain:  Vitals:   12/19/15 1323  PainSc: 0-No pain                 Taevon Aschoff DAVID

## 2015-12-19 NOTE — Anesthesia Procedure Notes (Signed)
Procedure Name: LMA Insertion Date/Time: 12/19/2015 11:40 AM Performed by: Terrill Mohr Pre-anesthesia Checklist: Emergency Drugs available, Patient identified, Suction available and Patient being monitored Patient Re-evaluated:Patient Re-evaluated prior to inductionOxygen Delivery Method: Circle system utilized Preoxygenation: Pre-oxygenation with 100% oxygen Intubation Type: IV induction Ventilation: Mask ventilation without difficulty LMA: LMA inserted LMA Size: 4.0 Number of attempts: 1 Placement Confirmation: positive ETCO2 and breath sounds checked- equal and bilateral Tube secured with: Tape (taped across cheeks) Dental Injury: Teeth and Oropharynx as per pre-operative assessment

## 2015-12-19 NOTE — Transfer of Care (Signed)
Immediate Anesthesia Transfer of Care Note  Patient: [name] [name]  Procedure(s) Performed: Procedure(s): BIOPSY OF RIGHT SUBMANDIBULAR MASS (Right)  Patient Location: PACU  Anesthesia Type:General  Level of Consciousness: awake, oriented and patient cooperative  Airway & Oxygen Therapy: Patient Spontanous Breathing and Patient connected to nasal cannula oxygen  Post-op Assessment: Report given to RN, Post -op Vital signs reviewed and stable and Patient moving all extremities  Post vital signs: Reviewed and stable  Last Vitals:  Vitals:   12/19/15 0913 12/19/15 1255  BP: (!) 162/55 (!) 153/89  Pulse: 74 80  Resp: 20 16  Temp: 36.8 C 36.2 C    Last Pain:  Vitals:   12/19/15 1255  PainSc: 0-No pain         Complications: No apparent anesthesia complications

## 2015-12-19 NOTE — Brief Op Note (Signed)
12/19/2015  12:47 PM  PATIENT:  [name] [name]  81 y.o. female  PRE-OPERATIVE DIAGNOSIS:  right neck mass  POST-OPERATIVE DIAGNOSIS:  right neck mass  PROCEDURE:  Procedure(s): BIOPSY OF RIGHT SUBMANDIBULAR MASS (Right)  SURGEON:  Surgeon(s) and Role:    * Jerrell Belfast, MD - Primary  PHYSICIAN ASSISTANT:   ASSISTANTS: none   ANESTHESIA:   general  EBL:  No intake/output data recorded.  BLOOD ADMINISTERED:none  DRAINS: none   LOCAL MEDICATIONS USED:  LIDOCAINE  and Amount: 1 ml  SPECIMEN:  Source of Specimen:  Right Deep Neck Mass  DISPOSITION OF SPECIMEN:  PATHOLOGY  COUNTS:  YES  TOURNIQUET:  * No tourniquets in log *  DICTATION: .Other Dictation: Dictation Number [id number]  PLAN OF CARE: Discharge to home after PACU  PATIENT DISPOSITION:  PACU - hemodynamically stable.   Delay start of Pharmacological VTE agent (>24hrs) due to surgical blood loss or risk of bleeding: not applicable

## 2015-12-19 NOTE — H&P (Signed)
[name] [name] is an 81 y.o. female.   Chief Complaint: Right neck mass HPI: 3 month hx of right neck mass   Past Medical History:  Diagnosis Date  . Cataract   . History of colon polyps    all colonic mucosa  . Hyperlipidemia    on lipitor  . Osteoporosis     Past Surgical History:  Procedure Laterality Date  . COLONOSCOPY     [id number]  . EYE SURGERY Bilateral    cataract removal  . 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 POLYPECTOMY     2001,2006-all colonic mucosa     Family History  Problem Relation Age of Onset  . Colon cancer Neg Hx   . Colon polyps Neg Hx   . Esophageal cancer Neg Hx   . Rectal cancer Neg Hx   . Stomach cancer Neg Hx    Social History:  reports that she has never smoked. She has never used smokeless tobacco. She reports that she does not drink alcohol or use drugs.  Allergies:  Allergies  Allergen Reactions  . No Known Allergies     Medications Prior to Admission  Medication Sig Dispense Refill  . aspirin 81 MG tablet Chew 81 mg by mouth daily.    [name] atorvastatin (LIPITOR) 20 MG tablet Take 20 mg by mouth.    . Calcium Carbonate-Vit D-Min (CALCIUM 1200) 1200-1000 MG-UNIT CHEW Chew 1 capsule by mouth daily.    . Cholecalciferol (VITAMIN D) 2000 units CAPS Take 2,000 Units by mouth daily.    [name] denosumab (PROLIA) 60 MG/ML SOLN injection Inject 60 mg into the skin every 6 (six) months. Administer in upper arm, thigh, or abdomen    . FORTEO 600 MCG/2.4ML SOLN Inject 20 mcg into the skin daily.    . Multiple Vitamins-Minerals (MULTIVITAMIN WITH MINERALS) tablet Take 1 tablet by mouth.    . Omega-3 Fatty Acids (FISH OIL TRIPLE STRENGTH) 1400 MG CAPS Take 1 capsule by mouth daily.      No results found for this or any previous visit (from the past 48 hour(s)). No results found.  Review of Systems  Constitutional: Negative.   HENT: Negative.   Respiratory: Negative.   Cardiovascular: Negative.     Blood pressure (!)  162/55, pulse 74, temperature 98.2 F (36.8 C), resp. rate 20, height '5\' 5"'$  (1.651 m), weight 54.9 kg (121 lb), SpO2 98 %. Physical Exam  Constitutional: She is oriented to person, place, and time. She appears well-developed and well-nourished.  Neck: Normal range of motion. Neck supple.  Cardiovascular: Normal rate.   Respiratory: Effort normal.  GI: Soft.  Musculoskeletal: Normal range of motion.  Lymphadenopathy:    She has cervical adenopathy.  Neurological: She is alert and oriented to person, place, and time.     Assessment/Plan Adm for OP bx of right neck mass.  Mithra Spano, MD 12/19/2015, 11:16 AM

## 2015-12-20 NOTE — Op Note (Signed)
NAME:  [name] [name], [name] [name]                   ACCOUNT NO.:  [id number]  MEDICAL RECORD NO.:  [id number]  LOCATION:  [facility]                         FACILITY:  [facility]  PHYSICIAN:  Shanon Brow L. Wilburn Cornelia, M.D.DATE OF BIRTH:  1934/08/12  DATE OF PROCEDURE:  12/19/2015 DATE OF DISCHARGE:  12/19/2015                              OPERATIVE REPORT   LOCATION:  [facility] Main OR.  PREOPERATIVE DIAGNOSIS:  Right deep neck mass.  POSTOPERATIVE DIAGNOSIS:  Right deep neck mass.  INDICATION FOR SURGERY:  Right deep neck mass.  SURGICAL PROCEDURE:  Incisional biopsy of right deep neck mass.  ANESTHESIA:  General/LMA.  COMPLICATIONS:  No complications.  BLOOD LOSS:  Less than 25 mL.  DISPOSITION:  Patient transferred from the operating room to the recovery room in stable condition.  BRIEF HISTORY:  The patient is a 81 year old white female who referred to our office for evaluation of an asymptomatic swelling involving the right superior neck and perimandibular area.  Palpation showed a firm mass in the right upper neck adjacent to the angle of the mandible and the sternocleidomastoid muscle.  The patient was completely asymptomatic, and no recent history of infection.  A CT scan was obtained, which showed 2 concerning areas.  A several centimeter soft tissue mass adjacent to the right submandibular gland and then a large, approximately 4 cm soft tissue mass deep to the sternocleidomastoid muscle.  Findings were consistent with possible lymphoma.  Given her history and findings, I recommended biopsy under general anesthesia. The risks and benefits were discussed in detail with the patient and her husband, and they understood and agreed with our plan for surgery, which was scheduled on elective basis on December 19, 2015.  DESCRIPTION OF PROCEDURE:  The patient was brought to the operating room, placed in supine position on the operating table.  General LMA anesthesia was established without  difficulty.  When the patient adequately anesthetized, she was positioned and prepped and draped.  A surgical time-out was then performed with correct identification of the patient and the surgical procedure including the right laterality.  The patient was then positioned and injected with a total of 1 mL of 1% lidocaine with 1:100,000 dilution epinephrine, which injected in subcutaneous fashion in the skin overlying the palpable mass.  After allowing adequate time for vasoconstriction hemostasis, she was prepped, draped, and prepared for surgery.  With the patient prepared for surgery, 3 cm horizontally oriented skin incision was created in the right perimandibular area adjacent to the angle of the mandible.  This was carried through the skin underlying subcutaneous tissue with a #15 scalpel.  The subcutaneous soft tissue and fat was divided with Bovie electrocautery.  The platysma was muscle was identified and divided and subplatysmal flaps were elevated superiorly and inferiorly.  Surgical procedure was begun with dissection along the anterior submandibular space.  The normal-appearing submandibular gland was carefully isolated along its lateral and inferior border.  There was no apparent mass palpable in the submandibular space despite previous findings on the patient's CT scan. Given this, I decided not to perform a submandibular gland excision and biopsy was deferred in this region.  Dissection was then  carried posteriorly.  Sternocleidomastoid muscle was identified and elevated. There was a firm, diffuse soft tissue mass deep to the sternocleidomastoid muscle, and this was isolated from the surrounding structures  including the carotid artery and jugular vein.  The mass was carefully elevated, and a large portion measuring approximately 1 x 1 cm was removed and sent to Pathology for evaluation including lymphoma workup.  The margins of the biopsy site were then cauterized  carefully with bipolar cautery.  There was no active bleeding.  The patient's wound was thoroughly irrigated and then closed in multiple layers beginning with 4-0 Vicryl sutures.  The platysmal layer was reapproximated with interrupted 4-0 Vicryl.  The deep subcutaneous layer was closed with interrupted 5-0 Vicryl in a horizontal mattress fashion, and the final skin edge was closed with Dermabond surgical glue.  The patient was then awakened from her anesthetic.  She was extubated and transferred from the operating room to the recovery room in stable condition.  There were no complications. Estimated blood loss was approximately 25 mL.          ______________________________ Early Chars. Wilburn Cornelia, M.D.     DLS/MEDQ  D:  77/06/4033  T:  12/20/2015  Job:  [id number]

## 2015-12-21 ENCOUNTER — Encounter (HOSPITAL_COMMUNITY): Payer: Self-pay | Admitting: Otolaryngology

## 2015-12-30 ENCOUNTER — Encounter: Payer: Self-pay | Admitting: Hematology

## 2015-12-30 ENCOUNTER — Telehealth: Payer: Self-pay | Admitting: Hematology

## 2015-12-30 NOTE — Telephone Encounter (Signed)
Pt confirmed appt., completed intake, faxed referring provider date/time

## 2016-01-01 ENCOUNTER — Ambulatory Visit (HOSPITAL_BASED_OUTPATIENT_CLINIC_OR_DEPARTMENT_OTHER): Payer: Medicare Other | Admitting: Hematology

## 2016-01-01 ENCOUNTER — Encounter: Payer: Self-pay | Admitting: Hematology

## 2016-01-01 ENCOUNTER — Telehealth: Payer: Self-pay | Admitting: Hematology

## 2016-01-01 VITALS — BP 157/64 | HR 75 | Temp 98.1°F | Resp 17 | Wt 120.4 lb

## 2016-01-01 DIAGNOSIS — C8201 Follicular lymphoma grade I, lymph nodes of head, face, and neck: Secondary | ICD-10-CM

## 2016-01-01 DIAGNOSIS — M810 Age-related osteoporosis without current pathological fracture: Secondary | ICD-10-CM

## 2016-01-01 NOTE — Progress Notes (Signed)
[name]    HEMATOLOGY/ONCOLOGY CONSULTATION NOTE  Date of Service: 01/01/2016  Patient Care Team: Deland Pretty, MD as PCP - General (Internal Medicine)  CHIEF COMPLAINTS/PURPOSE OF CONSULTATION:  Newly diagnosed follicular lymphoma  HISTORY OF PRESENTING ILLNESS:   [name] [name] is a wonderful 81 y.o. female who has been referred to [place] by Dr .Horatio Pel, MD for evaluation and management of newly diagnosed follicular lymphoma.  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 Laverta Baltimore who is a retired Publishing rights manager from [place], [place].  She presented to her primary care physician with 3 months of asymptomatic fullness/swelling in her right upper neck with no overt symptoms of dental pain or swelling, fevers, chills or night sweats.  The swelling was painless and gradually growing.  She was referred to Dr. Jerrell Belfast (ENT) for further evaluation and had a CT of the neck on 12/08/2015 which showed findings "soft tissue mass measuring up to 3.2 cm which most resembles an abnormal right level 1 B lymph node. This medially displaces but seems to be separate from the adjacent right submandibular gland.There is a larger spiculated and somewhat infiltrative appearing right lateral neck mass centered at the level 2 nodal station measuring up to 4.7 cm. This lesion is inseparable from the undersurface of the right sternocleidomastoid muscle, but does not appear to invade the subjacent right carotid space.  Associated small but asymmetrically enlarged and conspicuous right level 3 lymph nodes. No primary pharyngeal or laryngeal tumor identified."  The patient subsequently had incisional biopsy of the right upper neck lymph node on 12/19/2015 which is consistent with a low-grade follicular lymphoma. Patient has been referred for  evaluation and management for follicular lymphoma. She notes no other areas of obviously enlarged lymph nodes. No constitutional symptoms such as fevers, chills, night sweats, unexpected weight loss. Overall feels well and no differently than she felt 6 months ago.  MEDICAL HISTORY:  Past Medical History:  Diagnosis Date  . Cataract   . History of colon polyps    all colonic mucosa  . Hyperlipidemia    on lipitor  . 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 COLONOSCOPY     [id number]  . EYE SURGERY Bilateral    cataract removal  . 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 POLYPECTOMY     2001,2006-all colonic mucosa   . SUBMANDIBULAR GLAND EXCISION Right 12/19/2015   Procedure: BIOPSY OF RIGHT SUBMANDIBULAR MASS;  Surgeon: Jerrell Belfast, MD;  Location: [facility] OR;  Service: ENT;  Laterality: Right;    SOCIAL HISTORY: Social History   Social History  . Marital status: Married    Spouse name: N/A  . Number of children: N/A  . Years of education: N/A   Occupational History  . Not on file.   Social History Main Topics  . Smoking status: Never Smoker  . Smokeless tobacco: Never Used  . Alcohol use No  . Drug use: No  . Sexual activity: Not on file   Other Topics Concern  . Not on file   Social History Narrative  . No narrative on file    FAMILY HISTORY: Family History  Problem Relation  Age of Onset  . Colon cancer Neg Hx   . Colon polyps Neg Hx   . Esophageal cancer Neg Hx   . Rectal cancer Neg Hx   . Stomach cancer Neg Hx     ALLERGIES:  is allergic to no known allergies.  MEDICATIONS:  Current Outpatient Prescriptions  Medication Sig Dispense Refill  . aspirin 81 MG tablet Chew 81 mg by mouth daily.    [name] atorvastatin (LIPITOR) 20 MG tablet Take 20 mg by mouth.    . Calcium  Carb-Cholecalciferol (CALCIUM 500 +D) 500-400 MG-UNIT TABS Take by mouth.    . Cholecalciferol (VITAMIN D) 2000 units CAPS Take 2,000 Units by mouth daily.    [name] denosumab (PROLIA) 60 MG/ML SOLN injection Inject 60 mg into the skin every 6 (six) months. Administer in upper arm, thigh, or abdomen    . FORTEO 600 MCG/2.4ML SOLN Inject 20 mcg into the skin daily.    . Multiple Vitamins-Minerals (MULTIVITAMIN WITH MINERALS) tablet Take 1 tablet by mouth.    . Omega-3 Fatty Acids (FISH OIL TRIPLE STRENGTH) 1400 MG CAPS Take 1 capsule by mouth daily.     No current facility-administered medications for this visit.     REVIEW OF SYSTEMS:    10 Point review of Systems was done is negative except as noted above.  PHYSICAL EXAMINATION: ECOG PERFORMANCE STATUS: 1 - Symptomatic but completely ambulatory  . Vitals:   01/01/16 1130  BP: (!) 157/64  Pulse: 75  Resp: 17  Temp: 98.1 F (36.7 C)   Filed Weights   01/01/16 1130  Weight: 120 lb 6.4 oz (54.6 kg)   .Body mass index is 20.04 kg/m.  GENERAL:alert, in no acute distress and comfortable SKIN: skin color, texture, turgor are normal, no rashes or significant lesions EYES: normal, conjunctiva are pink and non-injected, sclera clear OROPHARYNX:no exudate, no erythema and lips, buccal mucosa, and tongue normal  NECK: supple, no JVD, thyroid normal size, non-tender, without nodularity LYMPH:  no palpable lymphadenopathy in the cervical, axillary or inguinal LUNGS: clear to auscultation with normal respiratory effort HEART: regular rate & rhythm,  no murmurs and no lower extremity edema ABDOMEN: abdomen soft, non-tender, normoactive bowel sounds , no palpable hepatosplenomegaly Musculoskeletal: no cyanosis of digits and no clubbing  PSYCH: alert & oriented x 3 with fluent speech NEURO: no focal motor/sensory deficits  LABORATORY DATA:  I have reviewed the data as listed  . CBC Latest Ref Rng & Units 12/17/2015  WBC 4.0 - 10.5 K/uL 8.2   Hemoglobin 12.0 - 15.0 g/dL 14.9  Hematocrit 36.0 - 46.0 % 46.2(H)  Platelets 150 - 400 K/uL 257    . CMP Latest Ref Rng & Units 12/17/2015  Glucose 65 - 99 mg/dL 94  BUN 6 - 20 mg/dL 11  Creatinine 0.44 - 1.00 mg/dL 0.74  Sodium 135 - 145 mmol/L 141  Potassium 3.5 - 5.1 mmol/L 4.3  Chloride 101 - 111 mmol/L 105  CO2 22 - 32 mmol/L 28  Calcium 8.9 - 10.3 mg/dL 10.2       RADIOGRAPHIC STUDIES: I have personally reviewed the radiological images as listed and agreed with the findings in the report. Ct Soft Tissue Neck W Contrast  Result Date: 12/08/2015 CLINICAL DATA:  81 year old female 81 year old female with palpable abnormality under the right jaw discovered 3 months ago. Neck mass. Right lung cancer status post right middle lobectomy in 2008. EXAM: CT NECK WITH CONTRAST TECHNIQUE: Multidetector CT imaging of the neck was performed  using the standard protocol following the bolus administration of intravenous contrast. CONTRAST:  75 mL Isovue-300 COMPARISON:  Cervical spine radiographs 02/23/2012. Chest CT 08/20/2009. FINDINGS: Pharynx and larynx: The larynx appears within normal limits, there is slight asymmetry of the right laryngeal ventricle. Pharyngeal soft tissue contours also appear within normal limits. There is a retropharyngeal course of the right ICA which results in mild asymmetry on the right side. The remainder of the retropharyngeal space, and the bilateral parapharyngeal spaces appear within normal limits. Salivary glands: Mass effect on the right submandibular gland related to right submandibular space soft tissue mass described in the lymph nodes section below. Negative sublingual space. Left submandibular gland and both parotid glands appear normal. Thyroid: Negative. Lymph nodes: The palpable area of concern marked just below the right mandible corresponds to an oval soft tissue mass which is adjacent to but seems to be separate from the right submandibular gland which is  being displaced medially (series 3, image 26). The palpable mass measures 32 x 18 x 28 mm (AP by transverse by CC). A branch of the nearby right external carotid artery courses into this mass as seen on sagittal image 21. In addition there is a large fungating right lateral neck mass which is situated between the right sternocleidomastoid muscle, the inferior portion of the right parotid gland, an the right carotid space. This large mass encompasses 47 x 20 x 47 mm (AP by transverse by CC). See sagittal image 19. It has irregular margins. Both this an the right submandibular space mass have similar density which is also nearly identical the skeletal muscle. Subsequently, this larger mass is inseparable from the right sternocleidomastoid muscle. Several much smaller but asymmetrically enlarged and conspicuous right level 3 nodes are then identified below the dominant mass as seen on sagittal image 19. The larger is 6-7 mm short axis. No cystic or necrotic nodes. Left side cervical lymph nodes are all diminutive. Vascular: Major vascular structures in the neck and at the skullbase remain patent. The dominant mass abuts along segment of the right carotid space, but the adjacent right internal jugular vein does not appear invaded or occluded. Mild Calcified atherosclerosis at the skull base. Limited intracranial: Negative. Visualized orbits: Negative. Mastoids and visualized paranasal sinuses: Clear. Skeleton: The right submandibular space soft tissue mass abuts the mandible but the mandible remains normal. Chronic cervical spine degeneration. No acute or suspicious osseous lesion identified. Upper chest: Negative visualized lung apices. No superior mediastinal lymphadenopathy. No axillary lymphadenopathy. IMPRESSION: 1. Palpable abnormality corresponds to a soft tissue mass measuring up to 3.2 cm which most resembles an abnormal right level 1 B lymph node. This medially displaces but seems tube be separate from the  adjacent right submandibular gland. 2. However, there is a larger spiculated and somewhat infiltrative appearing right lateral neck mass centered at the level 2 nodal station measuring up to 4.7 cm. This lesion is inseparable from the undersurface of the right sternocleidomastoid muscle, but does not appear to invade the subjacent right carotid space. 3. Associated small but asymmetrically enlarged and conspicuous right level 3 lymph nodes. No primary pharyngeal or laryngeal tumor identified. 4. 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 Differential considerations include metastatic nodal disease from an unseen head and neck primary, or from a regional skin cancer or other unknown malignancy, and less likely sarcoma. Electronically Signed   By: Genevie Ann M.D.   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 Plan -we spent more than 30 min explaining her diagnosis, usual natural history of the disease and prognostic factors, recommended workup and potential approach to treatment based on stage and grade of the tumor. -Patient and her husband who is a retired Psychologist, sport and exercise had several questions which were answered in details.  We went over the relevant NCCN guidelines for workup and management of follicular lymphomas. -The next step is to get a PET/CT scan for initial staging of her newly diagnosed follicular lymphoma. -If her disease is limited to the right upper neck/ stage I follicular lymphoma would likely refer her to radiation oncology to consider ISRT which could potentially lead to Schalk-term complete remission for limited stage disease. -for higher stages of lymphoma and we shall discuss treatment accordingly. -we shall need to get initial labs including CBC, CMP, LDH, beta-2 microglobulin which have been ordered. -She will need hepatitis B  serologies if chemotherapy/Rituxan is contemplated.  Return to care with Dr. Irene Limbo after PET/CT scan and labs within the next 7-10 days.  It is a privilege to take care of this wonderful person and I appreciate this interesting consultation.  All of the patients and her husbands questions were answered to their apparent satisfaction. The patient knows to call the clinic with any problems, questions or concerns.  I spent 60 minutes counseling the patient face to face. The total time spent in the appointment was 70 minutes and more than 50% was on counseling and direct patient cares.    Sullivan Lone MD [place] AAHIVMS [facility] Hematology/Oncology Physician [facility]  (Office):       [phone] (Work cell):  [phone] (Fax):           [phone]  01/01/2016 12:41 PM

## 2016-01-01 NOTE — Telephone Encounter (Signed)
GAVE PATIENT AVS REPORT AND APPOINTMENTS FOR September.  °

## 2016-01-09 ENCOUNTER — Ambulatory Visit (HOSPITAL_COMMUNITY)
Admission: RE | Admit: 2016-01-09 | Discharge: 2016-01-09 | Disposition: A | Discharge status: Home / Self Care | Payer: Medicare Other | Source: Ambulatory Visit | Attending: Hematology | Admitting: Hematology

## 2016-01-09 DIAGNOSIS — C8201 Follicular lymphoma grade I, lymph nodes of head, face, and neck: Secondary | ICD-10-CM | POA: Insufficient documentation

## 2016-01-09 LAB — GLUCOSE, CAPILLARY: GLUCOSE-CAPILLARY: 86 mg/dL (ref 65–99)

## 2016-01-09 IMAGING — PT NM PET TUM IMG INITIAL (PI) SKULL BASE T - THIGH
8 of 9 series · 19 of 25 positions shown · non-contrast
Comparison: PET-CT 04/05/2016, CT neck 12/08/2015

CLINICAL DATA: Initial treatment strategy for lymphoma. Grade 1
follicular lymphoma of lymph nodes in the neck..

EXAM:
NUCLEAR MEDICINE PET SKULL BASE TO THIGH
TECHNIQUE: Six mCi F-18 FDG was injected intravenously. Full-ring PET imaging
was performed from the skull base to thigh after the radiotracer. CT
data was obtained and used for attenuation correction and anatomic
localization.
FASTING BLOOD GLUCOSE:  Value: 86 mg/dl

[Series 3: pet sk_thigh ac · axial · 5.0mm · 4.07mm/px · z∈[-1096,-336]mm · 3 of 191 slices shown]
[im 1/191]
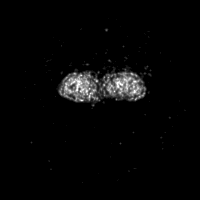
[im 64/191]
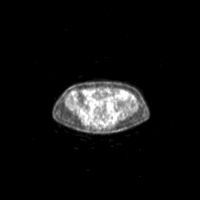
[im 191/191]
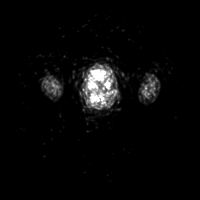

[Series 4: ct sk_thigh 5.0 b31f · axial · 5.0mm · 0.88mm/px · z∈[-1096,-336]mm · 4 of 191 slices shown]
[im 1/191]
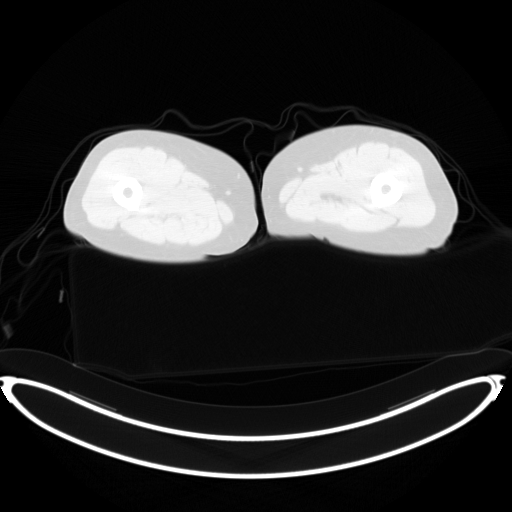
[im 48/191]
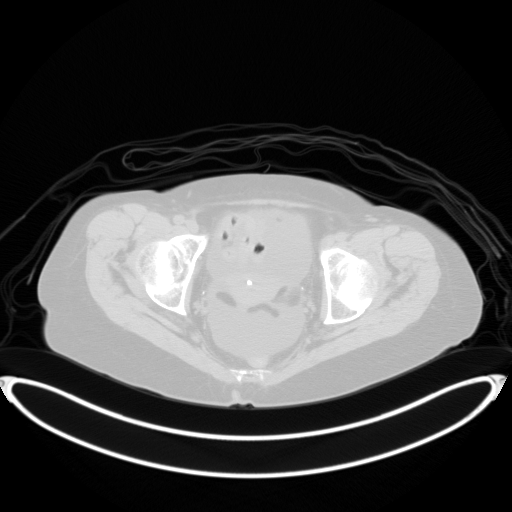
[im 143/191]
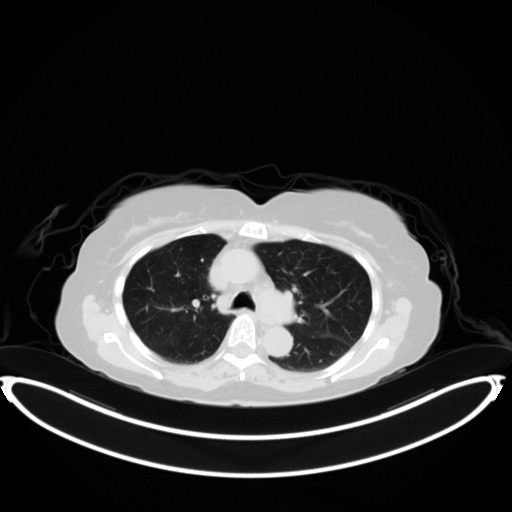
[im 191/191]
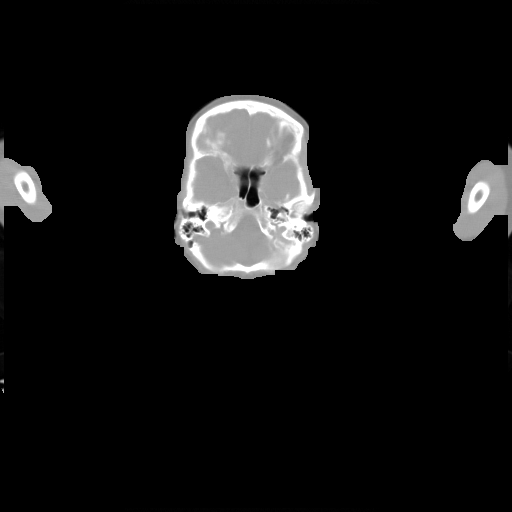

[Series 7: pet sk_thigh nac · axial · 5.0mm · 4.07mm/px · z∈[-1096,-336]mm · 4 of 191 slices shown]
[im 1/191]
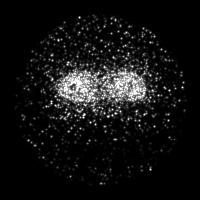
[im 96/191]
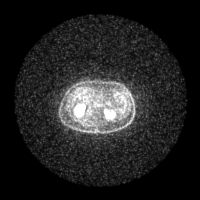
[im 143/191]
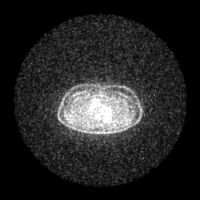
[im 191/191]
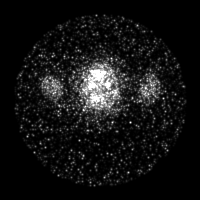

[Series 603: range-ct sk_thigh 5.0 (id)<alpha range> · 2 of 68 slices shown (1 of 2)]
[im 1/68]
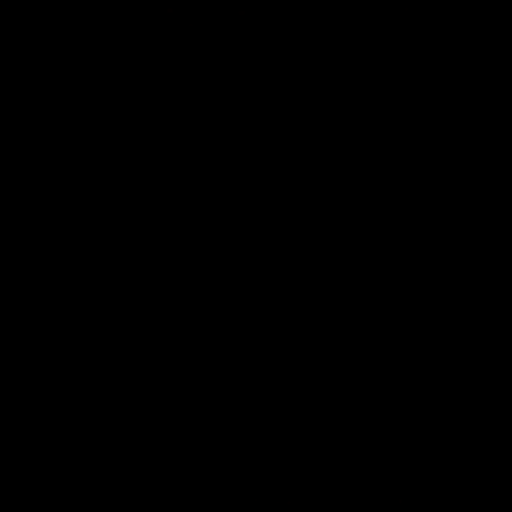
[im 68/68]
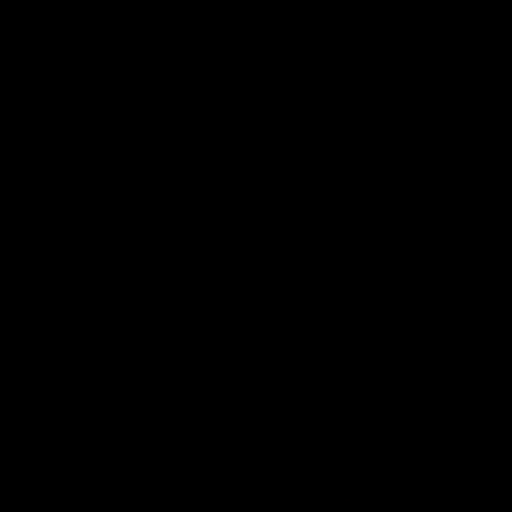

[Series 604: mip collection · coronal · 1.68mm/px · 1 of 32 slices shown]
[im 1/32]
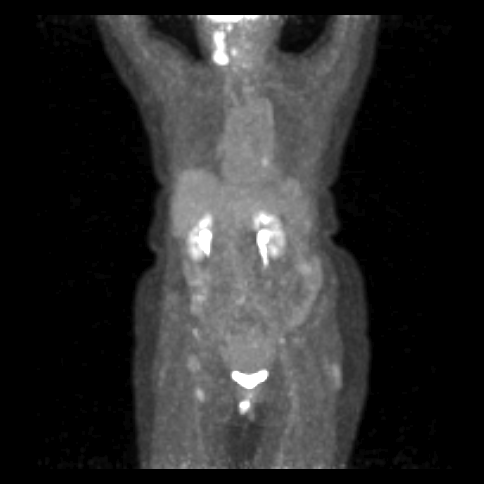

[Series 605: range-ct sk_thigh 5.0 (id)<alpha range> · 3 of 186 slices shown (2 of 2)]
[im 47/186]
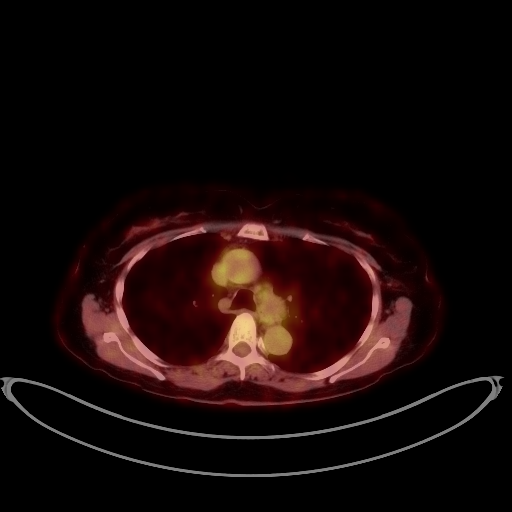
[im 93/186]
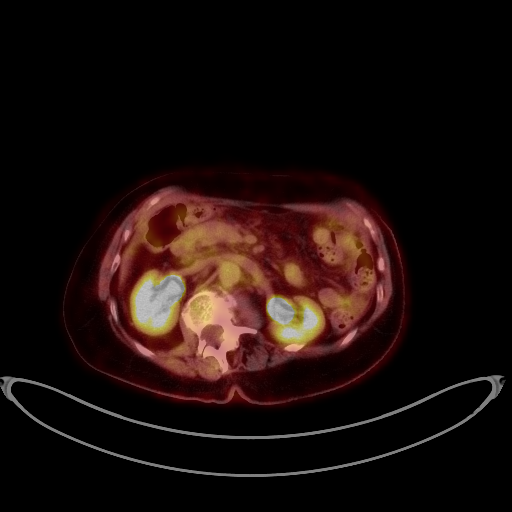
[im 139/186]
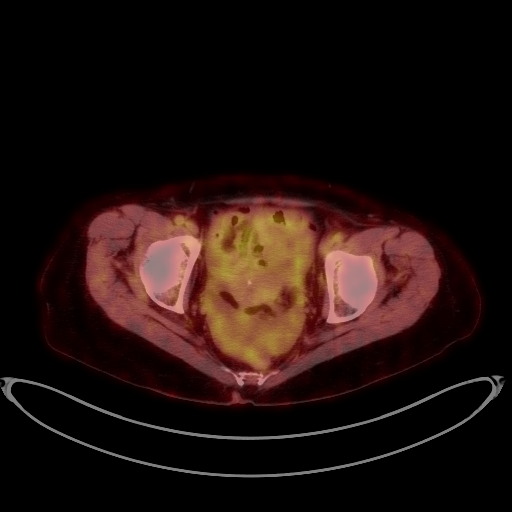

[Series 1032: results mm oncology reading · 0.92mm/px · 1 of 5 slices shown (1 of 2)]
[im 1/5]
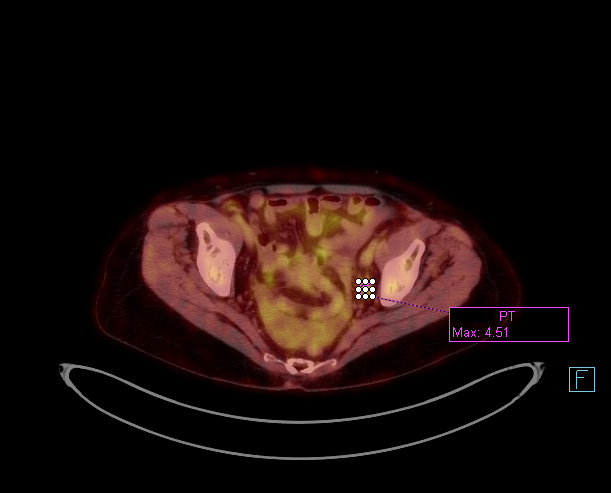

[Series 1065: results mm oncology reading · 0.44mm/px · 1 of 1 slices shown (2 of 2)]
[im 1/1]
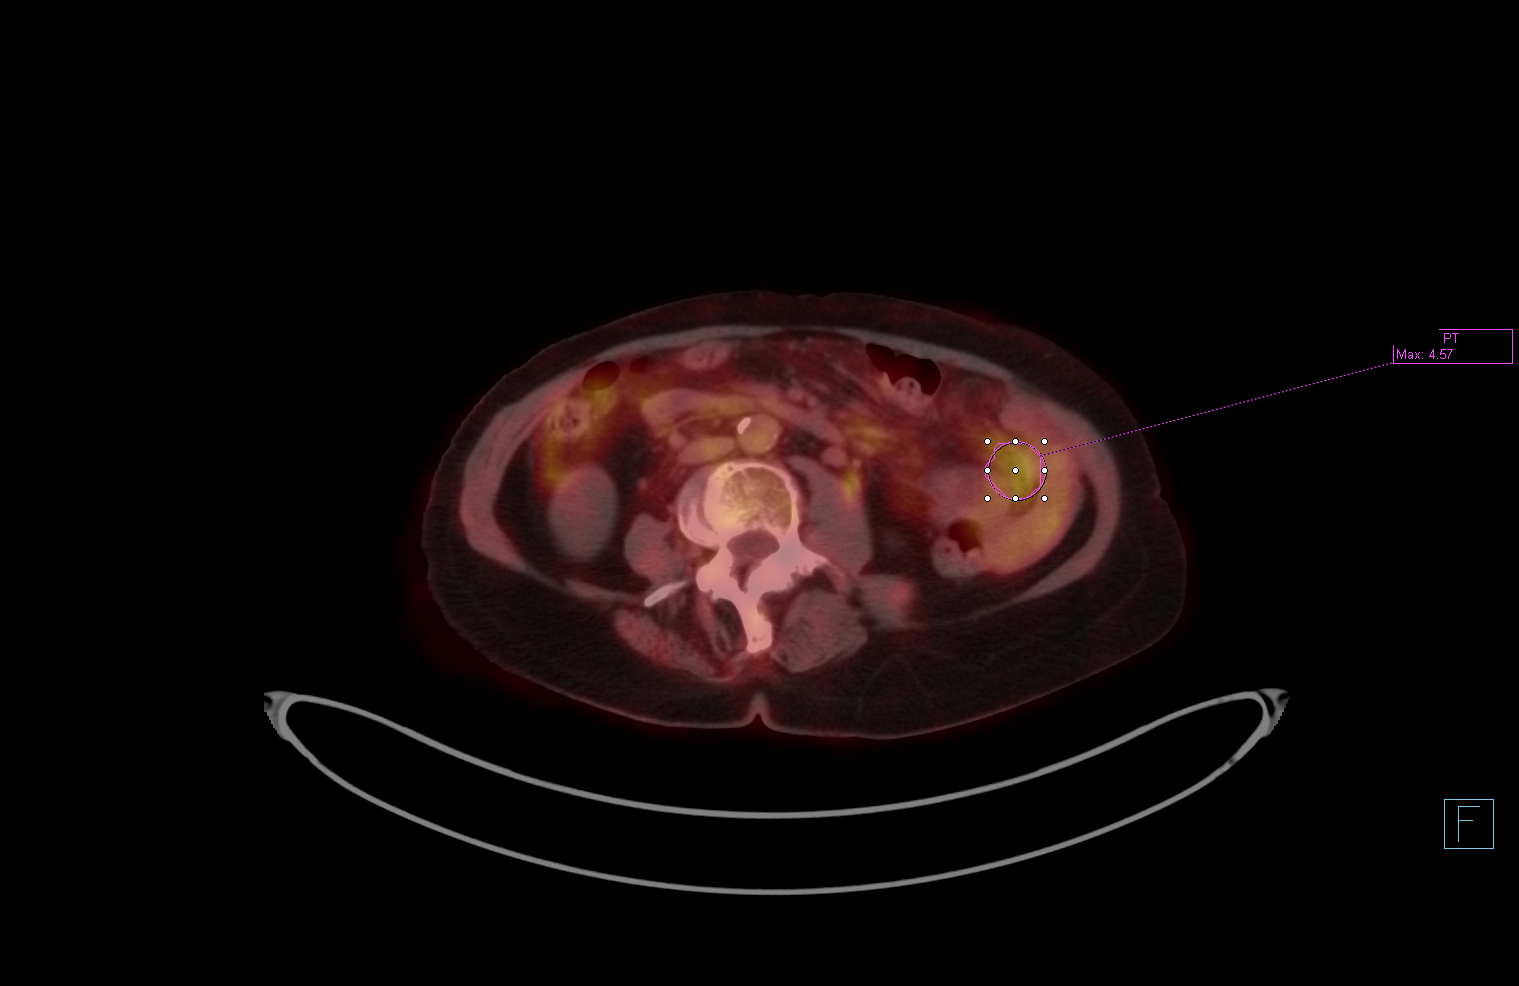

[19 of 25 positions shown; findings below may reference images not displayed]

FINDINGS: NECK

Two large hypermetabolic RIGHT level II lymph nodes. More superior
measures 21 mm short axis beneath the sternocleidomastoid muscle
with SUV max equal 13.9. More inferior node is anterior to the
sternocleidomastoid muscle measuring 27 mm short axis with SUV max
equal 15.8.

There is asymmetric hypermetabolic activity within the pharyngeal
mucosa localizing to the RIGHT lingual tonsil region.

CHEST

Single hypermetabolic mediastinal lymph node anterior to the
descending thoracic aorta adjacent to the esophagus measures 7 mm
(image 63, series 4) with SUV max equal 5.7.

No additional hypermetabolic mediastinal hilar lymph nodes. No
hypermetabolic mediastinal or hilar nodes. No suspicious pulmonary
nodules on the CT scan.

ABDOMEN/PELVIS

Several minimally enlarged hypermetabolic pelvic lymph nodes are
present. The most intense is a RIGHT inguinal lymph node measuring
13 mm with SUV max equal 5.6. Small LEFT obturator node measures
only 6 mm (image 138, series 4) but intense for size with SUV max
equal 4.5. Several similar RIGHT external iliac nodes.

No hypermetabolic upper abdominal adenopathy. The spleen is normal
size and metabolic activity. Liver normal. Kidneys appear normal.

SKELETON

No hypermetabolic marrow activity.
IMPRESSION: 1. Bulky hypermetabolic RIGHT level 2 neck lymph nodes.
2. Hypermetabolic activity at the level of the RIGHT lingual tonsil
without clear mass.
3. Single hypermetabolic periaortic lymph node in the thorax.
4. Small hypermetabolic pelvic and RIGHT inguinal lymph nodes are
concerning for lymphoma involvement of pelvic lymph nodes.
5. Normal spleen.
6. No abnormal marrow activity to suggest lymphoma bone involvement
.

## 2016-01-09 MED ORDER — FLUDEOXYGLUCOSE F - 18 (FDG) INJECTION
5.9900 | Freq: Once | INTRAVENOUS | Status: AC | PRN
  Administered 2016-01-09: 5.99 via INTRAVENOUS

## 2016-01-13 ENCOUNTER — Encounter: Payer: Self-pay | Admitting: Hematology

## 2016-01-13 ENCOUNTER — Ambulatory Visit (HOSPITAL_BASED_OUTPATIENT_CLINIC_OR_DEPARTMENT_OTHER): Payer: Medicare Other | Admitting: Hematology

## 2016-01-13 ENCOUNTER — Other Ambulatory Visit (HOSPITAL_BASED_OUTPATIENT_CLINIC_OR_DEPARTMENT_OTHER): Payer: Medicare Other

## 2016-01-13 ENCOUNTER — Telehealth: Payer: Self-pay | Admitting: Hematology

## 2016-01-13 ENCOUNTER — Other Ambulatory Visit: Payer: Self-pay | Admitting: *Deleted

## 2016-01-13 VITALS — BP 140/60 | HR 82 | Temp 98.0°F | Resp 16

## 2016-01-13 DIAGNOSIS — Z23 Encounter for immunization: Secondary | ICD-10-CM

## 2016-01-13 DIAGNOSIS — C8201 Follicular lymphoma grade I, lymph nodes of head, face, and neck: Secondary | ICD-10-CM

## 2016-01-13 LAB — CBC & DIFF AND RETIC
BASO%: 0.5 % (ref 0.0–2.0)
BASOS ABS: 0.1 10*3/uL (ref 0.0–0.1)
EOS ABS: 0.1 10*3/uL (ref 0.0–0.5)
EOS%: 1.2 % (ref 0.0–7.0)
HEMATOCRIT: 44.5 % (ref 34.8–46.6)
HEMOGLOBIN: 15.1 g/dL (ref 11.6–15.9)
IMMATURE RETIC FRACT: 1.5 % — AB (ref 1.60–10.00)
LYMPH#: 2 10*3/uL (ref 0.9–3.3)
LYMPH%: 20.3 % (ref 14.0–49.7)
MCH: 30.4 pg (ref 25.1–34.0)
MCHC: 33.9 g/dL (ref 31.5–36.0)
MCV: 89.7 fL (ref 79.5–101.0)
MONO#: 0.7 10*3/uL (ref 0.1–0.9)
MONO%: 6.9 % (ref 0.0–14.0)
NEUT#: 7 10*3/uL — ABNORMAL HIGH (ref 1.5–6.5)
NEUT%: 71.1 % (ref 38.4–76.8)
Platelets: 265 10*3/uL (ref 145–400)
RBC: 4.96 10*6/uL (ref 3.70–5.45)
RDW: 14.2 % (ref 11.2–14.5)
RETIC %: 1.45 % (ref 0.70–2.10)
RETIC CT ABS: 71.92 10*3/uL (ref 33.70–90.70)
WBC: 9.9 10*3/uL (ref 3.9–10.3)

## 2016-01-13 LAB — COMPREHENSIVE METABOLIC PANEL
ALBUMIN: 4.1 g/dL (ref 3.5–5.0)
ALK PHOS: 66 U/L (ref 40–150)
ALT: 19 U/L (ref 0–55)
AST: 23 U/L (ref 5–34)
Anion Gap: 12 mEq/L — ABNORMAL HIGH (ref 3–11)
BUN: 13.2 mg/dL (ref 7.0–26.0)
CALCIUM: 11.5 mg/dL — AB (ref 8.4–10.4)
CHLORIDE: 105 meq/L (ref 98–109)
CO2: 24 mEq/L (ref 22–29)
CREATININE: 0.8 mg/dL (ref 0.6–1.1)
EGFR: 69 mL/min/{1.73_m2} — ABNORMAL LOW (ref >90)
Glucose: 125 mg/dl (ref 70–140)
Potassium: 3.7 mEq/L (ref 3.5–5.1)
Sodium: 140 mEq/L (ref 136–145)
Total Bilirubin: 0.51 mg/dL (ref 0.20–1.20)
Total Protein: 7.6 g/dL (ref 6.4–8.3)

## 2016-01-13 LAB — LACTATE DEHYDROGENASE: LDH: 176 U/L (ref 125–245)

## 2016-01-13 MED ORDER — PREDNISONE 20 MG PO TABS: ORAL_TABLET | ORAL | 0 refills | Status: DC

## 2016-01-13 MED ORDER — INFLUENZA VAC SPLIT QUAD 0.5 ML IM SUSY
0.5000 mL | PREFILLED_SYRINGE | Freq: Once | INTRAMUSCULAR | Status: AC
  Administered 2016-01-13: 0.5 mL via INTRAMUSCULAR
  Filled 2016-01-13: qty 0.5

## 2016-01-13 NOTE — Telephone Encounter (Signed)
Gave patient avs report and appointment for November and 10/2 for radonc - spoke with lori.

## 2016-01-14 LAB — BETA 2 MICROGLOBULIN, SERUM: Beta-2: 2 mg/L (ref 0.6–2.4)

## 2016-01-16 NOTE — Progress Notes (Signed)
[name] [name] Kitchen    HEMATOLOGY/ONCOLOGY CLINIC NOTE  Date of Service: .01/13/2016  Patient Care Team: [name] Pretty, MD as PCP - General (Internal Medicine)  CHIEF COMPLAINTS/PURPOSE OF CONSULTATION:  Newly diagnosed follicular lymphoma  HISTORY OF PRESENTING ILLNESS:   [name] [name] is a wonderful 81 y.o. female who has been referred to [place] by Dr .[name] Pel, MD for evaluation and management of newly diagnosed follicular lymphoma.  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 who is a retired Publishing rights manager from [place], [place].  She presented to her primary care physician with 3 months of asymptomatic fullness/swelling in her right upper neck with no overt symptoms of dental pain or swelling, fevers, chills or night sweats.  The swelling was painless and gradually growing.  She was referred to Dr. Jerrell [name] (ENT) for further evaluation and had a CT of the neck on 12/08/2015 which showed findings "soft tissue mass measuring up to 3.2 cm which most resembles an abnormal right level 1 B lymph node. This medially displaces but seems to be separate from the adjacent right submandibular gland.There is a larger spiculated and somewhat infiltrative appearing right lateral neck mass centered at the level 2 nodal station measuring up to 4.7 cm. This lesion is inseparable from the undersurface of the right sternocleidomastoid muscle, but does not appear to invade the subjacent right carotid space.  Associated small but asymmetrically enlarged and conspicuous right level 3 lymph nodes. No primary pharyngeal or laryngeal tumor identified."  The patient subsequently had incisional biopsy of the right upper neck lymph node on 12/19/2015 which is consistent with a low-grade follicular lymphoma. Patient has been referred for  evaluation and management for follicular lymphoma. She notes no other areas of obviously enlarged lymph nodes. No constitutional symptoms such as fevers, chills, night sweats, unexpected weight loss. Overall feels well and no differently than she felt 6 months ago.  INTERVAL HISTORY  [name] is here for follow-up after her PET/CT scan and labs to discuss the results of her PET/CT and define treatment options. She notes no acute new symptoms. A right neck mass is still somewhat bothersome but hasn't changed significantly since her last visit. No other new focal symptoms. No constitutional symptoms  MEDICAL HISTORY:  Past Medical History:  Diagnosis Date  . Cataract   . History of colon polyps    all colonic mucosa  . Hyperlipidemia    on lipitor  . 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 COLONOSCOPY     [id number]  . EYE SURGERY Bilateral    cataract removal  . 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 POLYPECTOMY     2001,2006-all colonic mucosa   . SUBMANDIBULAR GLAND EXCISION Right 12/19/2015   Procedure: BIOPSY OF RIGHT SUBMANDIBULAR MASS;  Surgeon: [name] Belfast, MD;  Location: [facility] OR;  Service: ENT;  Laterality: Right;    SOCIAL HISTORY: Social History   Social History  . Marital status: Married    Spouse name: N/A  . Number of children: N/A  . Years of education: N/A   Occupational History  . Not on file.   Social History Main Topics  . Smoking  status: Never Smoker  . Smokeless tobacco: Never Used  . Alcohol use No  . Drug use: No  . Sexual activity: Not on file   Other Topics Concern  . Not on file   Social History Narrative  . No narrative on file    FAMILY HISTORY: Family History  Problem Relation Age of Onset  . Colon cancer Neg Hx   . Colon polyps Neg Hx   .  Esophageal cancer Neg Hx   . Rectal cancer Neg Hx   . Stomach cancer Neg Hx     ALLERGIES:  is allergic to no known allergies.  MEDICATIONS:  Current Outpatient Prescriptions  Medication Sig Dispense Refill  . aspirin 81 MG tablet Chew 81 mg by mouth daily.    [name] [name] Kitchen atorvastatin (LIPITOR) 20 MG tablet Take 20 mg by mouth.    . Calcium Carb-Cholecalciferol (CALCIUM 500 +D) 500-400 MG-UNIT TABS Take by mouth.    . Cholecalciferol (VITAMIN D) 2000 units CAPS Take 2,000 Units by mouth daily.    [name] [name] Kitchen denosumab (PROLIA) 60 MG/ML SOLN injection Inject 60 mg into the skin every 6 (six) months. Administer in upper arm, thigh, or abdomen    . FORTEO 600 MCG/2.4ML SOLN Inject 20 mcg into the skin daily.    . Multiple Vitamins-Minerals (MULTIVITAMIN WITH MINERALS) tablet Take 1 tablet by mouth.    . Omega-3 Fatty Acids (FISH OIL TRIPLE STRENGTH) 1400 MG CAPS Take 1 capsule by mouth daily.    . predniSONE (DELTASONE) 20 MG tablet 41m po daily for 5 days then 462mpo daily x 3 days then 2064maily x 3 days. Take in the morning with breakfast 30 tablet 0   No current facility-administered medications for this visit.     REVIEW OF SYSTEMS:    10 Point review of Systems was done is negative except as noted above.  PHYSICAL EXAMINATION: ECOG PERFORMANCE STATUS: 1 - Symptomatic but completely ambulatory  . Vitals:   01/13/16 1325  BP: 140/60  Pulse: 82  Resp: 16  Temp: 98 F (36.7 C)   There were no vitals filed for this visit. .There is no height or weight on file to calculate BMI.  GENERAL:alert, in no acute distress and comfortable SKIN: skin color, texture, turgor are normal, no rashes or significant lesions EYES: normal, conjunctiva are pink and non-injected, sclera clear OROPHARYNX:no exudate, no erythema and lips, buccal mucosa, and tongue normal  NECK: supple, no JVD, thyroid normal size, non-tender, without nodularity LYMPH:  Right upper neck palpable lymphadenopathy measuring about  4-5 cm in size unchanged from her last visit , no palpable axillary adenopathy .small palpable right inguinal adenopathy . LUNGS: clear to auscultation with normal respiratory effort HEART: regular rate & rhythm,  no murmurs and no lower extremity edema ABDOMEN: abdomen soft, non-tender, normoactive bowel sounds , no palpable hepatosplenomegaly Musculoskeletal: no cyanosis of digits and no clubbing  PSYCH: alert & oriented x 3 with fluent speech NEURO: no focal motor/sensory deficits  LABORATORY DATA:  I have reviewed the data as listed  . CBC Latest Ref Rng & Units 01/13/2016 12/17/2015  WBC 3.9 - 10.3 10e3/uL 9.9 8.2  Hemoglobin 11.6 - 15.9 g/dL 15.1 14.9  Hematocrit 34.8 - 46.6 % 44.5 46.2(H)  Platelets 145 - 400 10e3/uL 265 257    . CMP Latest Ref Rng & Units 01/13/2016 12/17/2015  Glucose 70 - 140 mg/dl 125 94  BUN 7.0 - 26.0 mg/dL 13.2 11  Creatinine 0.6 - 1.1 mg/dL 0.8  0.74  Sodium 136 - 145 mEq/L 140 141  Potassium 3.5 - 5.1 mEq/L 3.7 4.3  Chloride 101 - 111 mmol/L - 105  CO2 22 - 29 mEq/L 24 28  Calcium 8.4 - 10.4 mg/dL 11.5(H) 10.2  Total Protein 6.4 - 8.3 g/dL 7.6 -  Total Bilirubin 0.20 - 1.20 mg/dL 0.51 -  Alkaline Phos 40 - 150 U/L 66 -  AST 5 - 34 U/L 23 -  ALT 0 - 55 U/L 19 -   . Lab Results  Component Value Date   LDH 176 01/13/2016        RADIOGRAPHIC STUDIES: I have personally reviewed the radiological images as listed and agreed with the findings in the report. Nm Pet Image Initial (pi) Skull Base To Thigh  Result Date: 01/09/2016 CLINICAL DATA:  Initial treatment strategy for lymphoma. 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 EXAM: NUCLEAR MEDICINE PET SKULL BASE TO THIGH TECHNIQUE: Six mCi F-18 FDG was injected intravenously. Full-ring PET imaging was performed from the skull base to thigh after the radiotracer. CT data was obtained and used for attenuation correction and anatomic localization. FASTING BLOOD GLUCOSE:  Value: 86 mg/dl  COMPARISON:  PET-CT 04/05/2016, CT neck 12/08/2015 FINDINGS: NECK Two large hypermetabolic RIGHT level II lymph nodes. More superior measures 21 mm short axis beneath the sternocleidomastoid muscle with SUV max equal 13.9. More inferior node is anterior to the sternocleidomastoid muscle measuring 27 mm short axis with SUV max equal 15.8. There is asymmetric hypermetabolic activity within the pharyngeal mucosa localizing to the RIGHT lingual tonsil region. CHEST Single hypermetabolic mediastinal lymph node anterior to the descending thoracic aorta adjacent to the esophagus measures 7 mm (image 63, series 4) with SUV max equal 5.7. No additional hypermetabolic mediastinal hilar lymph nodes. No hypermetabolic mediastinal or hilar nodes. No suspicious pulmonary nodules on the CT scan. ABDOMEN/PELVIS Several minimally enlarged hypermetabolic pelvic lymph nodes are present. The most intense is a RIGHT inguinal lymph node measuring 13 mm with SUV max equal 5.6. Small LEFT obturator node measures only 6 mm (image 138, series 4) but intense for size with SUV max equal 4.5. Several similar RIGHT external iliac nodes. No hypermetabolic upper abdominal adenopathy. The spleen is normal size and metabolic activity. Liver normal. Kidneys appear normal. SKELETON No hypermetabolic marrow activity. IMPRESSION: 1. Bulky hypermetabolic RIGHT level 2 neck lymph nodes. 2. Hypermetabolic activity at the level of the RIGHT lingual tonsil without clear mass. 3. Single hypermetabolic periaortic lymph node in the thorax. 4. Small hypermetabolic pelvic and RIGHT inguinal lymph nodes are concerning for lymphoma involvement of pelvic lymph nodes. 5. Normal spleen. 6. No abnormal marrow activity to suggest lymphoma bone involvement . Electronically Signed   By: Suzy Bouchard M.D.   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 Patient imaging this is at least a stage IIIA. No overt evidence of bone marrow involvement but bone marrow biopsy is not planned for additional staging based on patient preference and the fact that it will not change treatment at this time . LDH level within normal limits FLIPI score - intermediate risk with 5 year OS 78% and ten-year overall survival 51% (from data in the pre-rituxan era)  #2 hypercalcemia - likely related to the lymphoma. Asymptomatic at this time with no renal insufficiency. No overt bone involvement with  lymphoma. Plan -Patient's primary symptomatic disease and the majority of her bulky disease is in the right upper neck. Patient notes that this is uncomfortable and bothersome to her symptoms wise and cosmetically. This along with the fact that the patient is hypercalcemic, suggests advantage to treating her primary bulky disease in the neck with ISRT to address these symptoms. -The rest of her disease is not life for organ threatening at this time and asymptomatic and she does not have any constitutional symptoms suggesting need for systemic therapies at this time. -We will start her on a prednisone burst followed by a quick taper to try to shrink her right neck mass and to help with hypercalcemia. -She has been given a referral to radiation oncology to consider evaluation for right neck radiation for her follicular lymphoma -No other acute bothersome symptoms at this time . -She would need repeat labs in 4-6 weeks to ensure resolution of her hypercalcemia. -She has been advised to drink at least 48-64 ounces of water daily and to discontinue any oral calcium supplementation. -If hypercalcemia persists after her treatment of the right neck mass and steroids might need to do additional workup for other causes of hypercalcemia including PTH, PTH RP and bone scan.  Return to care with Dr. Irene Limbo in 4-6 weeks with CBC, CMP and LDH  .  It is a privilege to take care of this wonderful person and I appreciate this interesting consultation.  All of the patients and her husbands questions were answered to their apparent satisfaction. The patient knows to call the clinic with any problems, questions or concerns.  I spent 30 minutes counseling the patient face to face. The total time spent in the appointment was 40 minutes and more than 50% was on counseling  regarding treatment options and the rationale prognosis and direct patient cares.    Sullivan Lone MD [place] AAHIVMS [facility] Hematology/Oncology Physician [facility]  (Office):       [phone] (Work cell):  [phone] (Fax):           [phone]

## 2016-01-19 ENCOUNTER — Ambulatory Visit
Admission: RE | Admit: 2016-01-19 | Discharge: 2016-01-19 | Disposition: A | Discharge status: Home / Self Care | Payer: Medicare Other | Source: Ambulatory Visit | Attending: Radiation Oncology | Admitting: Radiation Oncology

## 2016-01-19 ENCOUNTER — Encounter: Payer: Self-pay | Admitting: Radiation Oncology

## 2016-01-19 VITALS — BP 142/68 | HR 77 | Resp 16 | Ht 61.0 in | Wt 121.2 lb

## 2016-01-19 DIAGNOSIS — M810 Age-related osteoporosis without current pathological fracture: Secondary | ICD-10-CM | POA: Insufficient documentation

## 2016-01-19 DIAGNOSIS — E785 Hyperlipidemia, unspecified: Secondary | ICD-10-CM | POA: Insufficient documentation

## 2016-01-19 DIAGNOSIS — C8201 Follicular lymphoma grade I, lymph nodes of head, face, and neck: Secondary | ICD-10-CM | POA: Diagnosis present

## 2016-01-19 DIAGNOSIS — Z7982 Long term (current) use of aspirin: Secondary | ICD-10-CM | POA: Diagnosis not present

## 2016-01-19 DIAGNOSIS — C8511 Unspecified B-cell lymphoma, lymph nodes of head, face, and neck: Secondary | ICD-10-CM | POA: Insufficient documentation

## 2016-01-19 DIAGNOSIS — Z86010 Personal history of colonic polyps: Secondary | ICD-10-CM | POA: Insufficient documentation

## 2016-01-19 DIAGNOSIS — R221 Localized swelling, mass and lump, neck: Secondary | ICD-10-CM | POA: Diagnosis not present

## 2016-01-19 HISTORY — DX: Malignant (primary) neoplasm, unspecified: C80.1

## 2016-01-19 NOTE — Progress Notes (Signed)
[place]         [phone] ________________________________  Initial Outpatient Consultation  Name: [name] [name] MRN: [id number]  Date: 01/19/2016  DOB: Aug 10, 1934  REFERRING PHYSICIAN: Deland Pretty, MD  DIAGNOSIS:     ICD-9-CM ICD-10-CM   1. Neck mass 784.2 R22.1   2. 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 female who presented to her primary care physician with 3 months of asymptomatic fullness/swelling in her right upper neck with no overt symptoms of dental pain, swelling, fever, chills, or night sweats. Swelling in her neck is painless; however, it is growing. She was referred to Dr. Wilburn Cornelia for further evaluation and had a CT of the neck on 12/08/15 which showed findings "soft tissue mass measuring up to 3.2 cm which most resembles an abnormal right level 1 B lymph node. This medially displaces but seems to be separate from the adjacent right submandibular gland. There is a larger spiculated and somewhat infiltrative appearing right lateral neck mass centered at the level 2 nodal station measuring up to 4.7 cm. This lesion is inseparable from the undersurface of the right sternocleidomastoid muscle, but does not appear to invade the subjacent right carotid space. Associated small but asymmetrically enlarged and conspicuous right level 3 lymph nodes. No primary pharyngeal or laryngeal tumor identified." The patient subsequently had an incisional biopsy of the right upper neck lymph node on 12/19/15 which is consistent with a low-grade follicular lymphoma.  A tissue-flow cytometry was performed on 12/19/15. This revealed a monoclonal B-cell population with expression of Cd10. 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 Patient received a PET scan after a  prednisone burst and was referred to radiation oncology.  Patient has a history of lung carcinous tumor excised in 2008.She is on a prednisone taper down to prednisone 20 mg BID. Patient denies weight changes, swallowing concerns, or PEG.  PREVIOUS RADIATION THERAPY: No  Past Medical History:  Diagnosis Date  . Cancer ([facility])    follicular lymphoma  . Cataract   . History of colon polyps    all colonic mucosa  . Hyperlipidemia    on lipitor  . Osteoporosis   :   Past Surgical History:  Procedure Laterality Date  . COLONOSCOPY     [id number]  . EYE SURGERY Bilateral    cataract removal  . 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 POLYPECTOMY     2001,2006-all colonic mucosa   . SUBMANDIBULAR GLAND EXCISION Right 12/19/2015   Procedure: BIOPSY OF RIGHT SUBMANDIBULAR MASS;  Surgeon: Jerrell Belfast, MD;  Location: [facility] OR;  Service: ENT;  Laterality: Right;  :   Current Outpatient Prescriptions:  .  aspirin 81 MG tablet, Chew 81 mg by mouth daily., Disp: , Rfl:  .  atorvastatin (LIPITOR) 20 MG tablet, Take 20 mg by mouth., Disp: , Rfl:  .  Cholecalciferol (VITAMIN D) 2000 units CAPS, Take 2,000 Units by mouth daily., Disp: , Rfl:  .  denosumab (PROLIA) 60 MG/ML SOLN injection, Inject 60 mg into the skin every 6 (six) months. Administer in upper arm, thigh, or abdomen, Disp: , Rfl:  .  FORTEO 600 MCG/2.4ML SOLN, Inject 20 mcg into the skin daily., Disp: , Rfl:  .  Multiple Vitamins-Minerals (MULTIVITAMIN  WITH MINERALS) tablet, Take 1 tablet by mouth., Disp: , Rfl:  .  Omega-3 Fatty Acids (FISH OIL TRIPLE STRENGTH) 1400 MG CAPS, Take 1 capsule by mouth daily., Disp: , Rfl:  .  predniSONE (DELTASONE) 20 MG tablet, '60mg'$  po daily for 5 days then '40mg'$  po daily x 3 days then '20mg'$  daily x 3 days. Take in the morning with breakfast, Disp: 30 tablet, Rfl: 0:   Allergies  Allergen Reactions  . No Known Allergies   :   Family History  Problem Relation Age of Onset    . Colon cancer Neg Hx   . Colon polyps Neg Hx   . Esophageal cancer Neg Hx   . Rectal cancer Neg Hx   . Stomach cancer Neg Hx   :   Social History   Social History  . Marital status: Married    Spouse name: N/A  . Number of children: N/A  . Years of education: N/A   Occupational History  . Not on file.   Social History Main Topics  . Smoking status: Never Smoker  . Smokeless tobacco: Never Used  . Alcohol use No  . Drug use: No  . Sexual activity: Not on file   Other Topics Concern  . Not on file   Social History Narrative  . No narrative on file  :  REVIEW OF SYSTEMS:  A 15 point review of systems is documented in the electronic medical record. This was obtained by the nursing staff. However, I reviewed this with the patient to discuss relevant findings and make appropriate changes.  Pertinent items are noted in HPI.   PHYSICAL EXAM:  Blood pressure (!) 142/68, pulse 77, resp. rate 16, height '5\' 1"'$  (1.549 m), weight 121 lb 3.2 oz (55 kg), SpO2 100 %.  Alert and oriented, in no acute distress, NAD The patient's right neck is notable for fullness along the right angle of the mandible in level 1/2 and right upper neck level 2. There is a recent incision scar which is well healed. Exam is otherwise unremarkable per oncology.  GENERAL:alert, in no acute distress and comfortable SKIN: skin color, texture, turgor are normal, no rashes or significant lesions EYES: normal, conjunctiva are pink and non-injected, sclera clear OROPHARYNX:no exudate, no erythema and lips, buccal mucosa, and tongue normal  NECK: supple, no JVD, thyroid normal size, non-tender, without nodularity LYMPH:  Right upper neck palpable lymphadenopathy measuring about 4-5 cm in size unchanged from her last visit , no palpable axillary adenopathy .small palpable right inguinal adenopathy . LUNGS: clear to auscultation with normal respiratory effort HEART: regular rate & rhythm,  no murmurs and no lower  extremity edema ABDOMEN: abdomen soft, non-tender, normoactive bowel sounds , no palpable hepatosplenomegaly Musculoskeletal: no cyanosis of digits and no clubbing  PSYCH: alert & oriented x 3 with fluent speech NEURO: no focal motor/sensory deficits  ECOG PERFORMANCE STATUS: 1 - Symptomatic but completely ambulatory  GENERAL:alert, in no acute distress and comfortable SKIN: skin color, texture, turgor are normal, no rashes or significant lesions EYES: normal, conjunctiva are pink and non-injected, sclera clear OROPHARYNX:no exudate, no erythema and lips, buccal mucosa, and tongue normal  NECK: supple, no JVD, thyroid normal size, non-tender, without nodularity LYMPH:  Right upper neck palpable lymphadenopathy measuring about 4-5 cm in size unchanged from her last visit , no palpable axillary adenopathy .small palpable right inguinal adenopathy . LUNGS: clear to auscultation with normal respiratory effort HEART: regular rate & rhythm,  no murmurs and  no lower extremity edema ABDOMEN: abdomen soft, non-tender, normoactive bowel sounds , no palpable hepatosplenomegaly Musculoskeletal: no cyanosis of digits and no clubbing  PSYCH: alert & oriented x 3 with fluent speech NEURO: no focal motor/sensory deficits   KPS = 90  100 - Normal; no complaints; no evidence of disease. 90   - Able to carry on normal activity; minor signs or symptoms of disease. 80   - Normal activity with effort; some signs or symptoms of disease. 33   - Cares for self; unable to carry on normal activity or to do active work. 60   - Requires occasional assistance, but is able to care for most of his personal needs. 50   - Requires considerable assistance and frequent medical care. 86   - Disabled; requires special care and assistance. 19   - Severely disabled; hospital admission is indicated although death not imminent. 49   - Very sick; hospital admission necessary; active supportive treatment necessary. 10   -  Moribund; fatal processes progressing rapidly. 0     - Dead  Karnofsky DA, Abelmann [place], Craver LS and Austin JH [phone]) The use of the nitrogen mustards in the palliative treatment of carcinoma: with particular reference to bronchogenic carcinoma Cancer 1 634-56  LABORATORY DATA:  Lab Results  Component Value Date   WBC 9.9 01/13/2016   HGB 15.1 01/13/2016   HCT 44.5 01/13/2016   MCV 89.7 01/13/2016   PLT 265 01/13/2016   Lab Results  Component Value Date   NA 140 01/13/2016   K 3.7 01/13/2016   CL 105 12/17/2015   CO2 24 01/13/2016   Lab Results  Component Value Date   ALT 19 01/13/2016   AST 23 01/13/2016   ALKPHOS 66 01/13/2016   BILITOT 0.51 01/13/2016     RADIOGRAPHY: Nm Pet Image Initial (pi) Skull Base To Thigh  Result Date: 01/09/2016 CLINICAL DATA:  Initial treatment strategy for lymphoma. 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 EXAM: NUCLEAR MEDICINE PET SKULL BASE TO THIGH TECHNIQUE: Six mCi F-18 FDG was injected intravenously. Full-ring PET imaging was performed from the skull base to thigh after the radiotracer. CT data was obtained and used for attenuation correction and anatomic localization. FASTING BLOOD GLUCOSE:  Value: 86 mg/dl COMPARISON:  PET-CT 04/05/2016, CT neck 12/08/2015 FINDINGS: NECK Two large hypermetabolic RIGHT level II lymph nodes. More superior measures 21 mm short axis beneath the sternocleidomastoid muscle with SUV max equal 13.9. More inferior node is anterior to the sternocleidomastoid muscle measuring 27 mm short axis with SUV max equal 15.8. There is asymmetric hypermetabolic activity within the pharyngeal mucosa localizing to the RIGHT lingual tonsil region. CHEST Single hypermetabolic mediastinal lymph node anterior to the descending thoracic aorta adjacent to the esophagus measures 7 mm (image 63, series 4) with SUV max equal 5.7. No additional hypermetabolic mediastinal hilar lymph nodes. No hypermetabolic mediastinal or  hilar nodes. No suspicious pulmonary nodules on the CT scan. ABDOMEN/PELVIS Several minimally enlarged hypermetabolic pelvic lymph nodes are present. The most intense is a RIGHT inguinal lymph node measuring 13 mm with SUV max equal 5.6. Small LEFT obturator node measures only 6 mm (image 138, series 4) but intense for size with SUV max equal 4.5. Several similar RIGHT external iliac nodes. No hypermetabolic upper abdominal adenopathy. The spleen is normal size and metabolic activity. Liver normal. Kidneys appear normal. SKELETON No hypermetabolic marrow activity. IMPRESSION: 1. Bulky hypermetabolic RIGHT level 2 neck lymph nodes. 2. Hypermetabolic activity  at the level of the RIGHT lingual tonsil without clear mass. 3. Single hypermetabolic periaortic lymph node in the thorax. 4. Small hypermetabolic pelvic and RIGHT inguinal lymph nodes are concerning for lymphoma involvement of pelvic lymph nodes. 5. Normal spleen. 6. No abnormal marrow activity to suggest lymphoma bone involvement . Electronically Signed   By: Suzy Bouchard M.D.   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 She appears to have at least stage III A with the only area of symptomatic involvement in the right neck. Rather than proceeding with systemic therapy, she may benefit from involved site radiation (ISRT) to 24-30 Gy to provide durable palliation.   PLAN: Today, I talked to the patient and family about the findings and work-up thus far.  We discussed the natural history of early stage low grade follicular lymphoma and general treatment, highlighting the role of radiotherapy in the management.  We discussed the available radiation techniques, and focused on the details of logistics and delivery.  We reviewed the anticipated acute and late sequelae associated with radiation in this setting.  The patient was encouraged to ask questions that I answered to the best of my  ability.  I filled out a patient counseling form during our discussion including treatment diagrams.  We retained a copy for our records.  The patient would like to proceed with radiation and will be scheduled for CT simulation.  Patient is set up for simulation.  I spent time face to face with the patient and more than 50% of that time was spent in counseling and/or coordination of care.    ------------------------------------------------   Tyler Pita, MD [place] Director and Director of Stereotactic Radiosurgery Direct Dial: [phone]  Fax: [phone] [email or web address]  Skype  [email or web address]  This document serves as a record of services personally performed by Tyler Pita, MD. It was created on his behalf by Bethann Humble, a trained medical scribe. The creation of this record is based on the scribe's personal observations and the provider's statements to them. This document has been checked and approved by the attending provider.

## 2016-01-19 NOTE — Progress Notes (Signed)
Head and Neck Cancer Location of Tumor / Histology: newly diagnosed follicular lymphoma  Patient presented to her primary care physician with 3 months of asymptomatic fullness/swelling in her right upper neck with no overt symptoms of dental pain, swelling, fever, chills or night sweats. Swelling in neck is painless but, growing.    Nutrition Status Yes No Comments  Weight changes? '[]'$  '[x]'$    Swallowing concerns? '[]'$  '[x]'$    PEG? '[]'$  '[x]'$     Referrals Yes No Comments  Social Work? '[]'$  '[x]'$    Dentistry? '[]'$  '[x]'$  Dr. Sanjuana Mae  Swallowing therapy? '[]'$  '[x]'$    Nutrition? '[]'$  '[x]'$    Med/Onc? '[x]'$  '[]'$  Seen by Irene Limbo last on 01/13/16 with next appt set for 02/24/16   Safety Issues Yes No Comments  Prior radiation? '[]'$  '[x]'$    Pacemaker/ICD? '[]'$  '[x]'$    Possible current pregnancy? '[]'$  '[x]'$    Is the patient on methotrexate? '[]'$  '[x]'$     Tobacco/Marijuana/Snuff/ETOH use: Never  Past/Anticipated interventions by otolaryngology, if any: Dr. Wilburn Cornelia performed an incisional biopsy and referred the patient to Dr. Irene Limbo.   Past/Anticipated interventions by medical oncology, if any: PET scan, prednisone burst, referral to radiation oncology, follow up in 4-6 weeks.     Current Complaints / other details:  81 year old. Married. 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 Hx of lung carcinois tumor excised in 2008. On prednisone taper down to prednisone 20 mg bid. Chief Complaint  Patient presents with  . Cancer    Consult/Follicular lymphoma/Dr. Manning/Referral from Dr. Irene Limbo

## 2016-01-19 NOTE — Progress Notes (Signed)
See progress note under physician encounter. 

## 2016-01-23 ENCOUNTER — Ambulatory Visit
Admission: RE | Admit: 2016-01-23 | Discharge: 2016-01-23 | Disposition: A | Discharge status: Home / Self Care | Payer: Medicare Other | Source: Ambulatory Visit | Attending: Radiation Oncology | Admitting: Radiation Oncology

## 2016-01-23 DIAGNOSIS — C8201 Follicular lymphoma grade I, lymph nodes of head, face, and neck: Secondary | ICD-10-CM

## 2016-01-23 DIAGNOSIS — M810 Age-related osteoporosis without current pathological fracture: Secondary | ICD-10-CM | POA: Diagnosis not present

## 2016-01-23 DIAGNOSIS — R221 Localized swelling, mass and lump, neck: Secondary | ICD-10-CM | POA: Diagnosis not present

## 2016-01-23 DIAGNOSIS — Z7982 Long term (current) use of aspirin: Secondary | ICD-10-CM | POA: Diagnosis not present

## 2016-01-23 DIAGNOSIS — C8511 Unspecified B-cell lymphoma, lymph nodes of head, face, and neck: Secondary | ICD-10-CM | POA: Diagnosis not present

## 2016-01-23 DIAGNOSIS — E785 Hyperlipidemia, unspecified: Secondary | ICD-10-CM | POA: Diagnosis not present

## 2016-01-23 DIAGNOSIS — Z86010 Personal history of colonic polyps: Secondary | ICD-10-CM | POA: Diagnosis not present

## 2016-01-23 NOTE — Progress Notes (Signed)
  Radiation Oncology         [phone] ________________________________  Name: [name] [name] MRN: [id number]  Date: 01/23/2016  DOB: May 03, 1934  SIMULATION AND TREATMENT PLANNING NOTE    ICD-9-CM ICD-10-CM   1. 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  Identity was confirmed.  All relevant records and images related to the planned course of therapy were reviewed.  The patient freely provided informed written consent to proceed with treatment after reviewing the details related to the planned course of therapy. The consent form was witnessed and verified by the simulation staff.  Then, the patient was set-up in a stable reproducible  supine position for radiation therapy.  CT images were obtained.  Surface markings were placed.  The CT images were loaded into the planning software.  Then the target and avoidance structures were contoured.  Treatment planning then occurred.  The radiation prescription was entered and confirmed.  Then, I designed and supervised the construction of a total of 3 medically necessary complex treatment devices with mask and 2 MLCs.  I have requested : 3D Simulation  I have requested a DVH of the following structures: spinal cord, brachial plexus, target.  I have ordered:Nutrition Consult  PLAN:  The patient will receive 30 Gy in 15 fractions.  ________________________________  Sheral Apley Tammi Klippel, M.D.

## 2016-02-04 DIAGNOSIS — E785 Hyperlipidemia, unspecified: Secondary | ICD-10-CM | POA: Diagnosis not present

## 2016-02-04 DIAGNOSIS — Z86010 Personal history of colonic polyps: Secondary | ICD-10-CM | POA: Diagnosis not present

## 2016-02-04 DIAGNOSIS — C8511 Unspecified B-cell lymphoma, lymph nodes of head, face, and neck: Secondary | ICD-10-CM | POA: Diagnosis not present

## 2016-02-04 DIAGNOSIS — C8201 Follicular lymphoma grade I, lymph nodes of head, face, and neck: Secondary | ICD-10-CM | POA: Diagnosis not present

## 2016-02-04 DIAGNOSIS — R221 Localized swelling, mass and lump, neck: Secondary | ICD-10-CM | POA: Diagnosis not present

## 2016-02-04 DIAGNOSIS — Z7982 Long term (current) use of aspirin: Secondary | ICD-10-CM | POA: Diagnosis not present

## 2016-02-04 DIAGNOSIS — M810 Age-related osteoporosis without current pathological fracture: Secondary | ICD-10-CM | POA: Diagnosis not present

## 2016-02-17 ENCOUNTER — Ambulatory Visit
Admission: RE | Admit: 2016-02-17 | Discharge: 2016-02-17 | Disposition: A | Discharge status: Home / Self Care | Payer: Medicare Other | Source: Ambulatory Visit | Attending: Radiation Oncology | Admitting: Radiation Oncology

## 2016-02-17 ENCOUNTER — Encounter: Payer: Self-pay | Admitting: Radiation Oncology

## 2016-02-17 DIAGNOSIS — R221 Localized swelling, mass and lump, neck: Secondary | ICD-10-CM | POA: Diagnosis not present

## 2016-02-17 DIAGNOSIS — C8511 Unspecified B-cell lymphoma, lymph nodes of head, face, and neck: Secondary | ICD-10-CM | POA: Diagnosis not present

## 2016-02-17 DIAGNOSIS — Z7982 Long term (current) use of aspirin: Secondary | ICD-10-CM | POA: Diagnosis not present

## 2016-02-17 DIAGNOSIS — M810 Age-related osteoporosis without current pathological fracture: Secondary | ICD-10-CM | POA: Diagnosis not present

## 2016-02-17 DIAGNOSIS — Z86010 Personal history of colonic polyps: Secondary | ICD-10-CM | POA: Diagnosis not present

## 2016-02-17 DIAGNOSIS — C8201 Follicular lymphoma grade I, lymph nodes of head, face, and neck: Secondary | ICD-10-CM | POA: Diagnosis not present

## 2016-02-17 DIAGNOSIS — E785 Hyperlipidemia, unspecified: Secondary | ICD-10-CM | POA: Diagnosis not present

## 2016-02-17 MED ORDER — SONAFINE EX EMUL
1.0000 "application " | Freq: Two times a day (BID) | CUTANEOUS | Status: DC
  Administered 2016-02-17: 1 via TOPICAL

## 2016-02-18 ENCOUNTER — Ambulatory Visit
Admission: RE | Admit: 2016-02-18 | Discharge: 2016-02-18 | Disposition: A | Discharge status: Home / Self Care | Payer: Medicare Other | Source: Ambulatory Visit | Attending: Radiation Oncology | Admitting: Radiation Oncology

## 2016-02-18 ENCOUNTER — Ambulatory Visit: Payer: Medicare Other | Admitting: Radiation Oncology

## 2016-02-18 DIAGNOSIS — C8511 Unspecified B-cell lymphoma, lymph nodes of head, face, and neck: Secondary | ICD-10-CM | POA: Diagnosis not present

## 2016-02-18 DIAGNOSIS — Z7982 Long term (current) use of aspirin: Secondary | ICD-10-CM | POA: Diagnosis not present

## 2016-02-18 DIAGNOSIS — R221 Localized swelling, mass and lump, neck: Secondary | ICD-10-CM | POA: Diagnosis not present

## 2016-02-18 DIAGNOSIS — M810 Age-related osteoporosis without current pathological fracture: Secondary | ICD-10-CM | POA: Diagnosis not present

## 2016-02-18 DIAGNOSIS — Z86010 Personal history of colonic polyps: Secondary | ICD-10-CM | POA: Diagnosis not present

## 2016-02-18 DIAGNOSIS — C8201 Follicular lymphoma grade I, lymph nodes of head, face, and neck: Secondary | ICD-10-CM

## 2016-02-18 DIAGNOSIS — E785 Hyperlipidemia, unspecified: Secondary | ICD-10-CM | POA: Diagnosis not present

## 2016-02-18 MED ORDER — SONAFINE EX EMUL
1.0000 "application " | Freq: Two times a day (BID) | CUTANEOUS | Status: DC
  Administered 2016-02-18: 1 via TOPICAL

## 2016-02-19 ENCOUNTER — Ambulatory Visit
Admission: RE | Admit: 2016-02-19 | Discharge: 2016-02-19 | Disposition: A | Discharge status: Home / Self Care | Payer: Medicare Other | Source: Ambulatory Visit | Attending: Radiation Oncology | Admitting: Radiation Oncology

## 2016-02-19 VITALS — BP 177/82 | HR 86 | Resp 16 | Wt 122.8 lb

## 2016-02-19 DIAGNOSIS — Z86010 Personal history of colonic polyps: Secondary | ICD-10-CM | POA: Diagnosis not present

## 2016-02-19 DIAGNOSIS — C8511 Unspecified B-cell lymphoma, lymph nodes of head, face, and neck: Secondary | ICD-10-CM | POA: Diagnosis not present

## 2016-02-19 DIAGNOSIS — R221 Localized swelling, mass and lump, neck: Secondary | ICD-10-CM | POA: Diagnosis not present

## 2016-02-19 DIAGNOSIS — M810 Age-related osteoporosis without current pathological fracture: Secondary | ICD-10-CM | POA: Diagnosis not present

## 2016-02-19 DIAGNOSIS — E785 Hyperlipidemia, unspecified: Secondary | ICD-10-CM | POA: Diagnosis not present

## 2016-02-19 DIAGNOSIS — C8201 Follicular lymphoma grade I, lymph nodes of head, face, and neck: Secondary | ICD-10-CM | POA: Diagnosis not present

## 2016-02-19 DIAGNOSIS — Z7982 Long term (current) use of aspirin: Secondary | ICD-10-CM | POA: Diagnosis not present

## 2016-02-19 NOTE — Progress Notes (Signed)
Weight and vitals stable. Denies pain. No skin changes noted within treatment field. Reports using sonafine as directed. Denies difficulty associated with swallowing. Denies fatigue. Patient is without complaints today.   BP (!) 177/82 (BP Location: Left Arm, Patient Position: Sitting, Cuff Size: Normal)   Pulse 86   Resp 16   Wt 122 lb 12.8 oz (55.7 kg)   SpO2 100%   BMI 23.20 kg/m  Wt Readings from Last 3 Encounters:  02/19/16 122 lb 12.8 oz (55.7 kg)  01/19/16 121 lb 3.2 oz (55 kg)  01/01/16 120 lb 6.4 oz (54.6 kg)

## 2016-02-19 NOTE — Progress Notes (Signed)
  Radiation Oncology         [phone]   Name: [name] [name] MRN: [id number]   Date: 02/19/2016  DOB: 1935/01/02   Weekly Radiation Therapy Management    ICD-9-CM ICD-10-CM   1. Follicular lymphoma grade i, lymph nodes of head, face, and neck (HCC) 202.01 C82.01     Current Dose: 6 Gy  Planned Dose:  30 Gy  Narrative The patient presents for routine under treatment assessment.  Weight and vitals stable. 3 out of 15 fractions completed. The patient denies pain. Per nursing, no skin changes noted in the treatment field. The patient reports using sonafine cream as directed. Denies difficulty associated with swallowing, though she notes a sore throat. Denies fatigue. Patient is without complaint today.  The patient is without complaint. Set-up films were reviewed. The chart was checked.  Physical Findings  weight is 122 lb 12.8 oz (55.7 kg). Her blood pressure is 177/82 (abnormal) and her pulse is 86. Her respiration is 16 and oxygen saturation is 100%. . Weight essentially stable.  No significant changes.  Impression The patient is tolerating radiation.  Plan Continue treatment as planned.         Sheral Apley Tammi Klippel, M.D. This document serves as a record of services personally performed by Tyler Pita, MD. It was created on his behalf by Maryla Morrow, a trained medical scribe. The creation of this record is based on the scribe's personal observations and the provider's statements to them. This document has been checked and approved by the attending provider.

## 2016-02-20 ENCOUNTER — Ambulatory Visit
Admission: RE | Admit: 2016-02-20 | Discharge: 2016-02-20 | Disposition: A | Discharge status: Home / Self Care | Payer: Medicare Other | Source: Ambulatory Visit | Attending: Radiation Oncology | Admitting: Radiation Oncology

## 2016-02-20 DIAGNOSIS — E785 Hyperlipidemia, unspecified: Secondary | ICD-10-CM | POA: Diagnosis not present

## 2016-02-20 DIAGNOSIS — C8201 Follicular lymphoma grade I, lymph nodes of head, face, and neck: Secondary | ICD-10-CM | POA: Diagnosis not present

## 2016-02-20 DIAGNOSIS — Z86010 Personal history of colonic polyps: Secondary | ICD-10-CM | POA: Diagnosis not present

## 2016-02-20 DIAGNOSIS — M810 Age-related osteoporosis without current pathological fracture: Secondary | ICD-10-CM | POA: Diagnosis not present

## 2016-02-20 DIAGNOSIS — C8511 Unspecified B-cell lymphoma, lymph nodes of head, face, and neck: Secondary | ICD-10-CM | POA: Diagnosis not present

## 2016-02-20 DIAGNOSIS — Z7982 Long term (current) use of aspirin: Secondary | ICD-10-CM | POA: Diagnosis not present

## 2016-02-20 DIAGNOSIS — R221 Localized swelling, mass and lump, neck: Secondary | ICD-10-CM | POA: Diagnosis not present

## 2016-02-23 ENCOUNTER — Ambulatory Visit
Admission: RE | Admit: 2016-02-23 | Discharge: 2016-02-23 | Disposition: A | Discharge status: Home / Self Care | Payer: Medicare Other | Source: Ambulatory Visit | Attending: Radiation Oncology | Admitting: Radiation Oncology

## 2016-02-23 DIAGNOSIS — C8201 Follicular lymphoma grade I, lymph nodes of head, face, and neck: Secondary | ICD-10-CM | POA: Diagnosis not present

## 2016-02-23 DIAGNOSIS — Z7982 Long term (current) use of aspirin: Secondary | ICD-10-CM | POA: Diagnosis not present

## 2016-02-23 DIAGNOSIS — R221 Localized swelling, mass and lump, neck: Secondary | ICD-10-CM | POA: Diagnosis not present

## 2016-02-23 DIAGNOSIS — E785 Hyperlipidemia, unspecified: Secondary | ICD-10-CM | POA: Diagnosis not present

## 2016-02-23 DIAGNOSIS — Z86010 Personal history of colonic polyps: Secondary | ICD-10-CM | POA: Diagnosis not present

## 2016-02-23 DIAGNOSIS — C8511 Unspecified B-cell lymphoma, lymph nodes of head, face, and neck: Secondary | ICD-10-CM | POA: Diagnosis not present

## 2016-02-23 DIAGNOSIS — M810 Age-related osteoporosis without current pathological fracture: Secondary | ICD-10-CM | POA: Diagnosis not present

## 2016-02-24 ENCOUNTER — Ambulatory Visit: Payer: Medicare Other | Admitting: Hematology

## 2016-02-24 ENCOUNTER — Other Ambulatory Visit: Payer: Medicare Other

## 2016-02-24 ENCOUNTER — Ambulatory Visit
Admission: RE | Admit: 2016-02-24 | Discharge: 2016-02-24 | Disposition: A | Discharge status: Home / Self Care | Payer: Medicare Other | Source: Ambulatory Visit | Attending: Radiation Oncology | Admitting: Radiation Oncology

## 2016-02-24 DIAGNOSIS — Z86010 Personal history of colonic polyps: Secondary | ICD-10-CM | POA: Diagnosis not present

## 2016-02-24 DIAGNOSIS — C8201 Follicular lymphoma grade I, lymph nodes of head, face, and neck: Secondary | ICD-10-CM | POA: Diagnosis not present

## 2016-02-24 DIAGNOSIS — Z7982 Long term (current) use of aspirin: Secondary | ICD-10-CM | POA: Diagnosis not present

## 2016-02-24 DIAGNOSIS — E785 Hyperlipidemia, unspecified: Secondary | ICD-10-CM | POA: Diagnosis not present

## 2016-02-24 DIAGNOSIS — M810 Age-related osteoporosis without current pathological fracture: Secondary | ICD-10-CM | POA: Diagnosis not present

## 2016-02-24 DIAGNOSIS — R221 Localized swelling, mass and lump, neck: Secondary | ICD-10-CM | POA: Diagnosis not present

## 2016-02-24 DIAGNOSIS — C8511 Unspecified B-cell lymphoma, lymph nodes of head, face, and neck: Secondary | ICD-10-CM | POA: Diagnosis not present

## 2016-02-25 ENCOUNTER — Ambulatory Visit
Admission: RE | Admit: 2016-02-25 | Discharge: 2016-02-25 | Disposition: A | Discharge status: Home / Self Care | Payer: Medicare Other | Source: Ambulatory Visit | Attending: Radiation Oncology | Admitting: Radiation Oncology

## 2016-02-25 DIAGNOSIS — C8511 Unspecified B-cell lymphoma, lymph nodes of head, face, and neck: Secondary | ICD-10-CM | POA: Diagnosis not present

## 2016-02-25 DIAGNOSIS — R221 Localized swelling, mass and lump, neck: Secondary | ICD-10-CM | POA: Diagnosis not present

## 2016-02-25 DIAGNOSIS — M810 Age-related osteoporosis without current pathological fracture: Secondary | ICD-10-CM | POA: Diagnosis not present

## 2016-02-25 DIAGNOSIS — C8201 Follicular lymphoma grade I, lymph nodes of head, face, and neck: Secondary | ICD-10-CM | POA: Diagnosis not present

## 2016-02-25 DIAGNOSIS — E785 Hyperlipidemia, unspecified: Secondary | ICD-10-CM | POA: Diagnosis not present

## 2016-02-25 DIAGNOSIS — Z86010 Personal history of colonic polyps: Secondary | ICD-10-CM | POA: Diagnosis not present

## 2016-02-25 DIAGNOSIS — Z7982 Long term (current) use of aspirin: Secondary | ICD-10-CM | POA: Diagnosis not present

## 2016-02-26 ENCOUNTER — Ambulatory Visit
Admission: RE | Admit: 2016-02-26 | Discharge: 2016-02-26 | Disposition: A | Discharge status: Home / Self Care | Payer: Medicare Other | Source: Ambulatory Visit | Attending: Radiation Oncology | Admitting: Radiation Oncology

## 2016-02-26 ENCOUNTER — Encounter: Payer: Self-pay | Admitting: Radiation Oncology

## 2016-02-26 DIAGNOSIS — R221 Localized swelling, mass and lump, neck: Secondary | ICD-10-CM | POA: Diagnosis not present

## 2016-02-26 DIAGNOSIS — Z7982 Long term (current) use of aspirin: Secondary | ICD-10-CM | POA: Diagnosis not present

## 2016-02-26 DIAGNOSIS — E785 Hyperlipidemia, unspecified: Secondary | ICD-10-CM | POA: Diagnosis not present

## 2016-02-26 DIAGNOSIS — C8201 Follicular lymphoma grade I, lymph nodes of head, face, and neck: Secondary | ICD-10-CM | POA: Diagnosis not present

## 2016-02-26 DIAGNOSIS — Z86010 Personal history of colonic polyps: Secondary | ICD-10-CM | POA: Diagnosis not present

## 2016-02-26 DIAGNOSIS — M810 Age-related osteoporosis without current pathological fracture: Secondary | ICD-10-CM | POA: Diagnosis not present

## 2016-02-26 DIAGNOSIS — C8511 Unspecified B-cell lymphoma, lymph nodes of head, face, and neck: Secondary | ICD-10-CM | POA: Diagnosis not present

## 2016-02-26 NOTE — Progress Notes (Signed)
Weight and Weight and vitals stable. Denies pain. No skin changes noted within treatment field. Reports using sonafine as directed. Denies difficulty associated with swallowing; feeling thickness in mouth and having more of a dry mouth drinking more liquids.  Denies fatigue.   Appetite is good. Wt Readings from Last 3 Encounters:  02/26/16 124 lb 3.2 oz (56.3 kg)  02/19/16 122 lb 12.8 oz (55.7 kg)  01/19/16 121 lb 3.2 oz (55 kg)  BP (!) 147/78   Pulse 76   Temp 98.5 F (36.9 C) (Oral)   Resp 16   Ht '5\' 1"'$  (1.549 m)   Wt 124 lb 3.2 oz (56.3 kg)   SpO2 96%   BMI 23.47 kg/m

## 2016-02-27 ENCOUNTER — Ambulatory Visit
Admission: RE | Admit: 2016-02-27 | Discharge: 2016-02-27 | Disposition: A | Discharge status: Home / Self Care | Payer: Medicare Other | Source: Ambulatory Visit | Attending: Radiation Oncology | Admitting: Radiation Oncology

## 2016-02-27 ENCOUNTER — Ambulatory Visit: Admission: RE | Admit: 2016-02-27 | Payer: Medicare Other | Source: Ambulatory Visit | Admitting: Radiation Oncology

## 2016-02-27 DIAGNOSIS — Z7982 Long term (current) use of aspirin: Secondary | ICD-10-CM | POA: Diagnosis not present

## 2016-02-27 DIAGNOSIS — M810 Age-related osteoporosis without current pathological fracture: Secondary | ICD-10-CM | POA: Diagnosis not present

## 2016-02-27 DIAGNOSIS — C8201 Follicular lymphoma grade I, lymph nodes of head, face, and neck: Secondary | ICD-10-CM | POA: Diagnosis not present

## 2016-02-27 DIAGNOSIS — R221 Localized swelling, mass and lump, neck: Secondary | ICD-10-CM | POA: Diagnosis not present

## 2016-02-27 DIAGNOSIS — Z86010 Personal history of colonic polyps: Secondary | ICD-10-CM | POA: Diagnosis not present

## 2016-02-27 DIAGNOSIS — E785 Hyperlipidemia, unspecified: Secondary | ICD-10-CM | POA: Diagnosis not present

## 2016-02-27 DIAGNOSIS — C8511 Unspecified B-cell lymphoma, lymph nodes of head, face, and neck: Secondary | ICD-10-CM | POA: Diagnosis not present

## 2016-03-01 ENCOUNTER — Ambulatory Visit
Admission: RE | Admit: 2016-03-01 | Discharge: 2016-03-01 | Disposition: A | Discharge status: Home / Self Care | Payer: Medicare Other | Source: Ambulatory Visit | Attending: Radiation Oncology | Admitting: Radiation Oncology

## 2016-03-01 VITALS — HR 78 | Temp 97.9°F | Resp 18 | Ht 61.0 in | Wt 119.4 lb

## 2016-03-01 DIAGNOSIS — Z7982 Long term (current) use of aspirin: Secondary | ICD-10-CM | POA: Diagnosis not present

## 2016-03-01 DIAGNOSIS — E785 Hyperlipidemia, unspecified: Secondary | ICD-10-CM | POA: Diagnosis not present

## 2016-03-01 DIAGNOSIS — C8511 Unspecified B-cell lymphoma, lymph nodes of head, face, and neck: Secondary | ICD-10-CM | POA: Diagnosis not present

## 2016-03-01 DIAGNOSIS — C8201 Follicular lymphoma grade I, lymph nodes of head, face, and neck: Secondary | ICD-10-CM | POA: Diagnosis not present

## 2016-03-01 DIAGNOSIS — Z86010 Personal history of colonic polyps: Secondary | ICD-10-CM | POA: Diagnosis not present

## 2016-03-01 DIAGNOSIS — R221 Localized swelling, mass and lump, neck: Secondary | ICD-10-CM | POA: Diagnosis not present

## 2016-03-01 DIAGNOSIS — M810 Age-related osteoporosis without current pathological fracture: Secondary | ICD-10-CM | POA: Diagnosis not present

## 2016-03-01 NOTE — Progress Notes (Signed)
Weight and Weight and vitals stable. Denies pain. No skin changes noted within treatment field. Reports using sonafine as directed. Denies difficulty associated with swallowing.  Reports feeling thickness in mouth and having more of a dry mouth drinking more liquids mostly water and throat feels thick like  symptom cold.  Does not have any taste of her food. Denies fatigue.   Appetite is good. Wt Readings from Last 3 Encounters:  03/01/16 119 lb 6.4 oz (54.2 kg)  02/26/16 124 lb 3.2 oz (56.3 kg)  02/19/16 122 lb 12.8 oz (55.7 kg)  5 pound weight loss she does not feel the scales was correct. Pulse 78   Temp 97.9 F (36.6 C) (Oral)   Resp 18   Ht '5\' 1"'$  (1.549 m)   Wt 119 lb 6.4 oz (54.2 kg)   SpO2 96%   BMI 22.56 kg/m

## 2016-03-01 NOTE — Progress Notes (Signed)
  Radiation Oncology         [phone]   Name: [name] [name] MRN: [id number]   Date: 03/01/2016  DOB: 25-Sep-1934   Weekly Radiation Therapy Management    ICD-9-CM ICD-10-CM   1. Follicular lymphoma grade i, lymph nodes of head, face, and neck (HCC) 202.01 C82.01     Current Dose: 20 Gy  Planned Dose:  30 Gy  Narrative The patient presents for routine under treatment assessment.  Denies pain. The nurse noted no skin changes within the treatment field. Reports using sonafine as directed. Denies difficulty associated with swallowing.  Reports feeling thickness in mouth and having more of a dry mouth. Drinking more liquids; mostly water and throat feels thick like the symptoms of a cold. She is using a baking soda rinse.Does not have any taste of her food. Good appetite. Denies fatigue. The patient denies hair loss.  Set-up films were reviewed. The chart was checked.  Physical Findings  height is '5\' 1"'$  (1.549 m) and weight is 119 lb 6.4 oz (54.2 kg). Her oral temperature is 97.9 F (36.6 C). Her pulse is 78. Her respiration is 18 and oxygen saturation is 96%. . Weight essentially stable.  No significant changes. Oropharynx clear of thrush.  Impression The patient is tolerating radiation.  Plan Continue treatment as planned.         Sheral Apley Tammi Klippel, M.D.  This document serves as a record of services personally performed by Tyler Pita, MD. It was created on his behalf by Darcus Austin, a trained medical scribe. The creation of this record is based on the scribe's personal observations and the provider's statements to them. This document has been checked and approved by the attending provider.

## 2016-03-02 ENCOUNTER — Encounter: Payer: Self-pay | Admitting: Hematology

## 2016-03-02 ENCOUNTER — Other Ambulatory Visit (HOSPITAL_BASED_OUTPATIENT_CLINIC_OR_DEPARTMENT_OTHER): Payer: Medicare Other

## 2016-03-02 ENCOUNTER — Ambulatory Visit
Admission: RE | Admit: 2016-03-02 | Discharge: 2016-03-02 | Disposition: A | Discharge status: Home / Self Care | Payer: Medicare Other | Source: Ambulatory Visit | Attending: Radiation Oncology | Admitting: Radiation Oncology

## 2016-03-02 ENCOUNTER — Ambulatory Visit (HOSPITAL_BASED_OUTPATIENT_CLINIC_OR_DEPARTMENT_OTHER): Payer: Medicare Other | Admitting: Hematology

## 2016-03-02 VITALS — BP 154/63 | HR 80 | Temp 98.2°F | Resp 18 | Wt 119.1 lb

## 2016-03-02 DIAGNOSIS — M818 Other osteoporosis without current pathological fracture: Secondary | ICD-10-CM | POA: Diagnosis not present

## 2016-03-02 DIAGNOSIS — K1233 Oral mucositis (ulcerative) due to radiation: Secondary | ICD-10-CM

## 2016-03-02 DIAGNOSIS — C8201 Follicular lymphoma grade I, lymph nodes of head, face, and neck: Secondary | ICD-10-CM

## 2016-03-02 DIAGNOSIS — C8511 Unspecified B-cell lymphoma, lymph nodes of head, face, and neck: Secondary | ICD-10-CM | POA: Diagnosis not present

## 2016-03-02 DIAGNOSIS — E785 Hyperlipidemia, unspecified: Secondary | ICD-10-CM | POA: Diagnosis not present

## 2016-03-02 DIAGNOSIS — Z7982 Long term (current) use of aspirin: Secondary | ICD-10-CM | POA: Diagnosis not present

## 2016-03-02 DIAGNOSIS — M810 Age-related osteoporosis without current pathological fracture: Secondary | ICD-10-CM | POA: Diagnosis not present

## 2016-03-02 DIAGNOSIS — R221 Localized swelling, mass and lump, neck: Secondary | ICD-10-CM | POA: Diagnosis not present

## 2016-03-02 DIAGNOSIS — Z86010 Personal history of colonic polyps: Secondary | ICD-10-CM | POA: Diagnosis not present

## 2016-03-02 LAB — COMPREHENSIVE METABOLIC PANEL
ALBUMIN: 4.1 g/dL (ref 3.5–5.0)
ALK PHOS: 73 U/L (ref 40–150)
ALT: 18 U/L (ref 0–55)
AST: 24 U/L (ref 5–34)
Anion Gap: 11 mEq/L (ref 3–11)
BUN: 11.4 mg/dL (ref 7.0–26.0)
CO2: 26 meq/L (ref 22–29)
Calcium: 10.6 mg/dL — ABNORMAL HIGH (ref 8.4–10.4)
Chloride: 105 mEq/L (ref 98–109)
Creatinine: 0.8 mg/dL (ref 0.6–1.1)
EGFR: 68 mL/min/{1.73_m2} — ABNORMAL LOW (ref >90)
GLUCOSE: 97 mg/dL (ref 70–140)
POTASSIUM: 4.3 meq/L (ref 3.5–5.1)
SODIUM: 142 meq/L (ref 136–145)
TOTAL PROTEIN: 7.5 g/dL (ref 6.4–8.3)
Total Bilirubin: 0.69 mg/dL (ref 0.20–1.20)

## 2016-03-02 LAB — CBC & DIFF AND RETIC
BASO%: 0.6 % (ref 0.0–2.0)
Basophils Absolute: 0.1 10*3/uL (ref 0.0–0.1)
EOS ABS: 0.1 10*3/uL (ref 0.0–0.5)
EOS%: 0.8 % (ref 0.0–7.0)
HCT: 43.8 % (ref 34.8–46.6)
HEMOGLOBIN: 14.5 g/dL (ref 11.6–15.9)
IMMATURE RETIC FRACT: 3 % (ref 1.60–10.00)
LYMPH#: 1.1 10*3/uL (ref 0.9–3.3)
LYMPH%: 11.1 % — ABNORMAL LOW (ref 14.0–49.7)
MCH: 29.9 pg (ref 25.1–34.0)
MCHC: 33.1 g/dL (ref 31.5–36.0)
MCV: 90.3 fL (ref 79.5–101.0)
MONO#: 0.9 10*3/uL (ref 0.1–0.9)
MONO%: 9.5 % (ref 0.0–14.0)
NEUT%: 78 % — ABNORMAL HIGH (ref 38.4–76.8)
NEUTROS ABS: 7.6 10*3/uL — AB (ref 1.5–6.5)
Platelets: 242 10*3/uL (ref 145–400)
RBC: 4.85 10*6/uL (ref 3.70–5.45)
RDW: 14.6 % — AB (ref 11.2–14.5)
RETIC %: 1.31 % (ref 0.70–2.10)
Retic Ct Abs: 63.54 10*3/uL (ref 33.70–90.70)
WBC: 9.7 10*3/uL (ref 3.9–10.3)

## 2016-03-02 LAB — LACTATE DEHYDROGENASE: LDH: 181 U/L (ref 125–245)

## 2016-03-02 MED ORDER — SUCRALFATE 1 GM/10ML PO SUSP: 1.0000 g | Freq: Three times a day (TID) | ORAL | 0 refills | Status: DC

## 2016-03-02 MED ORDER — MAGIC MOUTHWASH W/LIDOCAINE: 5.0000 mL | Freq: Four times a day (QID) | ORAL | 1 refills | Status: DC | PRN

## 2016-03-03 ENCOUNTER — Ambulatory Visit
Admission: RE | Admit: 2016-03-03 | Discharge: 2016-03-03 | Disposition: A | Discharge status: Home / Self Care | Payer: Medicare Other | Source: Ambulatory Visit | Attending: Radiation Oncology | Admitting: Radiation Oncology

## 2016-03-03 DIAGNOSIS — Z7982 Long term (current) use of aspirin: Secondary | ICD-10-CM | POA: Diagnosis not present

## 2016-03-03 DIAGNOSIS — C8511 Unspecified B-cell lymphoma, lymph nodes of head, face, and neck: Secondary | ICD-10-CM | POA: Diagnosis not present

## 2016-03-03 DIAGNOSIS — C8201 Follicular lymphoma grade I, lymph nodes of head, face, and neck: Secondary | ICD-10-CM | POA: Diagnosis not present

## 2016-03-03 DIAGNOSIS — R221 Localized swelling, mass and lump, neck: Secondary | ICD-10-CM | POA: Diagnosis not present

## 2016-03-03 DIAGNOSIS — M810 Age-related osteoporosis without current pathological fracture: Secondary | ICD-10-CM | POA: Diagnosis not present

## 2016-03-03 DIAGNOSIS — Z86010 Personal history of colonic polyps: Secondary | ICD-10-CM | POA: Diagnosis not present

## 2016-03-03 DIAGNOSIS — E785 Hyperlipidemia, unspecified: Secondary | ICD-10-CM | POA: Diagnosis not present

## 2016-03-04 ENCOUNTER — Ambulatory Visit
Admission: RE | Admit: 2016-03-04 | Discharge: 2016-03-04 | Disposition: A | Discharge status: Home / Self Care | Payer: Medicare Other | Source: Ambulatory Visit | Attending: Radiation Oncology | Admitting: Radiation Oncology

## 2016-03-04 DIAGNOSIS — C8511 Unspecified B-cell lymphoma, lymph nodes of head, face, and neck: Secondary | ICD-10-CM | POA: Diagnosis not present

## 2016-03-04 DIAGNOSIS — Z86010 Personal history of colonic polyps: Secondary | ICD-10-CM | POA: Diagnosis not present

## 2016-03-04 DIAGNOSIS — R221 Localized swelling, mass and lump, neck: Secondary | ICD-10-CM | POA: Diagnosis not present

## 2016-03-04 DIAGNOSIS — E785 Hyperlipidemia, unspecified: Secondary | ICD-10-CM | POA: Diagnosis not present

## 2016-03-04 DIAGNOSIS — M810 Age-related osteoporosis without current pathological fracture: Secondary | ICD-10-CM | POA: Diagnosis not present

## 2016-03-04 DIAGNOSIS — Z7982 Long term (current) use of aspirin: Secondary | ICD-10-CM | POA: Diagnosis not present

## 2016-03-04 DIAGNOSIS — C8201 Follicular lymphoma grade I, lymph nodes of head, face, and neck: Secondary | ICD-10-CM | POA: Diagnosis not present

## 2016-03-05 ENCOUNTER — Ambulatory Visit
Admission: RE | Admit: 2016-03-05 | Discharge: 2016-03-05 | Disposition: A | Discharge status: Home / Self Care | Payer: Medicare Other | Source: Ambulatory Visit | Attending: Radiation Oncology | Admitting: Radiation Oncology

## 2016-03-05 DIAGNOSIS — C8201 Follicular lymphoma grade I, lymph nodes of head, face, and neck: Secondary | ICD-10-CM | POA: Diagnosis not present

## 2016-03-05 DIAGNOSIS — C8511 Unspecified B-cell lymphoma, lymph nodes of head, face, and neck: Secondary | ICD-10-CM | POA: Diagnosis not present

## 2016-03-05 DIAGNOSIS — Z7982 Long term (current) use of aspirin: Secondary | ICD-10-CM | POA: Diagnosis not present

## 2016-03-05 DIAGNOSIS — M810 Age-related osteoporosis without current pathological fracture: Secondary | ICD-10-CM | POA: Diagnosis not present

## 2016-03-05 DIAGNOSIS — Z86010 Personal history of colonic polyps: Secondary | ICD-10-CM | POA: Diagnosis not present

## 2016-03-05 DIAGNOSIS — R221 Localized swelling, mass and lump, neck: Secondary | ICD-10-CM | POA: Diagnosis not present

## 2016-03-05 DIAGNOSIS — E785 Hyperlipidemia, unspecified: Secondary | ICD-10-CM | POA: Diagnosis not present

## 2016-03-08 ENCOUNTER — Encounter: Payer: Self-pay | Admitting: Radiation Oncology

## 2016-03-08 ENCOUNTER — Ambulatory Visit
Admission: RE | Admit: 2016-03-08 | Discharge: 2016-03-08 | Disposition: A | Discharge status: Home / Self Care | Payer: Medicare Other | Source: Ambulatory Visit | Attending: Radiation Oncology | Admitting: Radiation Oncology

## 2016-03-08 ENCOUNTER — Ambulatory Visit: Payer: Medicare Other

## 2016-03-08 VITALS — BP 136/70 | HR 85 | Resp 16 | Wt 113.2 lb

## 2016-03-08 DIAGNOSIS — C8201 Follicular lymphoma grade I, lymph nodes of head, face, and neck: Secondary | ICD-10-CM

## 2016-03-08 DIAGNOSIS — Z7982 Long term (current) use of aspirin: Secondary | ICD-10-CM | POA: Diagnosis not present

## 2016-03-08 DIAGNOSIS — M810 Age-related osteoporosis without current pathological fracture: Secondary | ICD-10-CM | POA: Diagnosis not present

## 2016-03-08 DIAGNOSIS — Z86010 Personal history of colonic polyps: Secondary | ICD-10-CM | POA: Diagnosis not present

## 2016-03-08 DIAGNOSIS — R221 Localized swelling, mass and lump, neck: Secondary | ICD-10-CM | POA: Diagnosis not present

## 2016-03-08 DIAGNOSIS — E785 Hyperlipidemia, unspecified: Secondary | ICD-10-CM | POA: Diagnosis not present

## 2016-03-08 DIAGNOSIS — C8511 Unspecified B-cell lymphoma, lymph nodes of head, face, and neck: Secondary | ICD-10-CM | POA: Diagnosis not present

## 2016-03-08 NOTE — Progress Notes (Signed)
  Radiation Oncology         [phone]   Name: [name] [name] MRN: [id number]   Date: 03/08/2016  DOB: 09-29-1934   Weekly Radiation Therapy Management    ICD-9-CM ICD-10-CM   1. Follicular lymphoma grade i, lymph nodes of head, face, and neck (HCC) 202.01 C82.01     Current Dose: 30 Gy  Planned Dose:  30 Gy  Narrative The patient presents for routine under treatment assessment.  Vitals are stable. A six pound weight loss noted. Patient denies pain at this time. She reports mouth and throat are very sore, and gum line along lower teeth is rough. Reports a metallic taste in her mouth. Reports decreased appetite and sensitive stomach. Edema of lower right cheek noted. No skin changes within treatment field noted. Reports using sonafine bid as directed. Understands to continue to use sonafine bid for the next two weeks. Reports her diet consist of jello and oatmeal. Reports her energy level has decline. Reports using magic mouthwash and carafate sporadically to manage sore throat.  Set-up films were reviewed. The chart was checked.  Physical Findings  weight is 113 lb 3.2 oz (51.3 kg). Her blood pressure is 136/70 and her pulse is 85. Her respiration is 16 and oxygen saturation is 95%. . Weight essentially stable.  No significant changes.   Impression The patient is tolerating radiation.  Plan Continue treatment as planned. One month follow up appointment card given. Understands to contact this RN with future needs.          Sheral Apley Tammi Klippel, M.D.  This document serves as a record of services personally performed by Tyler Pita, MD. It was created on his behalf by Bethann Humble, a trained medical scribe. The creation of this record is based on the scribe's personal observations and the provider's statements to them. This document has been checked and approved by the attending provider.

## 2016-03-08 NOTE — Progress Notes (Signed)
Vitals stable. Six pound weight loss noted. Denies pain. Reports mouth and throat is very sore and gum line along lower teeth rough. Reports a metallic taste in her mouth. Reports decreased appetite and sensitive stomach. Edema of lower right cheek noted. No skin changes within treatment field noted. Reports using sonafine bid as directed. Understands to continue to use sonafine bid for the next two weeks. Reports her diet consist of jello and oatmeal. Reports her energy level has decline. Reports using magic mouthwash and carafate sporadically to manage sore throat. One month follow up appointment card given. Understands to contact this RN with future needs.   BP 136/70 (BP Location: Right Arm, Patient Position: Sitting, Cuff Size: Normal)   Pulse 85   Resp 16   Wt 113 lb 3.2 oz (51.3 kg)   SpO2 95%   BMI 21.39 kg/m ' Wt Readings from Last 3 Encounters:  03/08/16 113 lb 3.2 oz (51.3 kg)  03/02/16 119 lb 1.6 oz (54 kg)  03/01/16 119 lb 6.4 oz (54.2 kg)

## 2016-03-09 ENCOUNTER — Ambulatory Visit: Payer: Medicare Other

## 2016-03-13 NOTE — Progress Notes (Signed)
  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   She had some dry mouth and taste alteration.  Plan: The patient has completed radiation treatment. The patient will return to radiation oncology clinic for routine followup in one month. I advised her to call or return sooner if she has any questions or concerns related to her recovery or treatment. ________________________________  Sheral Apley. Tammi Klippel, M.D.

## 2016-03-16 ENCOUNTER — Telehealth: Payer: Self-pay | Admitting: Hematology

## 2016-03-16 NOTE — Telephone Encounter (Signed)
Appointments scheduled per 11/14 LOS.

## 2016-03-25 DIAGNOSIS — M810 Age-related osteoporosis without current pathological fracture: Secondary | ICD-10-CM | POA: Diagnosis not present

## 2016-03-29 NOTE — Progress Notes (Signed)
[name]    HEMATOLOGY/ONCOLOGY CLINIC NOTE  Date of Service: .03/02/2016  Patient Care Team: Deland Pretty, MD as PCP - General (Internal Medicine)  CHIEF COMPLAINTS/PURPOSE OF CONSULTATION:  Newly diagnosed follicular lymphoma  HISTORY OF PRESENTING ILLNESS:   [name] [name] is a wonderful 81 y.o. female who has been referred to [place] by Dr .Horatio Pel, MD for evaluation and management of newly diagnosed follicular lymphoma.  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 Laverta Baltimore who is a retired Publishing rights manager from [place], [place].  She presented to her primary care physician with 3 months of asymptomatic fullness/swelling in her right upper neck with no overt symptoms of dental pain or swelling, fevers, chills or night sweats.  The swelling was painless and gradually growing.  She was referred to Dr. Jerrell Belfast (ENT) for further evaluation and had a CT of the neck on 12/08/2015 which showed findings "soft tissue mass measuring up to 3.2 cm which most resembles an abnormal right level 1 B lymph node. This medially displaces but seems to be separate from the adjacent right submandibular gland.There is a larger spiculated and somewhat infiltrative appearing right lateral neck mass centered at the level 2 nodal station measuring up to 4.7 cm. This lesion is inseparable from the undersurface of the right sternocleidomastoid muscle, but does not appear to invade the subjacent right carotid space.  Associated small but asymmetrically enlarged and conspicuous right level 3 lymph nodes. No primary pharyngeal or laryngeal tumor identified."  The patient subsequently had incisional biopsy of the right upper neck lymph node on 12/19/2015 which is consistent with a low-grade follicular lymphoma. Patient has been referred for  evaluation and management for follicular lymphoma. She notes no other areas of obviously enlarged lymph nodes. No constitutional symptoms such as fevers, chills, night sweats, unexpected weight loss. Overall feels well and no differently than she felt 6 months ago.  INTERVAL HISTORY  Ms Tondreau is here for her scheduled followup for [place]. She is tolerating her ISRT well with some grade 1 sore throat, sore mouth due to radiation mucositis and taste change and minimal erythema over the skin.  No fevers or chills. Last planned treatment is 03/08/2016.    MEDICAL HISTORY:  Past Medical History:  Diagnosis Date  . Cancer ([place])    follicular lymphoma  . Cataract   . History of colon polyps    all colonic mucosa  . Hyperlipidemia    on lipitor  . 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 COLONOSCOPY     [id number]  . EYE SURGERY Bilateral    cataract removal  . 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 POLYPECTOMY     2001,2006-all colonic mucosa   . SUBMANDIBULAR GLAND EXCISION Right 12/19/2015   Procedure: BIOPSY OF RIGHT SUBMANDIBULAR MASS;  Surgeon: Jerrell Belfast, MD;  Location: [facility] OR;  Service: ENT;  Laterality: Right;    SOCIAL HISTORY: Social History   Social History  . Marital status: Married    Spouse name: N/A  . Number of children: N/A  . Years of education: N/A   Occupational History  . Not on file.   Social History  Main Topics  . Smoking status: Never Smoker  . Smokeless tobacco: Never Used  . Alcohol use No  . Drug use: No  . Sexual activity: Not on file   Other Topics Concern  . Not on file   Social History Narrative  . No narrative on file    FAMILY HISTORY: Family History  Problem Relation Age of Onset  . Colon cancer Neg Hx   . Colon polyps Neg Hx   . Esophageal cancer  Neg Hx   . Rectal cancer Neg Hx   . Stomach cancer Neg Hx     ALLERGIES:  is allergic to no known allergies.  MEDICATIONS:  Current Outpatient Prescriptions  Medication Sig Dispense Refill  . aspirin 81 MG tablet Chew 81 mg by mouth daily.    . Aspirin-Calcium Carbonate 81-777 MG TABS Take by mouth.    [name] atorvastatin (LIPITOR) 20 MG tablet Take 20 mg by mouth.    . BD PEN NEEDLE NANO U/F 32G X 4 MM MISC     . Cholecalciferol (VITAMIN D) 2000 units CAPS Take 2,000 Units by mouth daily.    [name] denosumab (PROLIA) 60 MG/ML SOLN injection Inject 60 mg into the skin every 6 (six) months. Administer in upper arm, thigh, or abdomen    . FORTEO 600 MCG/2.4ML SOLN Inject 20 mcg into the skin daily.    . magic mouthwash w/lidocaine SOLN Take 5 mLs by mouth 4 (four) times daily as needed for mouth pain. 200 mL 1  . Multiple Vitamins-Minerals (MULTIVITAMIN WITH MINERALS) tablet Take 1 tablet by mouth.    . Omega-3 Fatty Acids (FISH OIL TRIPLE STRENGTH) 1400 MG CAPS Take 1 capsule by mouth daily.    . sucralfate (CARAFATE) 1 GM/10ML suspension Take 10 mLs (1 g total) by mouth 4 (four) times daily -  with meals and at bedtime. 420 mL 0  . Wound Dressings (SONAFINE EX) Apply topically.     No current facility-administered medications for this visit.     REVIEW OF SYSTEMS:    10 Point review of Systems was done is negative except as noted above.  PHYSICAL EXAMINATION: ECOG PERFORMANCE STATUS: 1 - Symptomatic but completely ambulatory  . Vitals:   03/02/16 0937  BP: (!) 154/63  Pulse: 80  Resp: 18  Temp: 98.2 F (36.8 C)   Filed Weights   03/02/16 0937  Weight: 119 lb 1.6 oz (54 kg)   .Body mass index is 22.5 kg/m.  GENERAL:alert, in no acute distress and comfortable SKIN: skin color, texture, turgor are normal, no rashes or significant lesions EYES: normal, conjunctiva are pink and non-injected, sclera clear OROPHARYNX: Mild oral erythema due to mucositis NECK: supple, no JVD,  thyroid normal size, non-tender, without nodularity LYMPH:  Right upper neck palpable lymphadenopathy reduced in size . LUNGS: clear to auscultation with normal respiratory effort HEART: regular rate & rhythm,  no murmurs and no lower extremity edema ABDOMEN: abdomen soft, non-tender, normoactive bowel sounds , no palpable hepatosplenomegaly Musculoskeletal: no cyanosis of digits and no clubbing  PSYCH: alert & oriented x 3 with fluent speech NEURO: no focal motor/sensory deficits  LABORATORY DATA:  I have reviewed the data as listed  . CBC Latest Ref Rng & Units 03/02/2016 01/13/2016 12/17/2015  WBC 3.9 - 10.3 10e3/uL 9.7 9.9 8.2  Hemoglobin 11.6 - 15.9 g/dL 14.5 15.1 14.9  Hematocrit 34.8 - 46.6 % 43.8 44.5 46.2(H)  Platelets 145 - 400 10e3/uL 242 265 257    . CMP  Latest Ref Rng & Units 03/02/2016 01/13/2016 12/17/2015  Glucose 70 - 140 mg/dl 97 125 94  BUN 7.0 - 26.0 mg/dL 11.4 13.2 11  Creatinine 0.6 - 1.1 mg/dL 0.8 0.8 0.74  Sodium 136 - 145 mEq/L 142 140 141  Potassium 3.5 - 5.1 mEq/L 4.3 3.7 4.3  Chloride 101 - 111 mmol/L - - 105  CO2 22 - 29 mEq/L '26 24 28  ' Calcium 8.4 - 10.4 mg/dL 10.6(H) 11.5(H) 10.2  Total Protein 6.4 - 8.3 g/dL 7.5 7.6 -  Total Bilirubin 0.20 - 1.20 mg/dL 0.69 0.51 -  Alkaline Phos 40 - 150 U/L 73 66 -  AST 5 - 34 U/L 24 23 -  ALT 0 - 55 U/L 18 19 -   . Lab Results  Component Value Date   LDH 181 03/02/2016        RADIOGRAPHIC STUDIES: I have personally reviewed the radiological images as listed and agreed with the findings in the report. No results found.  ASSESSMENT & PLAN:   81 year old very pleasant Caucasian lady with  #1 Newly diagnosed low-grade follicular lymphoma ( grade 1-2/3) presenting with right upper neck lymphadenopathy. Patient imaging shows that this is at least a stage IIIA. No overt evidence of bone marrow involvement but bone marrow biopsy is not planned for additional staging based on patient preference and the fact  that it will not change treatment at this time . LDH level within normal limits FLIPI score - intermediate risk with 5 year OS 78% and ten-year overall survival 51% (from data in the pre-rituxan era)  #2 hypercalcemia - likely related to the lymphoma. Asymptomatic at this time with no renal insufficiency. No overt bone involvement with lymphoma.  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 -Sucralfate 3 times a day and when necessary Magic mouthwash for radiation esophagitis related odynophagia. -Called oral calcium replacement at this time. -The rest of her disease is not life for organ threatening at this time and asymptomatic and she does not have any constitutional symptoms suggesting need for systemic therapies at this time.  Return to care with Dr. Irene Limbo in 3 months with CBC, CMP and LDH  And PET/CT Earlier if any acute new concerns.  . Orders Placed This Encounter  Procedures  . NM PET Image Restag (PS) Skull Base To Thigh    Standing Status:   Future    Standing Expiration Date:   03/02/2017    Order Specific Question:   Reason for Exam (SYMPTOM  OR DIAGNOSIS REQUIRED)    Answer:   re-evaluation for follicular lymphoma post RT for treatment strategy planning    Order Specific Question:   Preferred imaging location?    Answer:   [facility]    Order Specific Question:   If indicated for the ordered procedure, I authorize the administration of a radiopharmaceutical per Radiology protocol    Answer:   Yes  . CBC & Diff and Retic    Standing Status:   Future    Standing Expiration Date:   03/02/2017  . Comprehensive metabolic panel    Standing Status:   Future    Standing Expiration Date:   03/02/2017  . Lactate dehydrogenase    Standing Status:   Future    Standing Expiration Date:   03/02/2017    All of the patients and her husbands questions  were answered  to their apparent satisfaction. The patient knows to call the clinic with any problems, questions or concerns.  I spent 20 minutes counseling the patient face to face. The total time spent in the appointment was 25 minutes and more than 50% was on counseling  regarding treatment options and the rationale prognosis and direct patient cares.    Sullivan Lone MD [place] AAHIVMS [facility] Hematology/Oncology Physician [facility]  (Office):       [phone] (Work cell):  [phone] (Fax):           [phone]

## 2016-04-15 NOTE — Progress Notes (Addendum)
Mrs. [name] [name]. 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  Skin changes: Skin looks good to right side of neck normal color. Had been using Radiaplex gel to her neck. Pain: None Fever:None Appetite: Good, food does not taste good, taste is coming back. Night sweats:None Weight: Wt Readings from Last 3 Encounters:  04/20/16 116 lb 12.8 oz (53 kg)  03/08/16 113 lb 3.2 oz (51.3 kg)  03/02/16 119 lb 1.6 oz (54 kg)  Gained 3 pounds BP (!) 156/72   Pulse 80   Temp 98.4 F (36.9 C) (Oral)   Resp 18   Ht '5\' 1"'$  (1.549 m)   Wt 116 lb 12.8 oz (53 kg)   SpO2 99%   BMI 22.07 kg/m

## 2016-04-20 ENCOUNTER — Other Ambulatory Visit: Payer: Self-pay | Admitting: *Deleted

## 2016-04-20 ENCOUNTER — Ambulatory Visit
Admission: RE | Admit: 2016-04-20 | Discharge: 2016-04-20 | Disposition: A | Discharge status: Home / Self Care | Payer: Medicare Other | Source: Ambulatory Visit | Attending: Radiation Oncology | Admitting: Radiation Oncology

## 2016-04-20 ENCOUNTER — Encounter: Payer: Self-pay | Admitting: Radiation Oncology

## 2016-04-20 VITALS — BP 156/72 | HR 80 | Temp 98.4°F | Resp 18 | Ht 61.0 in | Wt 116.8 lb

## 2016-04-20 DIAGNOSIS — Z7982 Long term (current) use of aspirin: Secondary | ICD-10-CM | POA: Insufficient documentation

## 2016-04-20 DIAGNOSIS — C8511 Unspecified B-cell lymphoma, lymph nodes of head, face, and neck: Secondary | ICD-10-CM | POA: Diagnosis not present

## 2016-04-20 DIAGNOSIS — C8201 Follicular lymphoma grade I, lymph nodes of head, face, and neck: Secondary | ICD-10-CM

## 2016-04-22 NOTE — Progress Notes (Signed)
Radiation Oncology         [phone] ________________________________  Name: [name] [name] MRN: [id number]  Date: 04/20/2016  DOB: [birth year]  Post Treatment Note  CC: Horatio Pel, MD  Deland Pretty, MD  Diagnosis:  Stage IIIA, non-Hodgkin B-cell lymphoma with disease in the lymphatics of the right neck.  Interval Since Last Radiation:  6 weeks   02/17/16-03/08/16: The right neck was treated to 30 Gy in 10 fractions  Narrative:  The patient returns today for routine follow-up. The patient tolerated radiotherapy quite well with mild alterations in her taste, and dry mouth.  She has plans to be seen by Dr. Irene Limbo in February of this year. This will continue her surveillance.                       On review of systems, the patient states she is doing very well. Her taste and dry mouth has improved. She reports her skin is intact without any evidence of breakdown. She denies any difficulty with dysphagia at this time and is eating regular food. She is hopeful to continue to increase her oral intake and to gain back some of the weight she has lost. No other complaints or verbalized.  ALLERGIES:  is allergic to no known allergies.  Meds: Current Outpatient Prescriptions  Medication Sig Dispense Refill  . aspirin 81 MG tablet Chew 81 mg by mouth daily.    [name] atorvastatin (LIPITOR) 20 MG tablet Take 20 mg by mouth.    . BD PEN NEEDLE NANO U/F 32G X 4 MM MISC     . Cholecalciferol (VITAMIN D) 2000 units CAPS Take 2,000 Units by mouth daily.    [name] denosumab (PROLIA) 60 MG/ML SOLN injection Inject 60 mg into the skin every 6 (six) months. Administer in upper arm, thigh, or abdomen    . FORTEO 600 MCG/2.4ML SOLN Inject 20 mcg into the skin daily.    . Multiple Vitamins-Minerals (MULTIVITAMIN WITH MINERALS) tablet Take 1 tablet by mouth.    . Omega-3 Fatty Acids (FISH OIL TRIPLE STRENGTH) 1400 MG CAPS Take 1 capsule by mouth daily.    . Aspirin-Calcium Carbonate 81-777 MG TABS Take by  mouth.    . magic mouthwash w/lidocaine SOLN Take 5 mLs by mouth 4 (four) times daily as needed for mouth pain. (Patient not taking: Reported on 04/20/2016) 200 mL 1  . sucralfate (CARAFATE) 1 GM/10ML suspension Take 10 mLs (1 g total) by mouth 4 (four) times daily -  with meals and at bedtime. (Patient not taking: Reported on 04/20/2016) 420 mL 0   No current facility-administered medications for this encounter.     Physical Findings:  height is '5\' 1"'$  (1.549 m) and weight is 116 lb 12.8 oz (53 kg). Her oral temperature is 98.4 F (36.9 C). Her blood pressure is 156/72 (abnormal) and her pulse is 80. Her respiration is 18 and oxygen saturation is 99%.  In general this is a well appearing Caucasian female in no acute distress. She's alert and oriented x4 and appropriate throughout the examination. Cardiopulmonary assessment is negative for acute distress and she exhibits normal effort.   Lab Findings: Lab Results  Component Value Date   WBC 9.7 03/02/2016   HGB 14.5 03/02/2016   HCT 43.8 03/02/2016   MCV 90.3 03/02/2016   PLT 242 03/02/2016     Radiographic Findings: No results found.  Impression/Plan: 1. Stage IIIA, non-Hodgkin B-cell lymphoma with disease in the lymphatics of  the right neck. The patient is doing very well since completing radiotherapy. We will continue to follow her as needed moving forward, she will keep [place] informed any questions or concerns regarding her previous radiotherapy, she will also continue follow-up under the care of her medical oncologist Dr. Irene Limbo.      Carola Rhine, PAC

## 2016-04-25 ENCOUNTER — Telehealth: Payer: Self-pay | Admitting: Hematology

## 2016-04-25 NOTE — Telephone Encounter (Signed)
Call pt to r/s 2/16 md appt per staff message. Reached pt's vm. Lvm asking her to call us to r/s.

## 2016-06-02 ENCOUNTER — Other Ambulatory Visit: Payer: Medicare Other

## 2016-06-04 ENCOUNTER — Ambulatory Visit: Payer: Medicare Other | Admitting: Hematology

## 2016-06-09 ENCOUNTER — Ambulatory Visit (HOSPITAL_COMMUNITY)
Admission: RE | Admit: 2016-06-09 | Discharge: 2016-06-09 | Disposition: A | Discharge status: Home / Self Care | Payer: Medicare Other | Source: Ambulatory Visit | Attending: Hematology | Admitting: Hematology

## 2016-06-09 ENCOUNTER — Other Ambulatory Visit (HOSPITAL_BASED_OUTPATIENT_CLINIC_OR_DEPARTMENT_OTHER): Payer: Medicare Other

## 2016-06-09 DIAGNOSIS — Z923 Personal history of irradiation: Secondary | ICD-10-CM | POA: Insufficient documentation

## 2016-06-09 DIAGNOSIS — I700 Atherosclerosis of aorta: Secondary | ICD-10-CM | POA: Diagnosis not present

## 2016-06-09 DIAGNOSIS — C8201 Follicular lymphoma grade I, lymph nodes of head, face, and neck: Secondary | ICD-10-CM | POA: Insufficient documentation

## 2016-06-09 DIAGNOSIS — R188 Other ascites: Secondary | ICD-10-CM | POA: Insufficient documentation

## 2016-06-09 DIAGNOSIS — C8221 Follicular lymphoma grade III, unspecified, lymph nodes of head, face, and neck: Secondary | ICD-10-CM | POA: Diagnosis not present

## 2016-06-09 DIAGNOSIS — K449 Diaphragmatic hernia without obstruction or gangrene: Secondary | ICD-10-CM | POA: Insufficient documentation

## 2016-06-09 DIAGNOSIS — R591 Generalized enlarged lymph nodes: Secondary | ICD-10-CM | POA: Insufficient documentation

## 2016-06-09 LAB — CBC & DIFF AND RETIC
BASO%: 0.4 % (ref 0.0–2.0)
Basophils Absolute: 0 10*3/uL (ref 0.0–0.1)
EOS%: 2.1 % (ref 0.0–7.0)
Eosinophils Absolute: 0.2 10*3/uL (ref 0.0–0.5)
HCT: 42.4 % (ref 34.8–46.6)
HGB: 14 g/dL (ref 11.6–15.9)
IMMATURE RETIC FRACT: 1.1 % — AB (ref 1.60–10.00)
LYMPH#: 2.2 10*3/uL (ref 0.9–3.3)
LYMPH%: 28.9 % (ref 14.0–49.7)
MCH: 29.7 pg (ref 25.1–34.0)
MCHC: 33 g/dL (ref 31.5–36.0)
MCV: 89.8 fL (ref 79.5–101.0)
MONO#: 0.6 10*3/uL (ref 0.1–0.9)
MONO%: 7.9 % (ref 0.0–14.0)
NEUT%: 60.7 % (ref 38.4–76.8)
NEUTROS ABS: 4.7 10*3/uL (ref 1.5–6.5)
PLATELETS: 234 10*3/uL (ref 145–400)
RBC: 4.72 10*6/uL (ref 3.70–5.45)
RDW: 14.4 % (ref 11.2–14.5)
RETIC %: 1.13 % (ref 0.70–2.10)
Retic Ct Abs: 53.34 10*3/uL (ref 33.70–90.70)
WBC: 7.7 10*3/uL (ref 3.9–10.3)

## 2016-06-09 LAB — COMPREHENSIVE METABOLIC PANEL
ALT: 15 U/L (ref 0–55)
ANION GAP: 7 meq/L (ref 3–11)
AST: 21 U/L (ref 5–34)
Albumin: 3.9 g/dL (ref 3.5–5.0)
Alkaline Phosphatase: 59 U/L (ref 40–150)
BUN: 19.4 mg/dL (ref 7.0–26.0)
CHLORIDE: 107 meq/L (ref 98–109)
CO2: 29 meq/L (ref 22–29)
Calcium: 10.2 mg/dL (ref 8.4–10.4)
Creatinine: 0.8 mg/dL (ref 0.6–1.1)
EGFR: 73 mL/min/{1.73_m2} — ABNORMAL LOW (ref >90)
GLUCOSE: 93 mg/dL (ref 70–140)
Potassium: 5.1 mEq/L (ref 3.5–5.1)
SODIUM: 143 meq/L (ref 136–145)
Total Bilirubin: 0.59 mg/dL (ref 0.20–1.20)
Total Protein: 6.9 g/dL (ref 6.4–8.3)

## 2016-06-09 LAB — LACTATE DEHYDROGENASE: LDH: 170 U/L (ref 125–245)

## 2016-06-09 LAB — GLUCOSE, CAPILLARY: GLUCOSE-CAPILLARY: 81 mg/dL (ref 65–99)

## 2016-06-09 IMAGING — CT NM PET TUM IMG RESTAG (PS) SKULL BASE T - THIGH
1 of 9 series · 1 of 25 positions shown · non-contrast
Comparison: 01/09/2016 PET-CT.

CLINICAL DATA: Subsequent treatment strategy for low-grade stage
IIIA follicular lymphoma status post radiation therapy to the right
neck completed 03/08/2016. Remote history of resected pulmonary
carcinoid in 6669.

EXAM:
NUCLEAR MEDICINE PET SKULL BASE TO THIGH
TECHNIQUE: 5.7 mCi F-18 FDG was injected intravenously. Full-ring PET imaging
was performed from the skull base to thigh after the radiotracer. CT
data was obtained and used for attenuation correction and anatomic
localization.
FASTING BLOOD GLUCOSE:  Value: 81 mg/dl

[Series 4: ct sk_thigh 5.0 b31f · axial · 5.0mm · 0.98mm/px · 1 of 200 slices shown]
[im 200/200  brain]
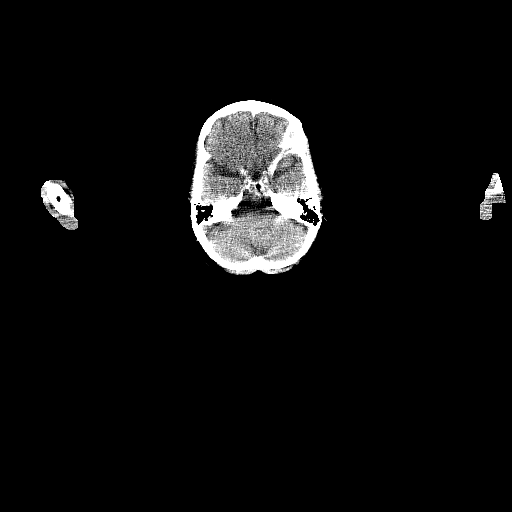

[1 of 25 positions shown; findings below may reference images not displayed]

FINDINGS: NECK

No enlarged or hypermetabolic lymph nodes in the neck. The
previously described enlarged hypermetabolic 2.7 cm right level 1
neck lymph node and 2.1 cm right level 2 neck lymph node are not
discretely visualized on today's scan.

CHEST

Top-normal heart size. Atherosclerotic nonaneurysmal thoracic aorta.
No enlarged or hypermetabolic axillary, mediastinal or hilar lymph
nodes. The previously described focus of hypermetabolism anterior to
the descending thoracic aorta in the left posterior mediastinum on
the 01/09/2016 PET-CT study is favored represent nonspecific
esophageal hypermetabolism with max SUV 4.7 on today's scan,
decreased from previous max SUV 5.7, with no discrete lymph node in
this location on the CT images. No pleural effusions. No
pneumothorax. Status post right middle lobectomy. Stable 4 mm
anterior left lower lobe solid pulmonary nodule (series 8/image 34),
below PET resolution, probably benign. No acute consolidative
airspace disease, lung masses or new significant pulmonary nodules.

ABDOMEN/PELVIS

Small volume ascites, predominantly in the dependent deep pelvis,
stable.

Hypermetabolic enlarged 1.6 cm left mesenteric lymph node with max
SUV 4.8 (series 4/image 126), previously 1.5 cm with max SUV 4.6,
not appreciably changed in size or metabolism.

Mildly enlarged 1.2 cm right inguinal lymph node with max SUV
(series 4/image 165), previously 1.3 cm with max SUV 5.6, not
appreciably changed in size or metabolism.

Nonenlarged non hypermetabolic 0.6 cm left internal iliac lymph node
with max SUV 2.9 (series 4/image 141), previously 0.6 cm with max
SUV 4.5, stable in size and decreased in metabolism.

No additional hypermetabolic or enlarged abdominopelvic lymph nodes.

No abnormal hypermetabolic activity within the liver, pancreas,
adrenal glands, or spleen. Normal size spleen. Small hiatal hernia.
Suggestion of a punctate nonobstructing 1 mm lower left renal stone.
No hydronephrosis. Atherosclerotic nonaneurysmal abdominal aorta.

SKELETON

No focal hypermetabolic activity to suggest skeletal metastasis.
IMPRESSION: 1. Complete metabolic response in the treated right neck lymph
nodes.
2. Hypermetabolic left mesenteric and right inguinal
lymphadenopathy, stable, compatible with lymphoma.
3. No new or progressive sites of lymphoma.
4. Stable small volume ascites.
5. Additional findings include aortic atherosclerosis and small
hiatal hernia.

## 2016-06-09 MED ORDER — FLUDEOXYGLUCOSE F - 18 (FDG) INJECTION
5.7000 | Freq: Once | INTRAVENOUS | Status: AC | PRN
  Administered 2016-06-09: 5.7 via INTRAVENOUS

## 2016-06-10 DIAGNOSIS — Z1231 Encounter for screening mammogram for malignant neoplasm of breast: Secondary | ICD-10-CM | POA: Diagnosis not present

## 2016-06-10 DIAGNOSIS — L821 Other seborrheic keratosis: Secondary | ICD-10-CM | POA: Diagnosis not present

## 2016-06-10 DIAGNOSIS — L570 Actinic keratosis: Secondary | ICD-10-CM | POA: Diagnosis not present

## 2016-06-10 DIAGNOSIS — D1801 Hemangioma of skin and subcutaneous tissue: Secondary | ICD-10-CM | POA: Diagnosis not present

## 2016-06-10 DIAGNOSIS — D485 Neoplasm of uncertain behavior of skin: Secondary | ICD-10-CM | POA: Diagnosis not present

## 2016-06-10 DIAGNOSIS — L812 Freckles: Secondary | ICD-10-CM | POA: Diagnosis not present

## 2016-06-10 DIAGNOSIS — C44311 Basal cell carcinoma of skin of nose: Secondary | ICD-10-CM | POA: Diagnosis not present

## 2016-06-10 DIAGNOSIS — L820 Inflamed seborrheic keratosis: Secondary | ICD-10-CM | POA: Diagnosis not present

## 2016-06-16 ENCOUNTER — Encounter: Payer: Self-pay | Admitting: Hematology

## 2016-06-16 ENCOUNTER — Ambulatory Visit: Payer: Medicare Other

## 2016-06-16 ENCOUNTER — Telehealth: Payer: Self-pay | Admitting: Hematology

## 2016-06-16 ENCOUNTER — Ambulatory Visit (HOSPITAL_BASED_OUTPATIENT_CLINIC_OR_DEPARTMENT_OTHER): Payer: Medicare Other | Admitting: Hematology

## 2016-06-16 VITALS — BP 162/60 | HR 77 | Temp 98.1°F | Resp 18 | Wt 114.6 lb

## 2016-06-16 DIAGNOSIS — C8201 Follicular lymphoma grade I, lymph nodes of head, face, and neck: Secondary | ICD-10-CM

## 2016-06-16 DIAGNOSIS — M818 Other osteoporosis without current pathological fracture: Secondary | ICD-10-CM

## 2016-06-16 NOTE — Telephone Encounter (Signed)
Gave patient AVS and calendar for May appt per 06/16/2016 los

## 2016-06-19 NOTE — Progress Notes (Signed)
[name]    HEMATOLOGY/ONCOLOGY CLINIC NOTE  Date of Service: .06/16/2016  Patient Care Team: Deland Pretty, MD as PCP - General (Internal Medicine)  CHIEF COMPLAINTS/PURPOSE OF CONSULTATION:  Newly diagnosed follicular lymphoma  HISTORY OF PRESENTING ILLNESS:   [name] [name] is a wonderful 82 y.o. female who has been referred to [place] by Dr .Horatio Pel, MD for evaluation and management of newly diagnosed follicular lymphoma.  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 Laverta Baltimore who is a retired Publishing rights manager from [place], [place].  She presented to her primary care physician with 3 months of asymptomatic fullness/swelling in her right upper neck with no overt symptoms of dental pain or swelling, fevers, chills or night sweats.  The swelling was painless and gradually growing.  She was referred to Dr. Jerrell Belfast (ENT) for further evaluation and had a CT of the neck on 12/08/2015 which showed findings "soft tissue mass measuring up to 3.2 cm which most resembles an abnormal right level 1 B lymph node. This medially displaces but seems to be separate from the adjacent right submandibular gland.There is a larger spiculated and somewhat infiltrative appearing right lateral neck mass centered at the level 2 nodal station measuring up to 4.7 cm. This lesion is inseparable from the undersurface of the right sternocleidomastoid muscle, but does not appear to invade the subjacent right carotid space.  Associated small but asymmetrically enlarged and conspicuous right level 3 lymph nodes. No primary pharyngeal or laryngeal tumor identified."  The patient subsequently had incisional biopsy of the right upper neck lymph node on 12/19/2015 which is consistent with a low-grade follicular lymphoma. Patient has been referred for  evaluation and management for follicular lymphoma. She notes no other areas of obviously enlarged lymph nodes. No constitutional symptoms such as fevers, chills, night sweats, unexpected weight loss. Overall feels well and no differently than she felt 6 months ago.  INTERVAL HISTORY  [name] [name] is here for her scheduled followup forfollicular lymphoma. Skin inflammation from ISRT has resolved. Cervical LNadnopathy has completely resolved with ISRT. PET/CT shows no progression of her other disease sites and is stable. [name] [name] has no constitutional symptoms and has no new concerning focal symptoms.  MEDICAL HISTORY:  Past Medical History:  Diagnosis Date  . Cancer ([place])    follicular lymphoma  . Cataract   . History of colon polyps    all colonic mucosa  . Hyperlipidemia    on lipitor  . 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 COLONOSCOPY     [id number]  . EYE SURGERY Bilateral    cataract removal  . 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 POLYPECTOMY     2001,2006-all colonic mucosa   . SUBMANDIBULAR GLAND EXCISION Right 12/19/2015   Procedure: BIOPSY OF RIGHT SUBMANDIBULAR MASS;  Surgeon: Jerrell Belfast, MD;  Location: [facility] OR;  Service: ENT;  Laterality: Right;    SOCIAL HISTORY: Social History   Social History  . Marital status: Married    Spouse name: N/A  . Number of children: N/A  . Years of education: N/A   Occupational History  . Not on file.   Social History Main  Topics  . Smoking status: Never Smoker  . Smokeless tobacco: Never Used  . Alcohol use No  . Drug use: No  . Sexual activity: Not on file   Other Topics Concern  . Not on file   Social History Narrative  . No narrative on file    FAMILY HISTORY: Family History  Problem Relation Age of Onset  . Colon cancer Neg Hx    . Colon polyps Neg Hx   . Esophageal cancer Neg Hx   . Rectal cancer Neg Hx   . Stomach cancer Neg Hx     ALLERGIES:  is allergic to no known allergies.  MEDICATIONS:  Current Outpatient Prescriptions  Medication Sig Dispense Refill  . aspirin 81 MG tablet Chew 81 mg by mouth daily.    [name] atorvastatin (LIPITOR) 20 MG tablet Take 20 mg by mouth.    . BD PEN NEEDLE NANO U/F 32G X 4 MM MISC     . Cholecalciferol (VITAMIN D) 2000 units CAPS Take 2,000 Units by mouth daily.    [name] denosumab (PROLIA) 60 MG/ML SOLN injection Inject 60 mg into the skin every 6 (six) months. Administer in upper arm, thigh, or abdomen    . FORTEO 600 MCG/2.4ML SOLN Inject 20 mcg into the skin daily.    . Multiple Vitamins-Minerals (MULTIVITAMIN WITH MINERALS) tablet Take 1 tablet by mouth.    . Omega-3 Fatty Acids (FISH OIL TRIPLE STRENGTH) 1400 MG CAPS Take 1 capsule by mouth daily.    . magic mouthwash w/lidocaine SOLN Take 5 mLs by mouth 4 (four) times daily as needed for mouth pain. (Patient not taking: Reported on 04/20/2016) 200 mL 1  . sucralfate (CARAFATE) 1 GM/10ML suspension Take 10 mLs (1 g total) by mouth 4 (four) times daily -  with meals and at bedtime. (Patient not taking: Reported on 04/20/2016) 420 mL 0   No current facility-administered medications for this visit.     REVIEW OF SYSTEMS:    10 Point review of Systems was done is negative except as noted above.  PHYSICAL EXAMINATION: ECOG PERFORMANCE STATUS: 1 - Symptomatic but completely ambulatory  . Vitals:   06/16/16 1335  BP: (!) 162/60  Pulse: 77  Resp: 18  Temp: 98.1 F (36.7 C)   Filed Weights   06/16/16 1335  Weight: 114 lb 9.6 oz (52 kg)   .Body mass index is 21.65 kg/m.  GENERAL:alert, in no acute distress and comfortable SKIN: skin color, texture, turgor are normal, no rashes or significant lesions EYES: normal, conjunctiva are pink and non-injected, sclera clear OROPHARYNX: MMM NECK: supple, no JVD, thyroid normal  size, non-tender, without nodularity LYMPH:  No palpable cervical or axillary LNadenoapathy. LUNGS: clear to auscultation with normal respiratory effort HEART: regular rate & rhythm,  no murmurs and no lower extremity edema ABDOMEN: abdomen soft, non-tender, normoactive bowel sounds , no palpable hepatosplenomegaly Musculoskeletal: no cyanosis of digits and no clubbing  PSYCH: alert & oriented x 3 with fluent speech NEURO: no focal motor/sensory deficits  LABORATORY DATA:  I have reviewed the data as listed  . CBC Latest Ref Rng & Units 06/09/2016 03/02/2016 01/13/2016  WBC 3.9 - 10.3 10e3/uL 7.7 9.7 9.9  Hemoglobin 11.6 - 15.9 g/dL 14.0 14.5 15.1  Hematocrit 34.8 - 46.6 % 42.4 43.8 44.5  Platelets 145 - 400 10e3/uL 234 242 265    . CMP Latest Ref Rng & Units 06/09/2016 03/02/2016 01/13/2016  Glucose 70 - 140 mg/dl 93 97 125  BUN  7.0 - 26.0 mg/dL 19.4 11.4 13.2  Creatinine 0.6 - 1.1 mg/dL 0.8 0.8 0.8  Sodium 136 - 145 mEq/L 143 142 140  Potassium 3.5 - 5.1 mEq/L 5.1 4.3 3.7  Chloride 101 - 111 mmol/L - - -  CO2 22 - 29 mEq/L '29 26 24  ' Calcium 8.4 - 10.4 mg/dL 10.2 10.6(H) 11.5(H)  Total Protein 6.4 - 8.3 g/dL 6.9 7.5 7.6  Total Bilirubin 0.20 - 1.20 mg/dL 0.59 0.69 0.51  Alkaline Phos 40 - 150 U/L 59 73 66  AST 5 - 34 U/L '21 24 23  ' ALT 0 - 55 U/L '15 18 19   ' . Lab Results  Component Value Date   LDH 170 06/09/2016        RADIOGRAPHIC STUDIES: I have personally reviewed the radiological images as listed and agreed with the findings in the report. Nm Pet Image Restag (ps) Skull Base To Thigh  Result Date: 06/09/2016 CLINICAL DATA:  Subsequent treatment strategy for low-grade stage IIIA follicular lymphoma status post radiation therapy to the right neck completed 03/08/2016. Remote history of resected pulmonary carcinoid in 2008. EXAM: NUCLEAR MEDICINE PET SKULL BASE TO THIGH TECHNIQUE: 5.7 mCi F-18 FDG was injected intravenously. Full-ring PET imaging was performed from the  skull base to thigh after the radiotracer. CT data was obtained and used for attenuation correction and anatomic localization. FASTING BLOOD GLUCOSE:  Value: 81 mg/dl COMPARISON:  01/09/2016 PET-CT. FINDINGS: NECK No enlarged or hypermetabolic lymph nodes in the neck. The previously described enlarged hypermetabolic 2.7 cm right level 1 neck lymph node and 2.1 cm right level 2 neck lymph node are not discretely visualized on today's scan. CHEST Top-normal heart size. Atherosclerotic nonaneurysmal thoracic aorta. No enlarged or hypermetabolic axillary, mediastinal or hilar lymph nodes. The previously described focus of hypermetabolism anterior to the descending thoracic aorta in the left posterior mediastinum on the 01/09/2016 PET-CT study is favored represent nonspecific esophageal hypermetabolism with max SUV 4.7 on today's scan, decreased from previous max SUV 5.7, with no discrete lymph node in this location on the CT images. No pleural effusions. No pneumothorax. Status post right middle lobectomy. Stable 4 mm anterior left lower lobe solid pulmonary nodule (series 8/image 34), below PET resolution, probably benign. No acute consolidative airspace disease, lung masses or new significant pulmonary nodules. ABDOMEN/PELVIS Small volume ascites, predominantly in the dependent deep pelvis, stable. Hypermetabolic enlarged 1.6 cm left mesenteric lymph node with max SUV 4.8 (series 4/image 126), previously 1.5 cm with max SUV 4.6, not appreciably changed in size or metabolism. Mildly enlarged 1.2 cm right inguinal lymph node with max SUV 5.9 (series 4/image 165), previously 1.3 cm with max SUV 5.6, not appreciably changed in size or metabolism. Nonenlarged non hypermetabolic 0.6 cm left internal iliac lymph node with max SUV 2.9 (series 4/image 141), previously 0.6 cm with max SUV 4.5, stable in size and decreased in metabolism. No additional hypermetabolic or enlarged abdominopelvic lymph nodes. No abnormal  hypermetabolic activity within the liver, pancreas, adrenal glands, or spleen. Normal size spleen. Small hiatal hernia. Suggestion of a punctate nonobstructing 1 mm lower left renal stone. No hydronephrosis. Atherosclerotic nonaneurysmal abdominal aorta. SKELETON No focal hypermetabolic activity to suggest skeletal metastasis. IMPRESSION: 1. Complete metabolic response in the treated right neck lymph nodes. 2. Hypermetabolic left mesenteric and right inguinal lymphadenopathy, stable, compatible with lymphoma. 3. No new or progressive sites of lymphoma. 4. Stable small volume ascites. 5. Additional findings include aortic atherosclerosis and small hiatal hernia. Electronically Signed  By: Ilona Sorrel M.D.   On: 06/09/2016 14:36    ASSESSMENT & PLAN:   82 year old very pleasant Caucasian lady with  #1 Newly diagnosed low-grade follicular lymphoma ( grade 1-2/3) presenting with right upper neck lymphadenopathy. Patient imaging shows that this is at least a stage IIIA. No overt evidence of bone marrow involvement but bone marrow biopsy is not planned for additional staging based on patient preference and the fact that it will not change treatment at this time . LDH level within normal limits FLIPI score - intermediate risk with 5 year OS 78% and ten-year overall survival 51% (from data in the pre-rituxan era) S/p ISRT to cervical LN  Disease. PET/CT resolution of cervical disease and no other new or progressive disease. . Lab Results  Component Value Date   LDH 170 06/09/2016   LDH WNL  #2 hypercalcemia - likely related to the lymphoma. Asymptomatic at this time with no renal insufficiency. No overt bone involvement with lymphoma. Resolved with treatment of prominent cervical disease.  #3 grade 1-2 oral mucositis due to radiation - resolved Plan -Patient has no clinical, lab or radiographic evidence of disease progression at this time. -no strong indication for systemic therapies for the  patients follicular lymphoma at this time. -encourage healthy and active lifestyle. - we will f/u the patient with rpt H&P and  Labs in 3 months and rpt imaging in 6 months  Return to care with Dr. Irene Limbo in 3 months with CBC, CMP and LDH  Earlier if any acute new concerns.  . Orders Placed This Encounter  Procedures  . CBC & Diff and Retic    Standing Status:   Future    Standing Expiration Date:   06/16/2017  . Comprehensive metabolic panel    Standing Status:   Future    Standing Expiration Date:   06/16/2017  . Lactate dehydrogenase    Standing Status:   Future    Standing Expiration Date:   06/16/2017    All of the patients and her husbands questions were answered to their apparent satisfaction. The patient knows to call the clinic with any problems, questions or concerns.  I spent 20 minutes counseling the patient face to face. The total time spent in the appointment was 25 minutes and more than 50% was on counseling  regarding treatment options and the rationale prognosis and direct patient cares.    Sullivan Lone MD [place] AAHIVMS [facility] Hematology/Oncology Physician [facility]  (Office):       [phone] (Work cell):  [phone] (Fax):           [phone]

## 2016-06-23 DIAGNOSIS — C44311 Basal cell carcinoma of skin of nose: Secondary | ICD-10-CM | POA: Diagnosis not present

## 2016-06-23 DIAGNOSIS — Z85828 Personal history of other malignant neoplasm of skin: Secondary | ICD-10-CM | POA: Diagnosis not present

## 2016-09-07 ENCOUNTER — Telehealth: Payer: Self-pay | Admitting: Hematology

## 2016-09-07 NOTE — Telephone Encounter (Signed)
Spoke with patient about moving her to 5/31 and she was going out of town for 2 weeks and ask if there was anything the week before I gave her 5/23 at 9:15 and 10:00

## 2016-09-08 ENCOUNTER — Other Ambulatory Visit (HOSPITAL_BASED_OUTPATIENT_CLINIC_OR_DEPARTMENT_OTHER): Payer: Medicare Other

## 2016-09-08 ENCOUNTER — Telehealth: Payer: Self-pay | Admitting: Hematology

## 2016-09-08 ENCOUNTER — Ambulatory Visit (HOSPITAL_BASED_OUTPATIENT_CLINIC_OR_DEPARTMENT_OTHER): Payer: Medicare Other | Admitting: Hematology

## 2016-09-08 ENCOUNTER — Encounter: Payer: Self-pay | Admitting: Hematology

## 2016-09-08 VITALS — BP 152/65 | HR 73 | Temp 98.7°F | Resp 18 | Ht 61.0 in | Wt 109.9 lb

## 2016-09-08 DIAGNOSIS — C8201 Follicular lymphoma grade I, lymph nodes of head, face, and neck: Secondary | ICD-10-CM

## 2016-09-08 LAB — COMPREHENSIVE METABOLIC PANEL
ALT: 18 U/L (ref 0–55)
AST: 22 U/L (ref 5–34)
Albumin: 3.9 g/dL (ref 3.5–5.0)
Alkaline Phosphatase: 61 U/L (ref 40–150)
Anion Gap: 8 mEq/L (ref 3–11)
BUN: 15.7 mg/dL (ref 7.0–26.0)
CHLORIDE: 106 meq/L (ref 98–109)
CO2: 28 mEq/L (ref 22–29)
Calcium: 10.3 mg/dL (ref 8.4–10.4)
Creatinine: 0.8 mg/dL (ref 0.6–1.1)
EGFR: 72 mL/min/{1.73_m2} — ABNORMAL LOW (ref >90)
GLUCOSE: 91 mg/dL (ref 70–140)
POTASSIUM: 4.8 meq/L (ref 3.5–5.1)
SODIUM: 142 meq/L (ref 136–145)
Total Bilirubin: 0.48 mg/dL (ref 0.20–1.20)
Total Protein: 6.7 g/dL (ref 6.4–8.3)

## 2016-09-08 LAB — CBC & DIFF AND RETIC
BASO%: 0.5 % (ref 0.0–2.0)
Basophils Absolute: 0 10*3/uL (ref 0.0–0.1)
EOS ABS: 0.1 10*3/uL (ref 0.0–0.5)
EOS%: 1.4 % (ref 0.0–7.0)
HCT: 42.6 % (ref 34.8–46.6)
HEMOGLOBIN: 13.9 g/dL (ref 11.6–15.9)
Immature Retic Fract: 2.4 % (ref 1.60–10.00)
LYMPH%: 21.3 % (ref 14.0–49.7)
MCH: 29.3 pg (ref 25.1–34.0)
MCHC: 32.6 g/dL (ref 31.5–36.0)
MCV: 89.9 fL (ref 79.5–101.0)
MONO#: 0.7 10*3/uL (ref 0.1–0.9)
MONO%: 8.3 % (ref 0.0–14.0)
NEUT#: 6 10*3/uL (ref 1.5–6.5)
NEUT%: 68.5 % (ref 38.4–76.8)
Platelets: 276 10*3/uL (ref 145–400)
RBC: 4.74 10*6/uL (ref 3.70–5.45)
RDW: 14.7 % — AB (ref 11.2–14.5)
RETIC %: 1.24 % (ref 0.70–2.10)
Retic Ct Abs: 58.78 10*3/uL (ref 33.70–90.70)
WBC: 8.7 10*3/uL (ref 3.9–10.3)
lymph#: 1.9 10*3/uL (ref 0.9–3.3)

## 2016-09-08 LAB — LACTATE DEHYDROGENASE: LDH: 163 U/L (ref 125–245)

## 2016-09-08 NOTE — Telephone Encounter (Signed)
A;pppointments scheduled per, 09/08/16 los. Date and time, per patient request.  Patient was given a copy of the AVS report and appointment schedule, per 09/08/16 los.

## 2016-09-08 NOTE — Progress Notes (Signed)
[name] [name] Kitchen    HEMATOLOGY/ONCOLOGY CLINIC NOTE  Date of Service: .09/08/2016  Patient Care Team: Deland Pretty, MD as PCP - General (Internal Medicine)  CHIEF COMPLAINTS/PURPOSE OF CONSULTATION:  F/u for  follicular lymphoma  HISTORY OF PRESENTING ILLNESS:   [name] [name] is a wonderful 82 y.o. female who has been referred to [place] by Dr .Deland Pretty, MD for evaluation and management of newly diagnosed follicular lymphoma.  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 Laverta Baltimore who is a retired Publishing rights manager from [place], [place].  She presented to her primary care physician with 3 months of asymptomatic fullness/swelling in her right upper neck with no overt symptoms of dental pain or swelling, fevers, chills or night sweats.  The swelling was painless and gradually growing.  She was referred to Dr. Jerrell Belfast (ENT) for further evaluation and had a CT of the neck on 12/08/2015 which showed findings "soft tissue mass measuring up to 3.2 cm which most resembles an abnormal right level 1 B lymph node. This medially displaces but seems to be separate from the adjacent right submandibular gland.There is a larger spiculated and somewhat infiltrative appearing right lateral neck mass centered at the level 2 nodal station measuring up to 4.7 cm. This lesion is inseparable from the undersurface of the right sternocleidomastoid muscle, but does not appear to invade the subjacent right carotid space.  Associated small but asymmetrically enlarged and conspicuous right level 3 lymph nodes. No primary pharyngeal or laryngeal tumor identified."  The patient subsequently had incisional biopsy of the right upper neck lymph node on 12/19/2015 which is consistent with a low-grade follicular lymphoma. Patient has been referred for evaluation and  management for follicular lymphoma. She notes no other areas of obviously enlarged lymph nodes. No constitutional symptoms such as fevers, chills, night sweats, unexpected weight loss. Overall feels well and no differently than she felt 6 months ago.  INTERVAL HISTORY  [name] [name] is here for her scheduled followup for follicular lymphoma. She notes no other acute new focal symptoms. No  Overtly enlarged peripheral lymph nodes. No fevers no chills no night sweats.  no new fatigue or unexpected weight loss . She is in good spirits and she'll be doing a [employer] this summer.  MEDICAL HISTORY:  Past Medical History:  Diagnosis Date  . Cancer ([place])    follicular lymphoma  . Cataract   . History of colon polyps    all colonic mucosa  . Hyperlipidemia    on lipitor  . 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 COLONOSCOPY     [id number],[id number]  . EYE SURGERY Bilateral    cataract removal  . 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 POLYPECTOMY     2001,2006-all colonic mucosa   . SUBMANDIBULAR GLAND EXCISION Right 12/19/2015   Procedure: BIOPSY OF RIGHT SUBMANDIBULAR MASS;  Surgeon: Jerrell Belfast, MD;  [facility];  Service: ENT;  Laterality: Right;    SOCIAL HISTORY: Social History   Social History  . Marital status: Married    Spouse name: N/A  . Number of children: N/A  . Years of education: N/A   Occupational History  .  Not on file.   Social History Main Topics  . Smoking status: Never Smoker  . Smokeless tobacco: Never Used  . Alcohol use No  . Drug use: No  . Sexual activity: Not on file   Other Topics Concern  . Not on file   Social History Narrative  . No narrative on file    FAMILY HISTORY: Family History  Problem Relation Age of Onset  . Colon cancer Neg Hx   . Colon  polyps Neg Hx   . Esophageal cancer Neg Hx   . Rectal cancer Neg Hx   . Stomach cancer Neg Hx     ALLERGIES:  is allergic to no known allergies.  MEDICATIONS:  Current Outpatient Prescriptions  Medication Sig Dispense Refill  . aspirin 81 MG tablet Chew 81 mg by mouth daily.    [name] [name] Kitchen atorvastatin (LIPITOR) 20 MG tablet Take 20 mg by mouth.    . BD PEN NEEDLE NANO U/F 32G X 4 MM MISC     . Cholecalciferol (VITAMIN D) 2000 units CAPS Take 2,000 Units by mouth daily.    [name] [name] Kitchen denosumab (PROLIA) 60 MG/ML SOLN injection Inject 60 mg into the skin every 6 (six) months. Administer in upper arm, thigh, or abdomen    . FORTEO 600 MCG/2.4ML SOLN Inject 20 mcg into the skin daily.    . magic mouthwash w/lidocaine SOLN Take 5 mLs by mouth 4 (four) times daily as needed for mouth pain. (Patient not taking: Reported on 04/20/2016) 200 mL 1  . Multiple Vitamins-Minerals (MULTIVITAMIN WITH MINERALS) tablet Take 1 tablet by mouth.    . Omega-3 Fatty Acids (FISH OIL TRIPLE STRENGTH) 1400 MG CAPS Take 1 capsule by mouth daily.    . sucralfate (CARAFATE) 1 GM/10ML suspension Take 10 mLs (1 g total) by mouth 4 (four) times daily -  with meals and at bedtime. (Patient not taking: Reported on 04/20/2016) 420 mL 0   No current facility-administered medications for this visit.     REVIEW OF SYSTEMS:    10 Point review of Systems was done is negative except as noted above.  PHYSICAL EXAMINATION: ECOG PERFORMANCE STATUS: 1 - Symptomatic but completely ambulatory  . Vitals:   09/08/16 1006  BP: (!) 152/65  Pulse: 73  Resp: 18  Temp: 98.7 F (37.1 C)   Filed Weights   09/08/16 1006  Weight: 109 lb 14.4 oz (49.9 kg)   .Body mass index is 20.77 kg/m.  GENERAL:alert, in no acute distress and comfortable SKIN: skin color, texture, turgor are normal, no rashes or significant lesions EYES: normal, conjunctiva are pink and non-injected, sclera clear OROPHARYNX: MMM NECK: supple, no JVD, thyroid normal size,  non-tender, without nodularity LYMPH:  No palpable cervical or axillary LNadenoapathy. LUNGS: clear to auscultation with normal respiratory effort HEART: regular rate & rhythm,  no murmurs and no lower extremity edema ABDOMEN: abdomen soft, non-tender, normoactive bowel sounds , no palpable hepatosplenomegaly Musculoskeletal: no cyanosis of digits and no clubbing  PSYCH: alert & oriented x 3 with fluent speech NEURO: no focal motor/sensory deficits  LABORATORY DATA:  I have reviewed the data as listed  . CBC Latest Ref Rng & Units 09/08/2016 06/09/2016 03/02/2016  WBC 3.9 - 10.3 10e3/uL 8.7 7.7 9.7  Hemoglobin 11.6 - 15.9 g/dL 13.9 14.0 14.5  Hematocrit 34.8 - 46.6 % 42.6 42.4 43.8  Platelets 145 - 400 10e3/uL 276 234 242    . CMP Latest Ref Rng & Units 09/08/2016 06/09/2016 03/02/2016  Glucose 70 -  140 mg/dl 91 93 97  BUN 7.0 - 26.0 mg/dL 15.7 19.4 11.4  Creatinine 0.6 - 1.1 mg/dL 0.8 0.8 0.8  Sodium 136 - 145 mEq/L 142 143 142  Potassium 3.5 - 5.1 mEq/L 4.8 5.1 4.3  Chloride 101 - 111 mmol/L - - -  CO2 22 - 29 mEq/L '28 29 26  ' Calcium 8.4 - 10.4 mg/dL 10.3 10.2 10.6(H)  Total Protein 6.4 - 8.3 g/dL 6.7 6.9 7.5  Total Bilirubin 0.20 - 1.20 mg/dL 0.48 0.59 0.69  Alkaline Phos 40 - 150 U/L 61 59 73  AST 5 - 34 U/L '22 21 24  ' ALT 0 - 55 U/L '18 15 18   ' . Lab Results  Component Value Date   LDH 170 06/09/2016        RADIOGRAPHIC STUDIES: I have personally reviewed the radiological images as listed and agreed with the findings in the report. No results found.  ASSESSMENT & PLAN:   82 year old very pleasant Caucasian lady with  #1 Stage IIIA low-grade follicular lymphoma ( grade 1-2/3) presenting with right upper neck lymphadenopathy. Patient imaging shows that this is at least a stage IIIA. No overt evidence of bone marrow involvement but bone marrow biopsy is not planned for additional staging based on patient preference and the fact that it will not change treatment at  this time . LDH level within normal limits FLIPI score - intermediate risk with 5 year OS 78% and ten-year overall survival 51% (from data in the pre-rituxan era) S/p ISRT to cervical LN  Disease. Plan -Patient has no clinical or lab evidence of lymphoma progression at this time. -We'll follow up on pending LDH level . -no strong indication for systemic therapies for the patients follicular lymphoma at this time. -encourage healthy and active lifestyle. -Repeat PET scan in 3 months  #2 history of lung carcinoid status post surgery in 2008. No concern with recurrence at this time.  Return to care with Dr. Irene Limbo in 3 months with CBC, CMP and LDH  and PET/CT scan  Earlier if any acute new concerns.  . Orders Placed This Encounter  Procedures  . NM PET Image Restag (PS) Skull Base To Thigh    Standing Status:   Future    Standing Expiration Date:   09/08/2017    Order Specific Question:   Reason for Exam (SYMPTOM  OR DIAGNOSIS REQUIRED)    Answer:   re-evaluation for Non hodgkins lymphoma to determine further treatment strategy    Order Specific Question:   If indicated for the ordered procedure, I authorize the administration of a radiopharmaceutical per Radiology protocol    Answer:   Yes    Order Specific Question:   Preferred imaging location?    Answer:   [facility]    Order Specific Question:   Radiology Contrast Protocol - do NOT remove file path    Answer:   [email or web address]  . CBC & Diff and Retic    Standing Status:   Future    Standing Expiration Date:   09/08/2017  . Comprehensive metabolic panel    Standing Status:   Future    Standing Expiration Date:   09/08/2017  . Lactate dehydrogenase    Standing Status:   Future    Standing Expiration Date:   09/08/2017    All of the patients and her husbands questions were answered to their apparent satisfaction. The patient knows to call the clinic with any problems, questions or  concerns.  I  spent 20 minutes counseling the patient face to face. The total time spent in the appointment was 25 minutes and more than 50% was on counseling  regarding treatment options and the rationale prognosis and direct patient cares.    Sullivan Lone MD [place] AAHIVMS [facility] Hematology/Oncology Physician [facility]  (Office):       [phone] (Work cell):  [phone] (Fax):           [phone]

## 2016-09-08 NOTE — Patient Instructions (Signed)
Thank you for choosing [facility] to provide your oncology and hematology care.  To afford each patient quality time with our providers, please arrive 30 minutes before your scheduled appointment time.  If you arrive late for your appointment, you may be asked to reschedule.  We strive to give you quality time with our providers, and arriving late affects you and other patients whose appointments are after yours.   If you are a no show for multiple scheduled visits, you may be dismissed from the clinic at the providers discretion.    Again, thank you for choosing [facility], our hope is that these requests will decrease the amount of time that you wait before being seen by our physicians.  ______________________________________________________________________  Should you have questions after your visit to the [facility], please contact our office at [phone] between the hours of 8:30 and 4:30 p.m.    Voicemails left after 4:30p.m will not be returned until the following business day.    For prescription refill requests, please have your pharmacy contact us directly.  Please also try to allow 48 hours for prescription requests.    Please contact the scheduling department for questions regarding scheduling.  For scheduling of procedures such as PET scans, CT scans, MRI, Ultrasound, etc please contact central scheduling at [phone].    Resources For Cancer Patients and Caregivers:   Oncolink.org:  A wonderful resource for patients and healthcare providers for information regarding your disease, ways to tract your treatment, what to expect, etc.     American Cancer Society:  800-227-2345  Can help patients locate various types of support and financial assistance  Cancer Care: 1-800-813-HOPE (4673) Provides financial assistance, online support groups, medication/co-pay assistance.    [place] DSS:  336-641-3447 Where to apply for food  stamps, Medicaid, and utility assistance  Medicare Rights Center: 800-333-4114 Helps people with Medicare understand their rights and benefits, navigate the Medicare system, and secure the quality healthcare they deserve  SCAT: 336-333-6589 [employer]'s shared-ride transportation service for eligible riders who have a disability that prevents them from riding the fixed route bus.    For additional information on assistance programs please contact our social worker:   Grier Hock/Abigail Elmore:  [phone]            

## 2016-09-14 ENCOUNTER — Other Ambulatory Visit: Payer: Medicare Other

## 2016-09-14 ENCOUNTER — Ambulatory Visit: Payer: Medicare Other | Admitting: Hematology

## 2016-09-16 ENCOUNTER — Other Ambulatory Visit: Payer: Medicare Other

## 2016-09-16 ENCOUNTER — Ambulatory Visit: Payer: Medicare Other | Admitting: Hematology

## 2016-09-30 DIAGNOSIS — M810 Age-related osteoporosis without current pathological fracture: Secondary | ICD-10-CM | POA: Diagnosis not present

## 2016-11-23 DIAGNOSIS — H1789 Other corneal scars and opacities: Secondary | ICD-10-CM | POA: Diagnosis not present

## 2016-11-23 DIAGNOSIS — H5213 Myopia, bilateral: Secondary | ICD-10-CM | POA: Diagnosis not present

## 2016-11-23 DIAGNOSIS — Z961 Presence of intraocular lens: Secondary | ICD-10-CM | POA: Diagnosis not present

## 2016-11-24 DIAGNOSIS — Z0000 Encounter for general adult medical examination without abnormal findings: Secondary | ICD-10-CM | POA: Diagnosis not present

## 2016-11-24 DIAGNOSIS — E7800 Pure hypercholesterolemia, unspecified: Secondary | ICD-10-CM | POA: Diagnosis not present

## 2016-11-24 DIAGNOSIS — M810 Age-related osteoporosis without current pathological fracture: Secondary | ICD-10-CM | POA: Diagnosis not present

## 2016-11-24 DIAGNOSIS — Z7982 Long term (current) use of aspirin: Secondary | ICD-10-CM | POA: Diagnosis not present

## 2016-11-24 DIAGNOSIS — E559 Vitamin D deficiency, unspecified: Secondary | ICD-10-CM | POA: Diagnosis not present

## 2016-11-24 DIAGNOSIS — M8000XA Age-related osteoporosis with current pathological fracture, unspecified site, initial encounter for fracture: Secondary | ICD-10-CM | POA: Diagnosis not present

## 2016-11-30 DIAGNOSIS — E7800 Pure hypercholesterolemia, unspecified: Secondary | ICD-10-CM | POA: Diagnosis not present

## 2016-11-30 DIAGNOSIS — C8201 Follicular lymphoma grade I, lymph nodes of head, face, and neck: Secondary | ICD-10-CM | POA: Diagnosis not present

## 2016-11-30 DIAGNOSIS — M810 Age-related osteoporosis without current pathological fracture: Secondary | ICD-10-CM | POA: Diagnosis not present

## 2016-11-30 DIAGNOSIS — M4120 Other idiopathic scoliosis, site unspecified: Secondary | ICD-10-CM | POA: Diagnosis not present

## 2016-12-07 ENCOUNTER — Ambulatory Visit (HOSPITAL_COMMUNITY)
Admission: RE | Admit: 2016-12-07 | Discharge: 2016-12-07 | Disposition: A | Discharge status: Home / Self Care | Payer: Medicare Other | Source: Ambulatory Visit | Attending: Hematology | Admitting: Hematology

## 2016-12-07 DIAGNOSIS — N200 Calculus of kidney: Secondary | ICD-10-CM | POA: Insufficient documentation

## 2016-12-07 DIAGNOSIS — R188 Other ascites: Secondary | ICD-10-CM | POA: Diagnosis not present

## 2016-12-07 DIAGNOSIS — R591 Generalized enlarged lymph nodes: Secondary | ICD-10-CM | POA: Insufficient documentation

## 2016-12-07 DIAGNOSIS — C8290 Follicular lymphoma, unspecified, unspecified site: Secondary | ICD-10-CM | POA: Diagnosis not present

## 2016-12-07 DIAGNOSIS — M419 Scoliosis, unspecified: Secondary | ICD-10-CM | POA: Insufficient documentation

## 2016-12-07 DIAGNOSIS — C8201 Follicular lymphoma grade I, lymph nodes of head, face, and neck: Secondary | ICD-10-CM | POA: Diagnosis not present

## 2016-12-07 DIAGNOSIS — I700 Atherosclerosis of aorta: Secondary | ICD-10-CM | POA: Diagnosis not present

## 2016-12-07 LAB — GLUCOSE, CAPILLARY: Glucose-Capillary: 94 mg/dL (ref 65–99)

## 2016-12-07 IMAGING — CT NM PET TUM IMG RESTAG (PS) SKULL BASE T - THIGH
6 series · 25 of 25 positions shown · non-contrast
Comparison: Multiple exams, including 06/09/2016

CLINICAL DATA: Subsequent treatment strategy for follicular
lymphoma.

EXAM:
NUCLEAR MEDICINE PET SKULL BASE TO THIGH
TECHNIQUE: 5.4 mCi F-18 FDG was injected intravenously. Full-ring PET imaging
was performed from the skull base to thigh after the radiotracer. CT
data was obtained and used for attenuation correction and anatomic
localization.
FASTING BLOOD GLUCOSE:  Value: 94 mg/dl

[Series 3: pet sk_thigh ac · axial · 5.0mm · 4.07mm/px · z∈[-787,+5]mm · 7 of 199 slices shown]
[im 1/199]
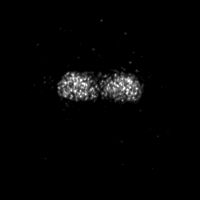
[im 34/199]
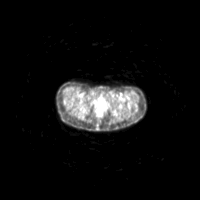
[im 67/199]
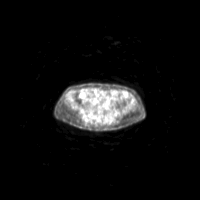
[im 100/199]
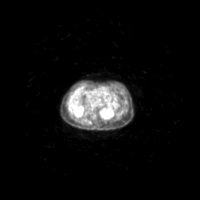
[im 133/199]
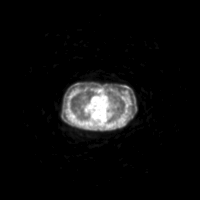
[im 166/199]
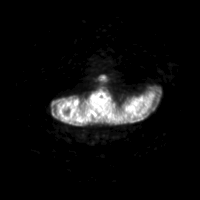
[im 199/199]
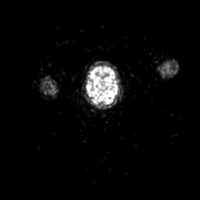

[Series 4: ct sk_thigh 5.0 b31f · axial · 5.0mm · 0.98mm/px · z∈[-787,+5]mm · 6 of 199 slices shown]
[im 1/199]
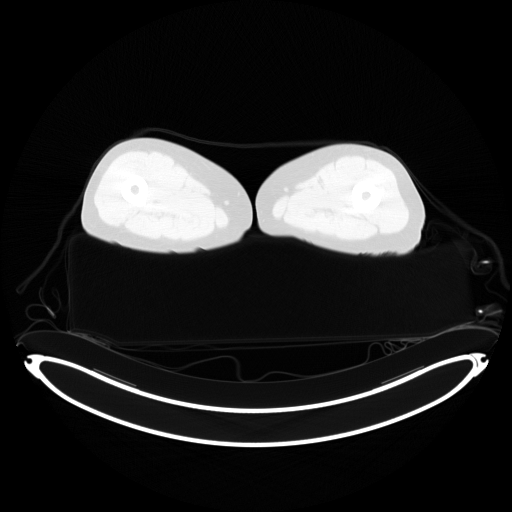
[im 40/199]
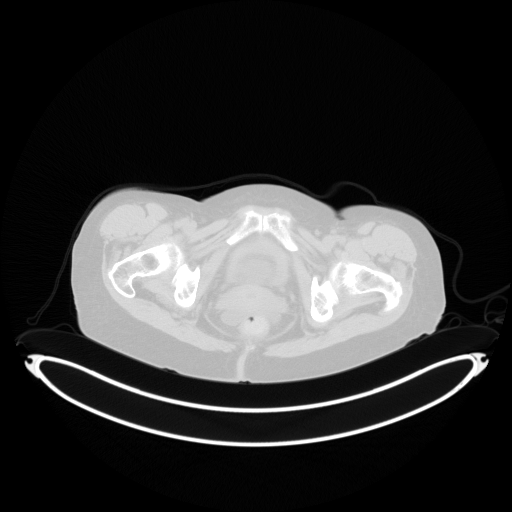
[im 80/199]
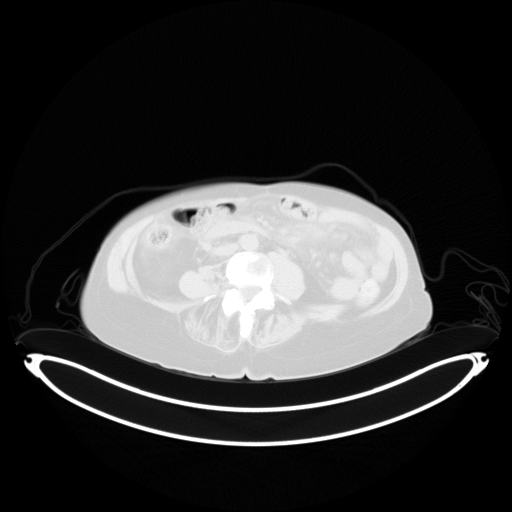
[im 119/199]
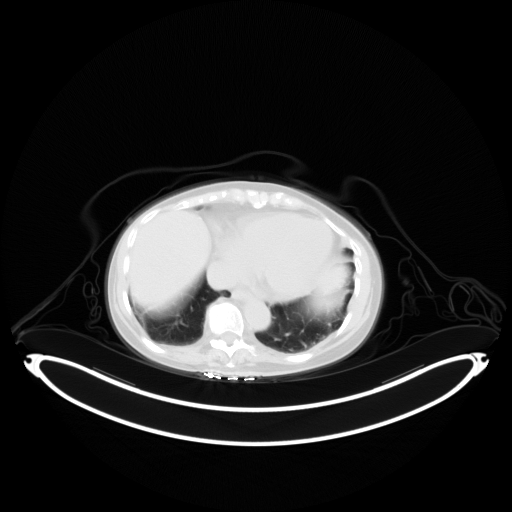
[im 159/199]
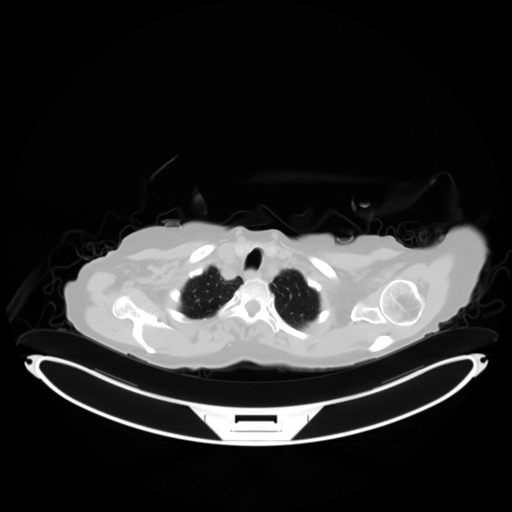
[im 199/199  brain]
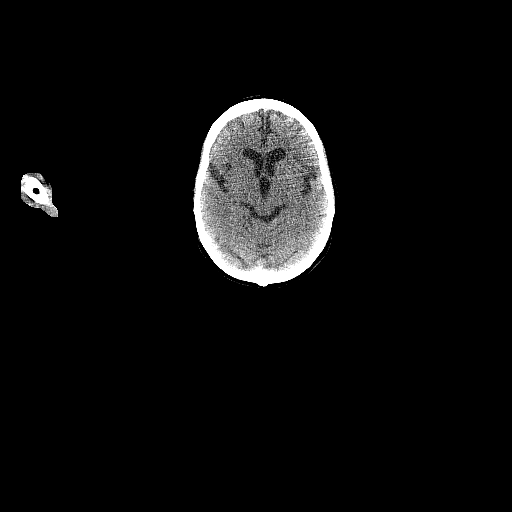

[Series 7: pet sk_thigh nac · axial · 5.0mm · 4.07mm/px · z∈[-787,+5]mm · 6 of 199 slices shown]
[im 1/199]
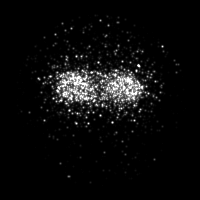
[im 40/199]
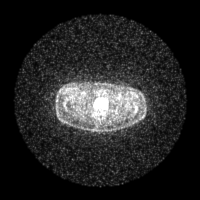
[im 80/199]
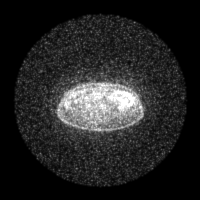
[im 119/199]
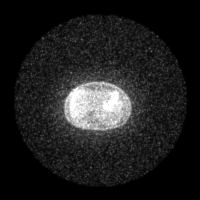
[im 159/199]
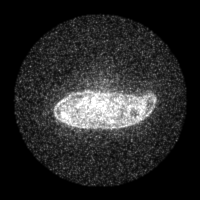
[im 199/199]
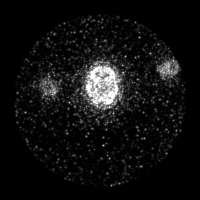

[Series 8: ct sk_thigh 5.0 b70f lung_bone · axial · 5.0mm · 0.52mm/px · z∈[-347,-119]mm · 2 of 58 slices shown]
[im 1/58  bone]
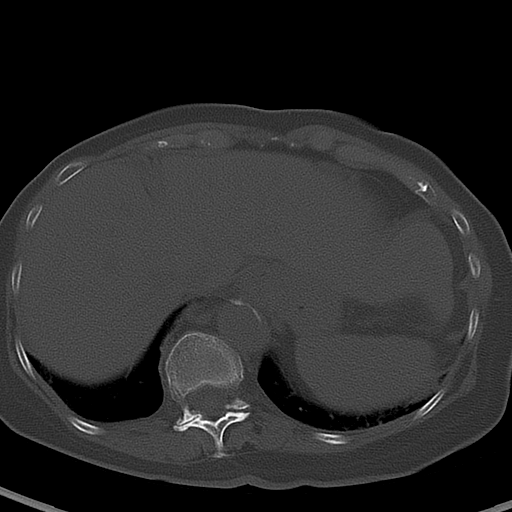
[im 58/58  bone]
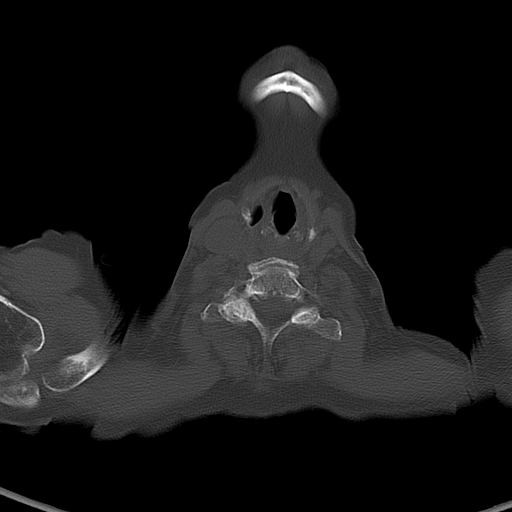

[Series 604: mip collection · coronal · 1.68mm/px · 1 of 32 slices shown]
[im 1/32]
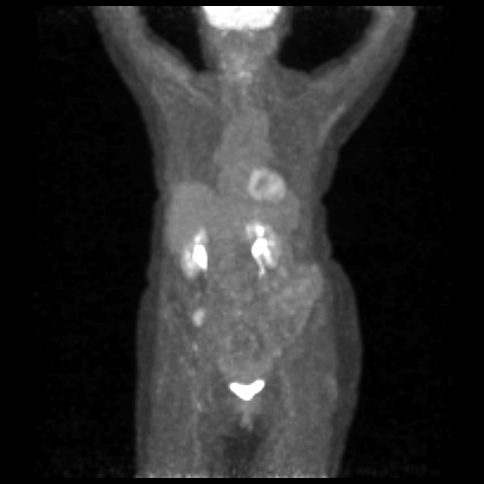

[Series 605: range-ct sk_thigh 5.0 (id)<alpha range> · 3 of 83 slices shown]
[im 1/83]
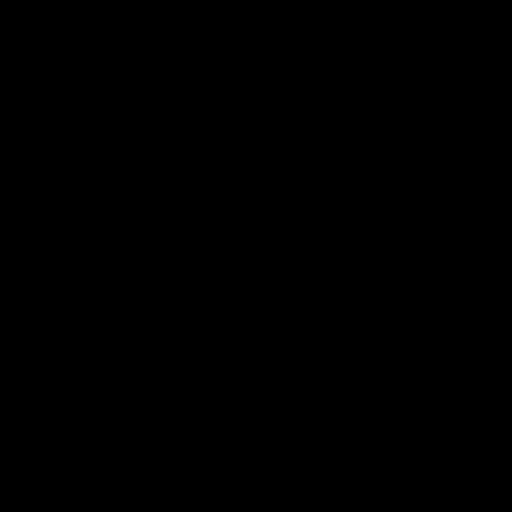
[im 42/83]
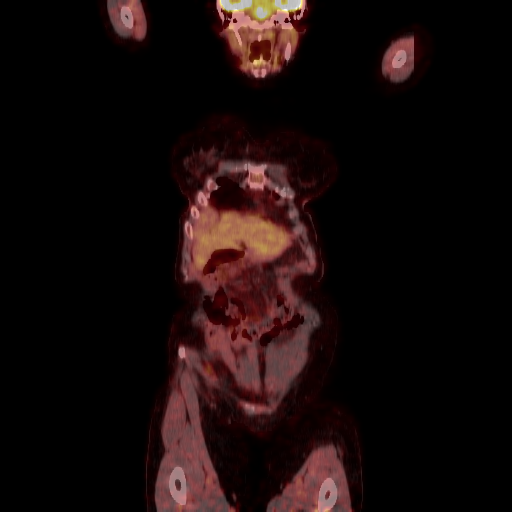
[im 83/83]
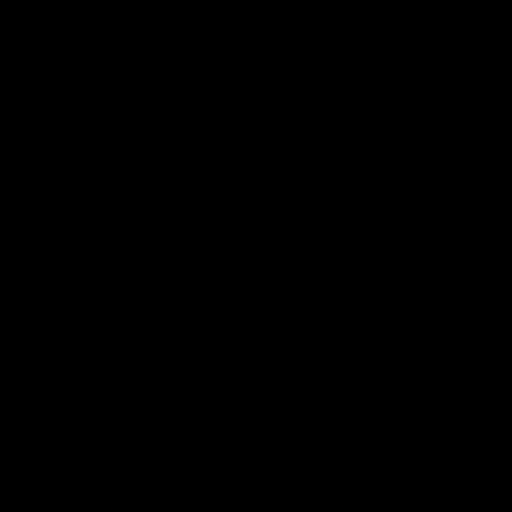

[25 of 25 positions shown; findings below may reference images not displayed]

FINDINGS: NECK

No hypermetabolic lymph nodes in the neck. No recurrent nodal
enlargement in the neck.

CHEST

No hypermetabolic mediastinal or hilar nodes. No suspicious
pulmonary nodules on the CT data. Prior activity adjacent to the
lower esophagus and thoracic aorta has resolved.

Airway thickening noted with some mild airway plugging notably in
the right upper lobe. Scarring anteriorly at the right lung base
with prior right middle lobectomy.

Background mediastinal blood pool activity is 2.3.

ABDOMEN/PELVIS

No abnormal hypermetabolic activity within the liver, pancreas,
adrenal glands, or spleen. The mesenteric stranding and mesenteric
lymph nodes are observed with a low-density left lower quadrant
indistinctly marginated mesenteric mass measuring 1.5 cm in short
axis on image 124/4, formerly 1.6 cm. The maximum standard uptake
value of this process has significantly reduced, current SUV 2.2 and
previously 4.8.

Bandlike soft tissue density along the inferior margin of the right
paracolic gutter measures 9 mm in thickness (previously 8 mm) with
maximum SUV 5.3 (formerly 6.0).

Indistinct nodal tissue along the left pelvic sidewall measures 10
with maximum standard uptake value 2.9 (formerly 2.9).

Fatty hilum in the 1.1 cm right inguinal lymph node (previously
cm) with maximum SUV 3.3 (formerly 5.9).

Aortoiliac atherosclerotic vascular disease. 2 mm left kidney lower
pole nonobstructive renal calculus. 2 mm right kidney lower pole
nonobstructive renal calculus. Mesenteric stranding. Small but
improved amount of pelvic ascites. Dextroconvex thoracolumbar
scoliosis with rotary component.

Background liver activity is 2.9.

SKELETON

No focal hypermetabolic activity to suggest skeletal metastasis.
IMPRESSION: 1. Overall generally improved appearance, with reduced activity
along previously identified hypermetabolic adenopathy and tumor. The
prior thoracic involvement has resolved and no new involvement in
the neck is identified. The remaining tissue is [HOSPITAL] 2 and
[HOSPITAL] 3 in activity.
2. Other imaging findings of potential clinical significance: Aortic
Atherosclerosis ([id number]-[id number].[id number]). Bilateral airway thickening.
Nonobstructive renal calculi. Chronic mesenteric stranding with a
small but improved amount of ascites. Scoliosis.

## 2016-12-07 MED ORDER — FLUDEOXYGLUCOSE F - 18 (FDG) INJECTION
5.4100 | Freq: Once | INTRAVENOUS | Status: AC | PRN
  Administered 2016-12-07: 5.41 via INTRAVENOUS

## 2016-12-27 NOTE — Progress Notes (Signed)
[name] [name]    HEMATOLOGY/ONCOLOGY CLINIC NOTE  Date of Service: 12/28/2016  Patient Care Team: [name] [name] as PCP - General (Internal Medicine)  CHIEF COMPLAINTS/PURPOSE OF CONSULTATION:  F/u for  follicular lymphoma  HISTORY OF PRESENTING ILLNESS:   [name] [name] is a wonderful 82 y.o. female who has been referred to [place] by Dr .[name] [name] for evaluation and management of newly diagnosed follicular lymphoma.  She is a very pleasant lady with good overall health with a history of lung carcinoid status post surgery in February 2008, osteoporosis, colonic polyps and dyslipidemia who is here for her clinic visit with her husband [name] [name] who is a retired Publishing rights manager from [place], [place].  She presented to her primary care physician with 3 months of asymptomatic fullness/swelling in her right upper neck with no overt symptoms of dental pain or swelling, fevers, chills or night sweats.  The swelling was painless and gradually growing.  She was referred to Dr. Jerrell [name] (ENT) for further evaluation and had a CT of the neck on 12/08/2015 which showed findings "soft tissue mass measuring up to 3.2 cm which most resembles an abnormal right level 1 B lymph node. This medially displaces but seems to be separate from the adjacent right submandibular gland.There is a larger spiculated and somewhat infiltrative appearing right lateral neck mass centered at the level 2 nodal station measuring up to 4.7 cm. This lesion is inseparable from the undersurface of the right sternocleidomastoid muscle, but does not appear to invade the subjacent right carotid space.  Associated small but asymmetrically enlarged and conspicuous right level 3 lymph nodes. No primary pharyngeal or laryngeal tumor identified."  The patient subsequently had incisional biopsy of the right upper neck lymph node on 12/19/2015 which is consistent with a low-grade follicular lymphoma. Patient has been referred for evaluation and management  for follicular lymphoma. She notes no other areas of obviously enlarged lymph nodes. No constitutional symptoms such as fevers, chills, night sweats, unexpected weight loss. Overall feels well and no differently than she felt 6 months ago.  INTERVAL HISTORY  Ms Riviera is here for her scheduled followup for follicular lymphoma and discuss her recent Pet scan results. She has returned from her trip last week which she thoroughly enjoyed.  No new fatigue or unexpected weight loss. Denies noticing enlarged peripheral lymph nodes. PET/CT results were discussed and not noted to have any significant evidence of lymphoma progression.  On review of systems, she denies any new back pains, abdominal pains or abnormal BM/change in urination.  MEDICAL HISTORY:  Past Medical History:  Diagnosis Date  . Cancer ([name] [name])    follicular lymphoma  . Cataract   . History of colon polyps    all colonic mucosa  . Hyperlipidemia    on lipitor  . 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 COLONOSCOPY     [id number]  . EYE SURGERY Bilateral    cataract removal  . 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 POLYPECTOMY     2001,2006-all colonic mucosa   . SUBMANDIBULAR GLAND EXCISION Right 12/19/2015   Procedure: BIOPSY OF RIGHT SUBMANDIBULAR MASS;  Surgeon: [name] Belfast, [name];  Location[facility];  Service: ENT;  Laterality: Right;    SOCIAL HISTORY: Social History   Social History  . Marital status: Married  Spouse name: N/A  . Number of children: N/A  . Years of education: N/A   Occupational History  . Not on file.   Social History Main Topics  . Smoking status: Never Smoker  . Smokeless tobacco: Never Used  . Alcohol use No  . Drug use: No  . Sexual activity: Not on file   Other Topics Concern  . Not on file    Social History Narrative  . No narrative on file    FAMILY HISTORY: Family History  Problem Relation Age of Onset  . Colon cancer Neg Hx   . Colon polyps Neg Hx   . Esophageal cancer Neg Hx   . Rectal cancer Neg Hx   . Stomach cancer Neg Hx     ALLERGIES:  is allergic to no known allergies.  MEDICATIONS:  Current Outpatient Prescriptions  Medication Sig Dispense Refill  . aspirin 81 MG tablet Chew 81 mg by mouth daily.    [name] [name] atorvastatin (LIPITOR) 20 MG tablet Take 20 mg by mouth.    . BD PEN NEEDLE NANO U/F 32G X 4 MM MISC     . Cholecalciferol (VITAMIN D) 2000 units CAPS Take 2,000 Units by mouth daily.    [name] [name] denosumab (PROLIA) 60 MG/ML SOLN injection Inject 60 mg into the skin every 6 (six) months. Administer in upper arm, thigh, or abdomen    . FORTEO 600 MCG/2.4ML SOLN Inject 20 mcg into the skin daily.    . Multiple Vitamins-Minerals (MULTIVITAMIN WITH MINERALS) tablet Take 1 tablet by mouth.    . Omega-3 Fatty Acids (FISH OIL TRIPLE STRENGTH) 1400 MG CAPS Take 1 capsule by mouth daily.     No current facility-administered medications for this visit.     REVIEW OF SYSTEMS:  10 Point review of Systems was done is negative except as noted above.  PHYSICAL EXAMINATION:  ECOG PERFORMANCE STATUS: 1 - Symptomatic but completely ambulatory  . Vitals:   12/28/16 0943  BP: (!) 145/70  Pulse: 75  Resp: 18  Temp: 97.8 F (36.6 C)  SpO2: 98%   Filed Weights   12/28/16 0943  Weight: 110 lb 4.8 oz (50 kg)   .Body mass index is 20.84 kg/m.  GENERAL:alert, in no acute distress and comfortable SKIN: skin color, texture, turgor are normal, no rashes or significant lesions EYES: normal, conjunctiva are pink and non-injected, sclera clear OROPHARYNX: MMM NECK: supple, no JVD, thyroid normal size, non-tender, without nodularity LYMPH:  No palpable cervical or axillary LNadenoapathy. LUNGS: clear to auscultation with normal respiratory effort HEART: regular rate &  rhythm,  no murmurs and no lower extremity edema ABDOMEN: abdomen soft, non-tender, normoactive bowel sounds , no palpable hepatosplenomegaly Musculoskeletal: no cyanosis of digits and no clubbing  PSYCH: alert & oriented x 3 with fluent speech NEURO: no focal motor/sensory deficits  LABORATORY DATA:  I have reviewed the data as listed  . CBC Latest Ref Rng & Units 12/28/2016 09/08/2016 06/09/2016  WBC 3.9 - 10.3 10e3/uL 8.8 8.7 7.7  Hemoglobin 11.6 - 15.9 g/dL 14.1 13.9 14.0  Hematocrit 34.8 - 46.6 % 43.0 42.6 42.4  Platelets 145 - 400 10e3/uL 225 276 234    . CMP Latest Ref Rng & Units 12/28/2016 09/08/2016 06/09/2016  Glucose 70 - 140 mg/dl 95 91 93  BUN 7.0 - 26.0 mg/dL 24.2 15.7 19.4  Creatinine 0.6 - 1.1 mg/dL 0.9 0.8 0.8  Sodium 136 - 145 mEq/L 143 142 143  Potassium 3.5 - 5.1 mEq/L 4.3 4.8  5.1  Chloride 101 - 111 mmol/L - - -  CO2 22 - 29 mEq/L '28 28 29  ' Calcium 8.4 - 10.4 mg/dL 10.5(H) 10.3 10.2  Total Protein 6.4 - 8.3 g/dL 7.0 6.7 6.9  Total Bilirubin 0.20 - 1.20 mg/dL 0.62 0.48 0.59  Alkaline Phos 40 - 150 U/L 55 61 59  AST 5 - 34 U/L '25 22 21  ' ALT 0 - 55 U/L '19 18 15   ' . Lab Results  Component Value Date   LDH 174 12/28/2016         RADIOGRAPHIC STUDIES: I have personally reviewed the radiological images as listed and agreed with the findings in the report. Nm Pet Image Restag (ps) Skull Base To Thigh  Result Date: 12/07/2016 CLINICAL DATA:  Subsequent treatment strategy for follicular lymphoma. EXAM: NUCLEAR MEDICINE PET SKULL BASE TO THIGH TECHNIQUE: 5.4 mCi F-18 FDG was injected intravenously. Full-ring PET imaging was performed from the skull base to thigh after the radiotracer. CT data was obtained and used for attenuation correction and anatomic localization. FASTING BLOOD GLUCOSE:  Value: 94 mg/dl COMPARISON:  Multiple exams, including 06/09/2016 FINDINGS: NECK No hypermetabolic lymph nodes in the neck. No recurrent nodal enlargement in the neck. CHEST No  hypermetabolic mediastinal or hilar nodes. No suspicious pulmonary nodules on the CT data. Prior activity adjacent to the lower esophagus and thoracic aorta has resolved. Airway thickening noted with some mild airway plugging notably in the right upper lobe. Scarring anteriorly at the right lung base with prior right middle lobectomy. Background mediastinal blood pool activity is 2.3. ABDOMEN/PELVIS No abnormal hypermetabolic activity within the liver, pancreas, adrenal glands, or spleen. The mesenteric stranding and mesenteric lymph nodes are observed with a low-density left lower quadrant indistinctly marginated mesenteric mass measuring 1.5 cm in short axis on image 124/4, formerly 1.6 cm. The maximum standard uptake value of this process has significantly reduced, current SUV 2.2 and previously 4.8. Bandlike soft tissue density along the inferior margin of the right paracolic gutter measures 9 mm in thickness (previously 8 mm) with maximum SUV 5.3 (formerly 6.0). Indistinct nodal tissue along the left pelvic sidewall measures 10 mm in short axis on image 147/4 (formerly 7 mm by my measurement) with maximum standard uptake value 2.9 (formerly 2.9). Fatty hilum in the 1.1 cm right inguinal lymph node (previously 1.2 cm) with maximum SUV 3.3 (formerly 5.9). Aortoiliac atherosclerotic vascular disease. 2 mm left kidney lower pole nonobstructive renal calculus. 2 mm right kidney lower pole nonobstructive renal calculus. Mesenteric stranding. Small but improved amount of pelvic ascites. Dextroconvex thoracolumbar scoliosis with rotary component. Background liver activity is 2.9. SKELETON No focal hypermetabolic activity to suggest skeletal metastasis. IMPRESSION: 1. Overall generally improved appearance, with reduced activity along previously identified hypermetabolic adenopathy and tumor. The prior thoracic involvement has resolved and no new involvement in the neck is identified. The remaining tissue is [place]auville 2  and Deauville 3 in activity. 2. Other imaging findings of potential clinical significance: Aortic Atherosclerosis (ICD10-I70.0). Bilateral airway thickening. Nonobstructive renal calculi. Chronic mesenteric stranding with a small but improved amount of ascites. Scoliosis. Electronically Signed   By: Van Clines M.D.   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 Patient imaging shows that this is at least a stage IIIA. No overt evidence of bone marrow involvement but bone marrow biopsy is not  planned for additional staging based on patient preference and the fact that it will not change treatment at this time . LDH level within normal limits FLIPI score - intermediate risk with 5 year OS 78% and ten-year overall survival 51% (from data in the pre-rituxan era) S/p ISRT to cervical LN  Disease.  Plan -Patient has no clinical, radiographic or lab evidence of lymphoma progression at this time. -no strong indication for systemic therapies for the patients follicular lymphoma at this time. -encouraged healthy and active lifestyle. -PET scan from 12/07/2016 reviewed in details and is noted to be stable  #2 history of lung carcinoid status post surgery in 2008. No concern with recurrence at this time.  Return to care with Dr. Irene Limbo in 4 months with CBC, CMP and LDH Earlier if any acute new concerns.   Orders Placed This Encounter  Procedures  . CBC & Diff and Retic    Standing Status:   Future    Standing Expiration Date:   12/28/2017  . Comprehensive metabolic panel    Standing Status:   Future    Standing Expiration Date:   12/28/2017  . Lactate dehydrogenase    Standing Status:   Future    Standing Expiration Date:   12/28/2017    All of the patients and her husbands questions were answered to their apparent satisfaction. The  patient knows to call the clinic with any problems, questions or concerns.  I spent 20 minutes counseling the patient face to face. The total time spent in the appointment was 25 minutes and more than 50% was on counseling  regarding treatment options and the rationale prognosis and direct patient cares.  This document serves as a record of services personally performed by Sullivan Lone, [name]. It was created on her behalf by Brandt Loosen, a trained medical scribe. The creation of this record is based on the scribe's personal observations and the provider's statements to them. This document has been checked and approved by the attending provider.   Sullivan Lone [name] [place] AAHIVMS [facility] Hematology/Oncology Physician [facility]  (Office):       [phone] (Work cell):  [phone] (Fax):           [phone]

## 2016-12-28 ENCOUNTER — Encounter: Payer: Self-pay | Admitting: Hematology

## 2016-12-28 ENCOUNTER — Other Ambulatory Visit (HOSPITAL_BASED_OUTPATIENT_CLINIC_OR_DEPARTMENT_OTHER): Payer: Medicare Other

## 2016-12-28 ENCOUNTER — Ambulatory Visit (HOSPITAL_BASED_OUTPATIENT_CLINIC_OR_DEPARTMENT_OTHER): Payer: Medicare Other | Admitting: Hematology

## 2016-12-28 VITALS — BP 145/70 | HR 75 | Temp 97.8°F | Resp 18 | Ht 61.0 in | Wt 110.3 lb

## 2016-12-28 DIAGNOSIS — C8201 Follicular lymphoma grade I, lymph nodes of head, face, and neck: Secondary | ICD-10-CM | POA: Diagnosis not present

## 2016-12-28 DIAGNOSIS — M818 Other osteoporosis without current pathological fracture: Secondary | ICD-10-CM

## 2016-12-28 LAB — CBC & DIFF AND RETIC
BASO%: 0.7 % (ref 0.0–2.0)
BASOS ABS: 0.1 10*3/uL (ref 0.0–0.1)
EOS ABS: 0.1 10*3/uL (ref 0.0–0.5)
EOS%: 1.3 % (ref 0.0–7.0)
HEMATOCRIT: 43 % (ref 34.8–46.6)
HEMOGLOBIN: 14.1 g/dL (ref 11.6–15.9)
IMMATURE RETIC FRACT: 3.3 % (ref 1.60–10.00)
LYMPH%: 27.6 % (ref 14.0–49.7)
MCH: 29.9 pg (ref 25.1–34.0)
MCHC: 32.8 g/dL (ref 31.5–36.0)
MCV: 91.1 fL (ref 79.5–101.0)
MONO#: 0.6 10*3/uL (ref 0.1–0.9)
MONO%: 6.4 % (ref 0.0–14.0)
NEUT#: 5.6 10*3/uL (ref 1.5–6.5)
NEUT%: 64 % (ref 38.4–76.8)
Platelets: 225 10*3/uL (ref 145–400)
RBC: 4.72 10*6/uL (ref 3.70–5.45)
RDW: 14.5 % (ref 11.2–14.5)
RETIC %: 1.17 % (ref 0.70–2.10)
Retic Ct Abs: 55.22 10*3/uL (ref 33.70–90.70)
WBC: 8.8 10*3/uL (ref 3.9–10.3)
lymph#: 2.4 10*3/uL (ref 0.9–3.3)

## 2016-12-28 LAB — LACTATE DEHYDROGENASE: LDH: 174 U/L (ref 125–245)

## 2016-12-28 LAB — COMPREHENSIVE METABOLIC PANEL
ALBUMIN: 4 g/dL (ref 3.5–5.0)
ALK PHOS: 55 U/L (ref 40–150)
ALT: 19 U/L (ref 0–55)
AST: 25 U/L (ref 5–34)
Anion Gap: 8 mEq/L (ref 3–11)
BILIRUBIN TOTAL: 0.62 mg/dL (ref 0.20–1.20)
BUN: 24.2 mg/dL (ref 7.0–26.0)
CALCIUM: 10.5 mg/dL — AB (ref 8.4–10.4)
CO2: 28 mEq/L (ref 22–29)
CREATININE: 0.9 mg/dL (ref 0.6–1.1)
Chloride: 107 mEq/L (ref 98–109)
EGFR: 65 mL/min/{1.73_m2} — ABNORMAL LOW (ref >90)
GLUCOSE: 95 mg/dL (ref 70–140)
POTASSIUM: 4.3 meq/L (ref 3.5–5.1)
Sodium: 143 mEq/L (ref 136–145)
Total Protein: 7 g/dL (ref 6.4–8.3)

## 2016-12-29 ENCOUNTER — Telehealth: Payer: Self-pay | Admitting: Hematology

## 2016-12-29 NOTE — Telephone Encounter (Signed)
Called patient regarding January 2019

## 2017-02-15 DIAGNOSIS — Z23 Encounter for immunization: Secondary | ICD-10-CM | POA: Diagnosis not present

## 2017-04-26 NOTE — Progress Notes (Signed)
[name]    HEMATOLOGY/ONCOLOGY CLINIC NOTE  Date of Service: 04/29/2017  Patient Care Team: Deland Pretty, MD as PCP - General (Internal Medicine)  CHIEF COMPLAINTS/PURPOSE OF CONSULTATION:  F/u for  follicular lymphoma  HISTORY OF PRESENTING ILLNESS:   [name] [name] is a wonderful 83 y.o. female who has been referred to [place] by Dr .Deland Pretty, MD for evaluation and management of newly diagnosed follicular lymphoma.  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 Laverta Baltimore who is a retired Publishing rights manager from [place], [place].  She presented to her primary care physician with 3 months of asymptomatic fullness/swelling in her right upper neck with no overt symptoms of dental pain or swelling, fevers, chills or night sweats.  The swelling was painless and gradually growing.  She was referred to [name] [name] (ENT) for further evaluation and had a CT of the neck on 12/08/2015 which showed findings "soft tissue mass measuring up to 3.2 cm which most resembles an abnormal right level 1 B lymph node. This medially displaces but seems to be separate from the adjacent right submandibular gland.There is a larger spiculated and somewhat infiltrative appearing right lateral neck mass centered at the level 2 nodal station measuring up to 4.7 cm. This lesion is inseparable from the undersurface of the right sternocleidomastoid muscle, but does not appear to invade the subjacent right carotid space.  Associated small but asymmetrically enlarged and conspicuous right level 3 lymph nodes. No primary pharyngeal or laryngeal tumor identified."  The patient subsequently had incisional biopsy of the right upper neck lymph node on 12/19/2015 which is consistent with a low-grade follicular lymphoma. Patient has been referred for evaluation and management  for follicular lymphoma. She notes no other areas of obviously enlarged lymph nodes. No constitutional symptoms such as fevers, chills, night sweats, unexpected weight loss. Overall feels well and no differently than she felt 6 months ago.  INTERVAL HISTORY  [name] is here for her scheduled followup for follicular lymphoma. She is doing well overall. Labs are stable today. Calcium is 11 and a little higher from the last visit. She is on forteo as per PCP and this could certainly cause some hypercalcemia.. We discussed that this was likely the cause and that this is less likely to be related to lymphoma progression. Also she notes that she is off Prolia. She states her appetite is returning. She denies noticing enlarged peripheral lymph nodes. No recurrent neck :LN enlargement.  On review of systems, pt reports weight gain and denies abomninal pain, changes in BM, dysuria, and any other accompanying symptoms.  MEDICAL HISTORY:  Past Medical History:  Diagnosis Date  . Cancer ([place])    follicular lymphoma  . Cataract   . History of colon polyps    all colonic mucosa  . Hyperlipidemia    on lipitor  . 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 COLONOSCOPY     [id number]  . EYE SURGERY Bilateral    cataract removal  . 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 POLYPECTOMY     2001,2006-all colonic mucosa   . SUBMANDIBULAR GLAND EXCISION Right 12/19/2015   Procedure: BIOPSY OF  RIGHT SUBMANDIBULAR MASS;  Surgeon: Jerrell Belfast, MD;  Location: [facility] OR;  Service: ENT;  Laterality: Right;    SOCIAL HISTORY: Social History   Socioeconomic History  . Marital status: Married    Spouse name: Not on file  . Number of children: Not on file  . Years of education: Not on file  . Highest education level: Not on file    Social Needs  . Financial resource strain: Not on file  . Food insecurity - worry: Not on file  . Food insecurity - inability: Not on file  . Transportation needs - medical: Not on file  . Transportation needs - non-medical: Not on file  Occupational History  . Not on file  Tobacco Use  . Smoking status: Never Smoker  . Smokeless tobacco: Never Used  Substance and Sexual Activity  . Alcohol use: No    Alcohol/week: 0.0 oz  . Drug use: No  . Sexual activity: Not on file  Other Topics Concern  . Not on file  Social History Narrative  . Not on file    FAMILY HISTORY: Family History  Problem Relation Age of Onset  . Colon cancer Neg Hx   . Colon polyps Neg Hx   . Esophageal cancer Neg Hx   . Rectal cancer Neg Hx   . Stomach cancer Neg Hx     ALLERGIES:  is allergic to no known allergies.  MEDICATIONS:  Current Outpatient Medications  Medication Sig Dispense Refill  . aspirin 81 MG tablet Chew 81 mg by mouth daily.    [name] atorvastatin (LIPITOR) 20 MG tablet Take 20 mg by mouth.    . BD PEN NEEDLE NANO U/F 32G X 4 MM MISC     . Cholecalciferol (VITAMIN D) 2000 units CAPS Take 2,000 Units by mouth daily.    [name] FORTEO 600 MCG/2.4ML SOLN Inject 20 mcg into the skin daily.    . Multiple Vitamins-Minerals (MULTIVITAMIN WITH MINERALS) tablet Take 1 tablet by mouth.    . Omega-3 Fatty Acids (FISH OIL TRIPLE STRENGTH) 1400 MG CAPS Take 1 capsule by mouth daily.    [name] denosumab (PROLIA) 60 MG/ML SOLN injection Inject 60 mg into the skin every 6 (six) months. Administer in upper arm, thigh, or abdomen     No current facility-administered medications for this visit.     REVIEW OF SYSTEMS:  10 Point review of Systems was done is negative except as noted above.  PHYSICAL EXAMINATION:  ECOG PERFORMANCE STATUS: 1 - Symptomatic but completely ambulatory  . Vitals:   04/29/17 1009  BP: (!) 184/65  Pulse: 77  Resp: 18  Temp: 98.5 F (36.9 C)  SpO2: 97%   Filed Weights    04/29/17 1009  Weight: 115 lb 3.2 oz (52.3 kg)   .Body mass index is 21.77 kg/m.  GENERAL:alert, in no acute distress and comfortable SKIN: skin color, texture, turgor are normal, no rashes or significant lesions EYES: normal, conjunctiva are pink and non-injected, sclera clear OROPHARYNX: MMM NECK: supple, no JVD, thyroid normal size, non-tender, without nodularity LYMPH:  No palpable cervical or axillary LNadenoapathy. LUNGS: clear to auscultation with normal respiratory effort HEART: regular rate & rhythm,  no murmurs and no lower extremity edema ABDOMEN: abdomen soft, non-tender, normoactive bowel sounds , no palpable hepatosplenomegaly Musculoskeletal: no cyanosis of digits and no clubbing  PSYCH: alert & oriented x 3 with fluent speech NEURO: no focal motor/sensory deficits  LABORATORY DATA:  I have reviewed the data as listed  Component     Latest Ref Rng & Units 12/28/2016 04/29/2017  WBC Count     3.9 - 10.3 K/uL  8.5  RBC     3.70 - 5.45 MIL/uL  4.59  Hemoglobin     11.6 - 15.9 g/dL  13.8  HCT     34.8 - 46.6 %  42.3  MCV     79.5 - 101.0 fL  92.2  MCH     25.1 - 34.0 pg  30.1  MCHC     31.5 - 36.0 g/dL  32.6  RDW     11.2 - 16.1 %  14.5  Platelet Count     145 - 400 K/uL  216  Neutrophils     %  66  NEUT#     1.5 - 6.5 K/uL  5.6  Lymphocytes     %  25  Lymphocyte #     0.9 - 3.3 K/uL  2.1  Monocytes Relative     %  7  Monocyte #     0.1 - 0.9 K/uL  0.6  Eosinophil     %  1  Eosinophils Absolute     0.0 - 0.5 K/uL  0.1  Basophil     %  1  Basophils Absolute     0.0 - 0.1 K/uL  0.1  Sodium     136 - 145 mmol/L  143  Potassium     3.3 - 4.7 mmol/L  3.9  Chloride     98 - 109 mmol/L  106  CO2     22 - 29 mmol/L  29  Glucose     70 - 140 mg/dL  100  BUN     7 - 26 mg/dL  20  Creatinine     0.60 - 1.10 mg/dL  0.93  Calcium     8.4 - 10.4 mg/dL  11.0 (H)  Total Protein     6.4 - 8.3 g/dL  6.8  Albumin     3.5 - 5.0 g/dL  3.9  AST      5 - 34 U/L  28  ALT     0 - 55 U/L  26  Alkaline Phosphatase     40 - 150 U/L  66  Total Bilirubin     0.2 - 1.2 mg/dL  0.5  GFR, Est Non African American     >60 mL/min  57 (L)  GFR, Est African American     >60 mL/min  >60  Anion gap     3 - 11  8  Retic Ct Pct     0.7 - 2.1 %  1.0  RBC.     3.70 - 5.45 MIL/uL  4.59  Retic Count, Absolute     33.7 - 90.7 K/uL  45.9  LDH     125 - 245 U/L 174 192   . CBC Latest Ref Rng & Units 04/29/2017 12/28/2016 09/08/2016  WBC 3.9 - 10.3 10e3/uL - 8.8 8.7  Hemoglobin 11.6 - 15.9 g/dL - 14.1 13.9  Hematocrit 34.8 - 46.6 % 42.3 43.0 42.6  Platelets 145 - 400 10e3/uL - 225 276    . CMP Latest Ref Rng & Units 04/29/2017 12/28/2016 09/08/2016  Glucose 70 - 140 mg/dL 100 95 91  BUN 7 - 26 mg/dL 20 24.2 15.7  Creatinine 0.60 - 1.10 mg/dL 0.93 0.9 0.8  Sodium 136 - 145 mmol/L 143 143 142  Potassium 3.3 - 4.7 mmol/L  3.9 4.3 4.8  Chloride 98 - 109 mmol/L 106 - -  CO2 22 - 29 mmol/L '29 28 28  ' Calcium 8.4 - 10.4 mg/dL 11.0(H) 10.5(H) 10.3  Total Protein 6.4 - 8.3 g/dL 6.8 7.0 6.7  Total Bilirubin 0.2 - 1.2 mg/dL 0.5 0.62 0.48  Alkaline Phos 40 - 150 U/L 66 55 61  AST 5 - 34 U/L '28 25 22  ' ALT 0 - 55 U/L '26 19 18   ' . Lab Results  Component Value Date   LDH 174 12/28/2016         RADIOGRAPHIC STUDIES: I have personally reviewed the radiological images as listed and agreed with the findings in the report. No results found.  ASSESSMENT & PLAN:   83 year old very pleasant Caucasian lady with  #1 Stage IIIA low-grade follicular lymphoma ( grade 1-2/3) presenting with right upper neck lymphadenopathy. Patient imaging shows that this is at least a stage IIIA. No overt evidence of bone marrow involvement but bone marrow biopsy is not planned for additional staging based on patient preference and the fact that it will not change treatment at this time . LDH level within normal limits FLIPI score - intermediate risk with 5 year OS 78% and  ten-year overall survival 51% (from data in the pre-rituxan era) S/p ISRT to cervical LN  Disease.  Plan -Patient has no clinical, radiographic or lab evidence of lymphoma progression at this time. -no strong indication for systemic therapies for the patients follicular lymphoma at this time. -encouraged healthy and active lifestyle. -Repeat PET scan in 10 weeks   #2 history of lung carcinoid status post surgery in 2008. No concern with recurrence at this time.  #3 Hypercalcemia -likely from Forteo. Less likely from lymphoma. LDH WNL . No other clinical or lab findings suggestive of lymphoma progression at this time. Plan -f/u with PCP to discuss/evaluate hypercalcemia further/mx forteo.   PET/CT in 10 weeks RTC with Dr Irene Limbo in 12 weeks with labs Earlier if any acute new concerns.   Orders Placed This Encounter  Procedures  . NM PET Image Restag (PS) Skull Base To Thigh    Standing Status:   Future    Standing Expiration Date:   04/29/2018    Order Specific Question:   If indicated for the ordered procedure, I authorize the administration of a radiopharmaceutical per Radiology protocol    Answer:   Yes    Order Specific Question:   Preferred imaging location?    Answer:   [facility]    Order Specific Question:   Radiology Contrast Protocol - do NOT remove file path    Answer:   [email or web address]    Order Specific Question:   Reason for Exam additional comments    Answer:   f/u for follicular lymphoma , now with recurrent hypercalcemia (?lymphoma progression vs medication related)  . CBC & Diff and Retic    Standing Status:   Future    Standing Expiration Date:   04/29/2018  . CMP ([place] only)    Standing Status:   Future    Standing Expiration Date:   04/29/2018  . Ferritin    Standing Status:   Future    Standing Expiration Date:   04/29/2018  . Iron and TIBC    Standing Status:   Future    Standing Expiration Date:   04/29/2018      All of the patients and her husbands questions were answered to their apparent satisfaction. The patient knows  to call the clinic with any problems, questions or concerns.  I spent 20 minutes counseling the patient face to face. The total time spent in the appointment was 25 minutes and more than 50% was on counseling  regarding treatment options and the rationale prognosis and direct patient cares.  This document serves as a record of services personally performed by Sullivan Lone, MD. It was created on his behalf by Alean Rinne, a trained medical scribe. The creation of this record is based on the scribe's personal observations and the provider's statements to them.    .I have reviewed the above documentation for accuracy and completeness, and I agree with the above.   Sullivan Lone MD [place] AAHIVMS [facility] Hematology/Oncology Physician [facility]  (Office):       [phone] (Work cell):  [phone] (Fax):           [phone]

## 2017-04-29 ENCOUNTER — Telehealth: Payer: Self-pay | Admitting: Hematology

## 2017-04-29 ENCOUNTER — Encounter: Payer: Self-pay | Admitting: Hematology

## 2017-04-29 ENCOUNTER — Inpatient Hospital Stay: Payer: Medicare Other

## 2017-04-29 ENCOUNTER — Inpatient Hospital Stay: Payer: Medicare Other | Attending: Hematology | Admitting: Hematology

## 2017-04-29 VITALS — BP 184/65 | HR 77 | Temp 98.5°F | Resp 18 | Ht 61.0 in | Wt 115.2 lb

## 2017-04-29 DIAGNOSIS — C8201 Follicular lymphoma grade I, lymph nodes of head, face, and neck: Secondary | ICD-10-CM | POA: Insufficient documentation

## 2017-04-29 DIAGNOSIS — Z85110 Personal history of malignant carcinoid tumor of bronchus and lung: Secondary | ICD-10-CM | POA: Insufficient documentation

## 2017-04-29 LAB — CBC WITH DIFFERENTIAL (CANCER CENTER ONLY)
Basophils Absolute: 0.1 10*3/uL (ref 0.0–0.1)
Basophils Relative: 1 %
Eosinophils Absolute: 0.1 10*3/uL (ref 0.0–0.5)
Eosinophils Relative: 1 %
HEMATOCRIT: 42.3 % (ref 34.8–46.6)
HEMOGLOBIN: 13.8 g/dL (ref 11.6–15.9)
LYMPHS ABS: 2.1 10*3/uL (ref 0.9–3.3)
LYMPHS PCT: 25 %
MCH: 30.1 pg (ref 25.1–34.0)
MCHC: 32.6 g/dL (ref 31.5–36.0)
MCV: 92.2 fL (ref 79.5–101.0)
MONOS PCT: 7 %
Monocytes Absolute: 0.6 10*3/uL (ref 0.1–0.9)
NEUTROS PCT: 66 %
Neutro Abs: 5.6 10*3/uL (ref 1.5–6.5)
Platelet Count: 216 10*3/uL (ref 145–400)
RBC: 4.59 MIL/uL (ref 3.70–5.45)
RDW: 14.5 % (ref 11.2–16.1)
WBC: 8.5 10*3/uL (ref 3.9–10.3)

## 2017-04-29 LAB — COMPREHENSIVE METABOLIC PANEL
ALT: 26 U/L (ref 0–55)
AST: 28 U/L (ref 5–34)
Albumin: 3.9 g/dL (ref 3.5–5.0)
Alkaline Phosphatase: 66 U/L (ref 40–150)
Anion gap: 8 (ref 3–11)
BUN: 20 mg/dL (ref 7–26)
CHLORIDE: 106 mmol/L (ref 98–109)
CO2: 29 mmol/L (ref 22–29)
Calcium: 11 mg/dL — ABNORMAL HIGH (ref 8.4–10.4)
Creatinine, Ser: 0.93 mg/dL (ref 0.60–1.10)
GFR, EST NON AFRICAN AMERICAN: 57 mL/min — AB (ref >60)
Glucose, Bld: 100 mg/dL (ref 70–140)
POTASSIUM: 3.9 mmol/L (ref 3.3–4.7)
SODIUM: 143 mmol/L (ref 136–145)
Total Bilirubin: 0.5 mg/dL (ref 0.2–1.2)
Total Protein: 6.8 g/dL (ref 6.4–8.3)

## 2017-04-29 LAB — RETICULOCYTES
RBC.: 4.59 MIL/uL (ref 3.70–5.45)
Retic Count, Absolute: 45.9 10*3/uL (ref 33.7–90.7)
Retic Ct Pct: 1 % (ref 0.7–2.1)

## 2017-04-29 LAB — LACTATE DEHYDROGENASE: LDH: 192 U/L (ref 125–245)

## 2017-04-29 NOTE — Patient Instructions (Signed)
Thank you for choosing [facility] to provide your oncology and hematology care.  To afford each patient quality time with our providers, please arrive 30 minutes before your scheduled appointment time.  If you arrive late for your appointment, you may be asked to reschedule.  We strive to give you quality time with our providers, and arriving late affects you and other patients whose appointments are after yours.   If you are a no show for multiple scheduled visits, you may be dismissed from the clinic at the providers discretion.    Again, thank you for choosing [facility], our hope is that these requests will decrease the amount of time that you wait before being seen by our physicians.  ______________________________________________________________________  Should you have questions after your visit to the [facility], please contact our office at [phone] between the hours of 8:30 and 4:30 p.m.    Voicemails left after 4:30p.m will not be returned until the following business day.    For prescription refill requests, please have your pharmacy contact us directly.  Please also try to allow 48 hours for prescription requests.    Please contact the scheduling department for questions regarding scheduling.  For scheduling of procedures such as PET scans, CT scans, MRI, Ultrasound, etc please contact central scheduling at [phone].    Resources For Cancer Patients and Caregivers:   Oncolink.org:  A wonderful resource for patients and healthcare providers for information regarding your disease, ways to tract your treatment, what to expect, etc.     American Cancer Society:  800-227-2345  Can help patients locate various types of support and financial assistance  Cancer Care: 1-800-813-HOPE (4673) Provides financial assistance, online support groups, medication/co-pay assistance.    [place] DSS:  336-641-3447 Where to apply for food  stamps, Medicaid, and utility assistance  Medicare Rights Center: 800-333-4114 Helps people with Medicare understand their rights and benefits, navigate the Medicare system, and secure the quality healthcare they deserve  SCAT: 336-333-6589 [place] [employer]'s shared-ride transportation service for eligible riders who have a disability that prevents them from riding the fixed route bus.    For additional information on assistance programs please contact our social worker:   Grier Hock/Abigail Elmore:  [phone]            

## 2017-04-29 NOTE — Telephone Encounter (Signed)
Scheduled appt per 1/11 los - Patient did not need calender - printed AVS - Patient is aware of appts - Central radiology to contact patient with PET schedule.

## 2017-05-18 ENCOUNTER — Other Ambulatory Visit: Payer: Self-pay | Admitting: Hematology

## 2017-05-18 ENCOUNTER — Telehealth: Payer: Self-pay

## 2017-05-18 NOTE — Telephone Encounter (Signed)
Spoke with pt regarding lab results. LDH normal per Dr. Irene Limbo, but he would recommend f/u of Ca labs via PCP. Pt continuing to take forteo per PCP, and this may be the cause of elevated Ca per Dr. Irene Limbo. Pt aware of this. This RN to contact Dr. Lambert Mody office and speak with RN, Gay Filler, tomorrow to relay information and fax recent labs. [facility] then to get back in touch with pt regarding decision to recheck labs or not. Pt verbalized understanding and agreement with plan.

## 2017-05-19 ENCOUNTER — Telehealth: Payer: Self-pay

## 2017-05-19 NOTE — Telephone Encounter (Signed)
Left VM for Gay Filler, RN with Dr. Concha Pyo at [facility] regarding pt lab work. Received return call and spoke with Gay Filler, RN about patient's elevated Ca level and potential need to do f/u lab work and re-evaluate use of forteo. Lab work from 04/29/17 faxed to [phone] with instructions. Confirmed fax receipt 04/29/17 at 1124. Gay Filler, RN to notify pt of need to f/u or not per pt request.

## 2017-06-09 DIAGNOSIS — L812 Freckles: Secondary | ICD-10-CM | POA: Diagnosis not present

## 2017-06-09 DIAGNOSIS — L821 Other seborrheic keratosis: Secondary | ICD-10-CM | POA: Diagnosis not present

## 2017-06-09 DIAGNOSIS — L570 Actinic keratosis: Secondary | ICD-10-CM | POA: Diagnosis not present

## 2017-06-09 DIAGNOSIS — D1722 Benign lipomatous neoplasm of skin and subcutaneous tissue of left arm: Secondary | ICD-10-CM | POA: Diagnosis not present

## 2017-06-09 DIAGNOSIS — Z85828 Personal history of other malignant neoplasm of skin: Secondary | ICD-10-CM | POA: Diagnosis not present

## 2017-06-09 DIAGNOSIS — D1801 Hemangioma of skin and subcutaneous tissue: Secondary | ICD-10-CM | POA: Diagnosis not present

## 2017-06-14 DIAGNOSIS — Z1231 Encounter for screening mammogram for malignant neoplasm of breast: Secondary | ICD-10-CM | POA: Diagnosis not present

## 2017-07-08 ENCOUNTER — Encounter (HOSPITAL_COMMUNITY)
Admission: RE | Admit: 2017-07-08 | Discharge: 2017-07-08 | Disposition: A | Discharge status: Home / Self Care | Payer: Medicare Other | Source: Ambulatory Visit | Attending: Hematology | Admitting: Hematology

## 2017-07-08 DIAGNOSIS — C8201 Follicular lymphoma grade I, lymph nodes of head, face, and neck: Secondary | ICD-10-CM | POA: Insufficient documentation

## 2017-07-08 DIAGNOSIS — C8290 Follicular lymphoma, unspecified, unspecified site: Secondary | ICD-10-CM | POA: Diagnosis not present

## 2017-07-08 LAB — GLUCOSE, CAPILLARY: Glucose-Capillary: 96 mg/dL (ref 65–99)

## 2017-07-08 IMAGING — CT NM PET TUM IMG RESTAG (PS) SKULL BASE T - THIGH
1 of 8 series · 1 of 25 positions shown · non-contrast
Comparison: PET-CT 12/07/2016

CLINICAL DATA: Subsequent treatment strategy for follicular
lymphoma..

EXAM:
NUCLEAR MEDICINE PET SKULL BASE TO THIGH
TECHNIQUE: 5.9 mCi F-18 FDG was injected intravenously. Full-ring PET imaging
was performed from the skull base to thigh after the radiotracer. CT
data was obtained and used for attenuation correction and anatomic
localization.
Fasting blood glucose: 96 mg/dl
Mediastinal blood pool activity: SUV max

[Series 4: ct sk_thigh 5.0 b31f · axial · 5.0mm · 0.85mm/px · 1 of 196 slices shown]
[im 196/196  brain]
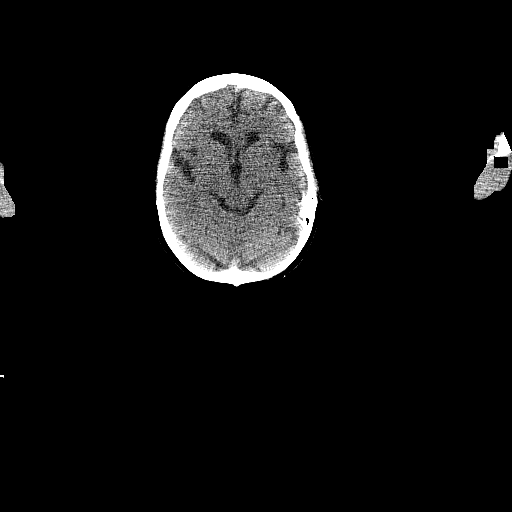

[1 of 25 positions shown; findings below may reference images not displayed]

FINDINGS: Liver activity: SUV max

NECK: No hypermetabolic lymph nodes in the neck.

Incidental CT findings: none

CHEST: No hypermetabolic mediastinal or hilar nodes. No suspicious
pulmonary nodules on the CT scan.

Incidental CT findings: A postsurgical change in the RIGHT lower
lobe is stable.

ABDOMEN/PELVIS: Peritoneal thickening along the inferior aspect of
the RIGHT pericolic gutter measures 3.5 by 1.8 cm and has associated
moderate radiotracer activity with SUV max equal 6.3. This is above
liver activity (SUV max equal 2.8). Band of thickening is increased
activity from most recent comparison exam (SUV max equal 5.2)
similar in size.

There is a central mesenteric thickening in the LEFT abdomen
measuring 3.1 by 1.3 cm. This not changed in size from comparison
PET-CT but does have increased metabolic activity SUV max equal 4.1.
Several mesenteric lymph nodes in the same region do not have
significant metabolic activity

New noted no new adenopathy in the abdomen pelvis.

No abnormal activity in liver spleen.  Adrenal glands normal.

Incidental CT findings: Uterus and necks are normal.

SKELETON: No aggressive osseous lesion.  No hypermetabolic lesions

Incidental CT findings: none
IMPRESSION: 1. Persistent metabolic activity of peritoneal and mesenteric
masslike thickening with activity above liver activity ( [HOSPITAL]
4). Findings are concerning for residual lymphoma..
2. No new or enlarged nodes or peritoneal masses.

## 2017-07-08 MED ORDER — FLUDEOXYGLUCOSE F - 18 (FDG) INJECTION
5.9000 | Freq: Once | INTRAVENOUS | Status: AC | PRN
  Administered 2017-07-08: 5.9 via INTRAVENOUS

## 2017-07-20 NOTE — Progress Notes (Signed)
[name]    HEMATOLOGY/ONCOLOGY CLINIC NOTE  Date of Service: 07/21/17  Patient Care Team: Deland Pretty, MD as PCP - General (Internal Medicine)  CHIEF COMPLAINTS/PURPOSE OF CONSULTATION:   F/u for continued management of follicular lymphoma  HISTORY OF PRESENTING ILLNESS:   [name] [name] is a wonderful 83 y.o. female who has been referred to [place] by Dr .Deland Pretty, MD for evaluation and management of newly diagnosed follicular lymphoma.  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 Laverta Baltimore who is a retired Publishing rights manager from [place], [place].  She presented to her primary care physician with 3 months of asymptomatic fullness/swelling in her right upper neck with no overt symptoms of dental pain or swelling, fevers, chills or night sweats.  The swelling was painless and gradually growing.  She was referred to Dr. Jerrell Belfast (ENT) for further evaluation and had a CT of the neck on 12/08/2015 which showed findings "soft tissue mass measuring up to 3.2 cm which most resembles an abnormal right level 1 B lymph node. This medially displaces but seems to be separate from the adjacent right submandibular gland.There is a larger spiculated and somewhat infiltrative appearing right lateral neck mass centered at the level 2 nodal station measuring up to 4.7 cm. This lesion is inseparable from the undersurface of the right sternocleidomastoid muscle, but does not appear to invade the subjacent right carotid space.  Associated small but asymmetrically enlarged and conspicuous right level 3 lymph nodes. No primary pharyngeal or laryngeal tumor identified."  The patient subsequently had incisional biopsy of the right upper neck lymph node on 12/19/2015 which is consistent with a low-grade follicular lymphoma. Patient has been referred for  evaluation and management for follicular lymphoma. She notes no other areas of obviously enlarged lymph nodes. No constitutional symptoms such as fevers, chills, night sweats, unexpected weight loss. Overall feels well and no differently than she felt 6 months ago.  INTERVAL HISTORY  [name] [name] is here for her scheduled followup for follicular lymphoma. The patient's last visit with [place] was on 04/29/17. She is accompanied today by her husband. The pt reports that she is doing well overall and is anticipating some exciting vacations in the next few months.   The pt reports that she has no new concerns and has not felt any differently since her last visit. She notes that she will be continuing Forteo until June 2019. She notes that she drinks lots of water and urinates frequently. She notes that Dr. Shelia Media her PCP is continuing to watch her calcium levels and she will see him in August. She notes that she takes Vitamin D replacement as well.   Of note since the patient's last visit, pt has had PET/CT completed on 07/08/17 with results revealing 1. Persistent metabolic activity of peritoneal and mesenteric masslike thickening with activity above liver activity ( Deauville 4). Findings are concerning for residual lymphoma. 2. No new or enlarged nodes or peritoneal masses.  Lab results today (07/21/17) of CBC, CMP, and Reticulocytes is as follows: all values are WNL except for RDW at 14.6, Neutro Abs at 6.6, Calcium at 11.4.  Previously had normalized when she was briefly off forteo - confirming that this is the likely cause.  On review of systems, pt reports stable weight, energy levels, scoliosis-related back pain, and denies abdominal discomfort,  abdominal pain, fevers, chills, night sweats, weight loss, bone pains, and any other symptoms.  MEDICAL HISTORY:  Past Medical History:  Diagnosis Date  . Cancer ([place])    follicular lymphoma  . Cataract   . History of colon polyps    all colonic mucosa  .  Hyperlipidemia    on lipitor  . 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 COLONOSCOPY     [id number]  . EYE SURGERY Bilateral    cataract removal  . 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 POLYPECTOMY     2001,2006-all colonic mucosa   . SUBMANDIBULAR GLAND EXCISION Right 12/19/2015   Procedure: BIOPSY OF RIGHT SUBMANDIBULAR MASS;  Surgeon: Jerrell Belfast, MD;  Location: [facility] OR;  Service: ENT;  Laterality: Right;    SOCIAL HISTORY: Social History   Socioeconomic History  . Marital status: Married    Spouse name: Not on file  . Number of children: Not on file  . Years of education: Not on file  . Highest education level: Not on file  Occupational History  . Not on file  Social Needs  . Financial resource strain: Not on file  . Food insecurity:    Worry: Not on file    Inability: Not on file  . Transportation needs:    Medical: Not on file    Non-medical: Not on file  Tobacco Use  . Smoking status: Never Smoker  . Smokeless tobacco: Never Used  Substance and Sexual Activity  . Alcohol use: No    Alcohol/week: 0.0 oz  . Drug use: No  . Sexual activity: Not on file  Lifestyle  . Physical activity:    Days per week: Not on file    Minutes per session: Not on file  . Stress: Not on file  Relationships  . Social connections:    Talks on phone: Not on file    Gets together: Not on file    Attends religious service: Not on file    Active member of club or organization: Not on file    Attends meetings of clubs or organizations: Not on file    Relationship status: Not on file  . Intimate partner violence:    Fear of current or ex partner: Not on file    Emotionally abused: Not on file    Physically abused: Not on file    Forced sexual activity: Not on file  Other Topics Concern  . Not on  file  Social History Narrative  . Not on file    FAMILY HISTORY: Family History  Problem Relation Age of Onset  . Colon cancer Neg Hx   . Colon polyps Neg Hx   . Esophageal cancer Neg Hx   . Rectal cancer Neg Hx   . Stomach cancer Neg Hx     ALLERGIES:  is allergic to no known allergies.  MEDICATIONS:  Current Outpatient Medications  Medication Sig Dispense Refill  . aspirin 81 MG tablet Chew 81 mg by mouth daily.    [name] atorvastatin (LIPITOR) 20 MG tablet Take 20 mg by mouth.    . BD PEN NEEDLE NANO U/F 32G X 4 MM MISC     . Cholecalciferol (VITAMIN D) 2000 units CAPS Take 2,000 Units by mouth daily.    [name] FORTEO 600 MCG/2.4ML SOLN Inject 20 mcg  into the skin daily.    . Multiple Vitamins-Minerals (MULTIVITAMIN WITH MINERALS) tablet Take 1 tablet by mouth.    . Omega-3 Fatty Acids (FISH OIL TRIPLE STRENGTH) 1400 MG CAPS Take 1 capsule by mouth daily.     No current facility-administered medications for this visit.     REVIEW OF SYSTEMS:  10 Point review of Systems was done is negative except as noted above.   PHYSICAL EXAMINATION:  ECOG PERFORMANCE STATUS: 1 - Symptomatic but completely ambulatory  . Vitals:   07/21/17 1154  BP: (!) 147/83  Pulse: 80  Resp: 20  Temp: 98.6 F (37 C)  SpO2: 96%   Filed Weights   07/21/17 1154  Weight: 115 lb 4.8 oz (52.3 kg)   .Body mass index is 21.79 kg/m.  GENERAL:alert, in no acute distress and comfortable SKIN: no acute rashes, no significant lesions EYES: conjunctiva are pink and non-injected, sclera anicteric OROPHARYNX: MMM, no exudates, no oropharyngeal erythema or ulceration NECK: supple, no JVD LYMPH:  no palpable lymphadenopathy in the cervical, axillary or inguinal regions LUNGS: clear to auscultation b/l with normal respiratory effort HEART: regular rate & rhythm ABDOMEN:  normoactive bowel sounds , non tender, not distended. Extremity: no pedal edema PSYCH: alert & oriented x 3 with fluent speech NEURO: no  focal motor/sensory deficits   LABORATORY DATA:  I have reviewed the data as listed  CBC Latest Ref Rng & Units 07/21/2017 04/29/2017 12/28/2016  WBC 3.9 - 10.3 K/uL 9.5 8.5 8.8  Hemoglobin 11.6 - 15.9 g/dL - - 14.1  Hematocrit 34.8 - 46.6 % 41.3 42.3 43.0  Platelets 145 - 400 K/uL 221 216 225  HGB 13.6  . CMP Latest Ref Rng & Units 07/21/2017 04/29/2017 12/28/2016  Glucose 70 - 140 mg/dL 84 100 95  BUN 7 - 26 mg/dL 18 20 24.2  Creatinine 0.60 - 1.10 mg/dL 0.90 0.93 0.9  Sodium 136 - 145 mmol/L 142 143 143  Potassium 3.5 - 5.1 mmol/L 4.2 3.9 4.3  Chloride 98 - 109 mmol/L 106 106 -  CO2 22 - 29 mmol/L _0 Calcium 8.4 - 10.4 mg/dL 11.4(H) 11.0(H) 10.5(H)  Total Protein 6.4 - 8.3 g/dL 6.9 6.8 7.0  Total Bilirubin 0.2 - 1.2 mg/dL 0.6 0.5 0.62  Alkaline Phos 40 - 150 U/L 76 66 55  AST 5 - 34 U/L _1 ALT 0 - 55 U/L _2 . Lab Results  Component Value Date   LDH 192 04/29/2017         RADIOGRAPHIC STUDIES: I have personally reviewed the radiological images as listed and agreed with the findings in the report. Nm Pet Image Restag (ps) Skull Base To Thigh  Result Date: 07/08/2017 CLINICAL DATA:  Subsequent treatment strategy for follicular lymphoma. EXAM: NUCLEAR MEDICINE PET SKULL BASE TO THIGH TECHNIQUE: 5.9 mCi F-18 FDG was injected intravenously. Full-ring PET imaging was performed from the skull base to thigh after the radiotracer. CT data was obtained and used for attenuation correction and anatomic localization. Fasting blood glucose: 96 mg/dl Mediastinal blood pool activity: SUV max 1.7 COMPARISON:  PET-CT 12/07/2016 FINDINGS: Liver activity: SUV max 2.8 NECK: No hypermetabolic lymph nodes in the neck. Incidental CT findings: none CHEST: No hypermetabolic mediastinal or hilar nodes. No suspicious pulmonary nodules on the CT scan. Incidental CT findings: A postsurgical change in the RIGHT lower lobe is stable. ABDOMEN/PELVIS: Peritoneal thickening along the inferior  aspect of the RIGHT pericolic gutter measures 3.5  by 1.8 cm and has associated moderate radiotracer activity with SUV max equal 6.3. This is above liver activity (SUV max equal 2.8). Band of thickening is increased activity from most recent comparison exam (SUV max equal 5.2) similar in size. There is a central mesenteric thickening in the LEFT abdomen measuring 3.1 by 1.3 cm. This not changed in size from comparison PET-CT but does have increased metabolic activity SUV max equal 4.1. Several mesenteric lymph nodes in the same region do not have significant metabolic activity New noted no new adenopathy in the abdomen pelvis. No abnormal activity in liver spleen.  Adrenal glands normal. Incidental CT findings: Uterus and necks are normal. SKELETON: No aggressive osseous lesion.  No hypermetabolic lesions Incidental CT findings: none IMPRESSION: 1. Persistent metabolic activity of peritoneal and mesenteric masslike thickening with activity above liver activity ( Deauville 4). Findings are concerning for residual lymphoma. 2. No new or enlarged nodes or peritoneal masses. Electronically Signed   By: Suzy Bouchard M.D.   On: 07/08/2017 14:47    ASSESSMENT & PLAN:   83 year old very pleasant Caucasian lady with  #1 Stage IIIA low-grade follicular lymphoma ( grade 1-2/3) presenting with right upper neck lymphadenopathy. Patient imaging shows that this is at least a stage III A with mesenteric involvement as well. No overt evidence of bone marrow involvement but bone marrow biopsy is not planned for additional staging based on patient preference and the fact that it will not change treatment at this time . LDH level within normal limits FLIPI score - intermediate risk with 5 year OS 78% and ten-year overall survival 51% (from data in the pre-rituxan era) S/p ISRT to cervical LN  Disease.  Plan -Discussed pt labwork today 07/21/17; blood counts are stable, normal Hgb, WBC and platelets. Calcium levels higher  at 11.4 which is likely confounded by Forteo.  -Reviewed 07/08/17 PET/CT which showed slightly increasing activity levels in the mesentery.  -These PET/CT findings warrant a closer watch on the pt, bearing in mind that her calcium levels aren't able to serve as a clear marker in the presence of Forteo. -We will see pt back in 3 months  -Patient has no clinical indication for lymphoma progression at this time.  -no strong indication for systemic therapies for the patients follicular lymphoma at this time. -encouraged healthy and active lifestyle.  #2 history of lung carcinoid status post surgery in 2008. No concern with recurrence at this time.  #3 Hypercalcemia -likely from Forteo. Less likely from lymphoma. LDH WNL . No other clinical or lab findings suggestive of lymphoma progression at this time. Previously had normalized briefly when she was off the forteo but was placed back on this. Plan -Recommend that she sees her PCP Dr. Shelia Media in another month or less, for him to monitor her increased calcium levels at 11.4 today 07/21/17.    RTC with Dr Irene Limbo in 3 months with labs    No orders of the defined types were placed in this encounter.   All of the patients and her husbands questions were answered to their apparent satisfaction. The patient knows to call the clinic with any problems, questions or concerns.  . The total time spent in the appointment was 20 minutes and more than 50% was on counseling and direct patient cares.    Sullivan Lone MD [place] AAHIVMS [facility] Hematology/Oncology Physician [facility]  (Office):       [phone] (Work cell):  [phone] (Fax):  [phone]  This document serves as a record of services personally performed by Sullivan Lone, MD. It was created on his behalf by Baldwin Jamaica, a trained medical scribe. The creation of this record is based on the scribe's personal observations and the provider's statements to them.   .I have  reviewed the above documentation for accuracy and completeness, and I agree with the above. Brunetta Genera MD [name]

## 2017-07-21 ENCOUNTER — Inpatient Hospital Stay: Payer: Medicare Other | Attending: Hematology | Admitting: Hematology

## 2017-07-21 ENCOUNTER — Inpatient Hospital Stay: Payer: Medicare Other

## 2017-07-21 ENCOUNTER — Telehealth: Payer: Self-pay | Admitting: Hematology

## 2017-07-21 VITALS — BP 147/83 | HR 80 | Temp 98.6°F | Resp 20 | Ht 61.0 in | Wt 115.3 lb

## 2017-07-21 DIAGNOSIS — E785 Hyperlipidemia, unspecified: Secondary | ICD-10-CM | POA: Insufficient documentation

## 2017-07-21 DIAGNOSIS — M810 Age-related osteoporosis without current pathological fracture: Secondary | ICD-10-CM | POA: Insufficient documentation

## 2017-07-21 DIAGNOSIS — Z7982 Long term (current) use of aspirin: Secondary | ICD-10-CM | POA: Diagnosis not present

## 2017-07-21 DIAGNOSIS — Z85110 Personal history of malignant carcinoid tumor of bronchus and lung: Secondary | ICD-10-CM

## 2017-07-21 DIAGNOSIS — C8201 Follicular lymphoma grade I, lymph nodes of head, face, and neck: Secondary | ICD-10-CM | POA: Diagnosis not present

## 2017-07-21 DIAGNOSIS — Z79899 Other long term (current) drug therapy: Secondary | ICD-10-CM | POA: Diagnosis not present

## 2017-07-21 DIAGNOSIS — M419 Scoliosis, unspecified: Secondary | ICD-10-CM | POA: Insufficient documentation

## 2017-07-21 DIAGNOSIS — M549 Dorsalgia, unspecified: Secondary | ICD-10-CM | POA: Diagnosis not present

## 2017-07-21 DIAGNOSIS — Z86010 Personal history of colonic polyps: Secondary | ICD-10-CM | POA: Diagnosis not present

## 2017-07-21 LAB — CBC WITH DIFFERENTIAL (CANCER CENTER ONLY)
BASOS ABS: 0.1 10*3/uL (ref 0.0–0.1)
Basophils Relative: 1 %
EOS ABS: 0.1 10*3/uL (ref 0.0–0.5)
EOS PCT: 1 %
HCT: 41.3 % (ref 34.8–46.6)
Hemoglobin: 13.6 g/dL (ref 11.6–15.9)
LYMPHS ABS: 1.9 10*3/uL (ref 0.9–3.3)
Lymphocytes Relative: 20 %
MCH: 29.9 pg (ref 25.1–34.0)
MCHC: 32.9 g/dL (ref 31.5–36.0)
MCV: 90.8 fL (ref 79.5–101.0)
Monocytes Absolute: 0.8 10*3/uL (ref 0.1–0.9)
Monocytes Relative: 8 %
Neutro Abs: 6.6 10*3/uL — ABNORMAL HIGH (ref 1.5–6.5)
Neutrophils Relative %: 70 %
PLATELETS: 221 10*3/uL (ref 145–400)
RBC: 4.55 MIL/uL (ref 3.70–5.45)
RDW: 14.6 % — AB (ref 11.2–14.5)
WBC: 9.5 10*3/uL (ref 3.9–10.3)

## 2017-07-21 LAB — IRON AND TIBC
Iron: 98 ug/dL (ref 41–142)
SATURATION RATIOS: 34 % (ref 21–57)
TIBC: 286 ug/dL (ref 236–444)
UIBC: 188 ug/dL

## 2017-07-21 LAB — CMP (CANCER CENTER ONLY)
ALK PHOS: 76 U/L (ref 40–150)
ALT: 24 U/L (ref 0–55)
ANION GAP: 8 (ref 3–11)
AST: 27 U/L (ref 5–34)
Albumin: 4 g/dL (ref 3.5–5.0)
BILIRUBIN TOTAL: 0.6 mg/dL (ref 0.2–1.2)
BUN: 18 mg/dL (ref 7–26)
CALCIUM: 11.4 mg/dL — AB (ref 8.4–10.4)
CO2: 28 mmol/L (ref 22–29)
Chloride: 106 mmol/L (ref 98–109)
Creatinine: 0.9 mg/dL (ref 0.60–1.10)
GFR, EST NON AFRICAN AMERICAN: 59 mL/min — AB (ref >60)
Glucose, Bld: 84 mg/dL (ref 70–140)
Potassium: 4.2 mmol/L (ref 3.5–5.1)
SODIUM: 142 mmol/L (ref 136–145)
TOTAL PROTEIN: 6.9 g/dL (ref 6.4–8.3)

## 2017-07-21 LAB — RETICULOCYTES
RBC.: 4.55 MIL/uL (ref 3.70–5.45)
RETIC CT PCT: 1.3 % (ref 0.7–2.1)
Retic Count, Absolute: 59.2 10*3/uL (ref 33.7–90.7)

## 2017-07-21 LAB — FERRITIN: Ferritin: 134 ng/mL (ref 9–269)

## 2017-07-21 NOTE — Telephone Encounter (Signed)
Appointments scheduled AVS/Calendar printed per 4/4 los

## 2017-07-27 DIAGNOSIS — C8209 Follicular lymphoma grade I, extranodal and solid organ sites: Secondary | ICD-10-CM | POA: Diagnosis not present

## 2017-07-27 DIAGNOSIS — M810 Age-related osteoporosis without current pathological fracture: Secondary | ICD-10-CM | POA: Diagnosis not present

## 2017-07-27 DIAGNOSIS — C8203 Follicular lymphoma grade I, intra-abdominal lymph nodes: Secondary | ICD-10-CM | POA: Diagnosis not present

## 2017-07-27 DIAGNOSIS — E559 Vitamin D deficiency, unspecified: Secondary | ICD-10-CM | POA: Diagnosis not present

## 2017-08-17 DIAGNOSIS — M810 Age-related osteoporosis without current pathological fracture: Secondary | ICD-10-CM | POA: Diagnosis not present

## 2017-08-17 DIAGNOSIS — C8203 Follicular lymphoma grade I, intra-abdominal lymph nodes: Secondary | ICD-10-CM | POA: Diagnosis not present

## 2017-08-17 DIAGNOSIS — C8209 Follicular lymphoma grade I, extranodal and solid organ sites: Secondary | ICD-10-CM | POA: Diagnosis not present

## 2017-10-26 NOTE — Progress Notes (Signed)
[name] [name]    HEMATOLOGY/ONCOLOGY CLINIC NOTE  Date of Service: 10/27/17    Patient Care Team: Deland Pretty, MD as PCP - General (Internal Medicine)  CHIEF COMPLAINTS/PURPOSE OF CONSULTATION:   F/u for continued management of follicular lymphoma  HISTORY OF PRESENTING ILLNESS:   [name] [name] is a wonderful 83 y.o. female who has been referred to [place] by Dr .Deland Pretty, MD for evaluation and management of newly diagnosed follicular lymphoma.  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 Laverta Baltimore who is a retired Publishing rights manager from [place], [place].  She presented to her primary care physician with 3 months of asymptomatic fullness/swelling in her right upper neck with no overt symptoms of dental pain or swelling, fevers, chills or night sweats.  The swelling was painless and gradually growing.  She was referred to Dr. Jerrell Belfast (ENT) for further evaluation and had a CT of the neck on 12/08/2015 which showed findings "soft tissue mass measuring up to 3.2 cm which most resembles an abnormal right level 1 B lymph node. This medially displaces but seems to be separate from the adjacent right submandibular gland.There is a larger spiculated and somewhat infiltrative appearing right lateral neck mass centered at the level 2 nodal station measuring up to 4.7 cm. This lesion is inseparable from the undersurface of the right sternocleidomastoid muscle, but does not appear to invade the subjacent right carotid space.  Associated small but asymmetrically enlarged and conspicuous right level 3 lymph nodes. No primary pharyngeal or laryngeal tumor identified."  The patient subsequently had incisional biopsy of the right upper neck lymph node on 12/19/2015 which is consistent with a low-grade follicular lymphoma. Patient has been referred for  evaluation and management for follicular lymphoma. She notes no other areas of obviously enlarged lymph nodes. No constitutional symptoms such as fevers, chills, night sweats, unexpected weight loss. Overall feels well and no differently than she felt 6 months ago.  INTERVAL HISTORY  [name] is here for her scheduled followup for follicular lymphoma. The patient's last visit with [place] was on 07/21/17. She is accompanied today by her husband. The pt reports that she is doing well overall.   The pt reports that she saw an endocrinologist twice in the interim for discussion about her calcium levels in the setting of Forteo. She notes that she will be seeing her PCP Dr Shelia Media in one month. She notes that her Vitamin D levels have been adequate and will have these rechecked in August.   She feels well with good energy levels.   Lab results today (10/27/17) of CBC w/diff, CMP, and Reticulocytes is as follows: all values are WNL except for Potassium at 5.4, GFR at 52. LDH 10/27/17 is WNL at 183  On review of systems, pt reports good energy levels, and denies fevers, chills, night sweats, unexpected weight loss, noticing any new lumps or bumps, new/different back pains, abdominal pains, leg swelling, and any other symptoms.   MEDICAL HISTORY:  Past Medical History:  Diagnosis Date  . Cancer ([place])    follicular lymphoma  . Cataract   . History of colon polyps    all colonic mucosa  . Hyperlipidemia    on lipitor  . 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 COLONOSCOPY     [id number]  . EYE SURGERY Bilateral    cataract removal  . 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 POLYPECTOMY     2001,2006-all colonic mucosa   . SUBMANDIBULAR GLAND EXCISION Right 12/19/2015   Procedure: BIOPSY OF RIGHT SUBMANDIBULAR MASS;   Surgeon: Jerrell Belfast, MD;  Location: [facility] OR;  Service: ENT;  Laterality: Right;    SOCIAL HISTORY: Social History   Socioeconomic History  . Marital status: Married    Spouse name: Not on file  . Number of children: Not on file  . Years of education: Not on file  . Highest education level: Not on file  Occupational History  . Not on file  Social Needs  . Financial resource strain: Not on file  . Food insecurity:    Worry: Not on file    Inability: Not on file  . Transportation needs:    Medical: Not on file    Non-medical: Not on file  Tobacco Use  . Smoking status: Never Smoker  . Smokeless tobacco: Never Used  Substance and Sexual Activity  . Alcohol use: No    Alcohol/week: 0.0 oz  . Drug use: No  . Sexual activity: Not on file  Lifestyle  . Physical activity:    Days per week: Not on file    Minutes per session: Not on file  . Stress: Not on file  Relationships  . Social connections:    Talks on phone: Not on file    Gets together: Not on file    Attends religious service: Not on file    Active member of club or organization: Not on file    Attends meetings of clubs or organizations: Not on file    Relationship status: Not on file  . Intimate partner violence:    Fear of current or ex partner: Not on file    Emotionally abused: Not on file    Physically abused: Not on file    Forced sexual activity: Not on file  Other Topics Concern  . Not on file  Social History Narrative  . Not on file    FAMILY HISTORY: Family History  Problem Relation Age of Onset  . Colon cancer Neg Hx   . Colon polyps Neg Hx   . Esophageal cancer Neg Hx   . Rectal cancer Neg Hx   . Stomach cancer Neg Hx     ALLERGIES:  is allergic to no known allergies.  MEDICATIONS:  Current Outpatient Medications  Medication Sig Dispense Refill  . aspirin 81 MG tablet Chew 81 mg by mouth daily.    [name] [name] atorvastatin (LIPITOR) 20 MG tablet Take 20 mg by mouth.    . BD PEN NEEDLE NANO  U/F 32G X 4 MM MISC every other day.     . Cholecalciferol (VITAMIN D) 2000 units CAPS Take 2,000 Units by mouth daily.    [name] [name] FORTEO 600 MCG/2.4ML SOLN Inject 20 mcg into the skin daily.    . Multiple Vitamins-Minerals (MULTIVITAMIN WITH MINERALS) tablet Take 1 tablet by mouth.    . Omega-3 Fatty Acids (FISH OIL TRIPLE STRENGTH) 1400 MG CAPS Take 1 capsule by mouth daily.     No current facility-administered medications for this visit.     REVIEW OF SYSTEMS:  A 10+ POINT REVIEW OF SYSTEMS WAS OBTAINED including neurology, dermatology, psychiatry, cardiac, respiratory, lymph, extremities, GI, GU, Musculoskeletal,  constitutional, breasts, reproductive, HEENT.  All pertinent positives are noted in the HPI.  All others are negative.   PHYSICAL EXAMINATION:  ECOG PERFORMANCE STATUS: 1 - Symptomatic but completely ambulatory  . Vitals:   10/27/17 1021 10/27/17 1023  BP: (!) 182/53 (!) 162/76  Pulse: 72   Resp: 18   Temp: 98.2 F (36.8 C)   SpO2: 97%    Filed Weights   10/27/17 1021  Weight: 115 lb 6.4 oz (52.3 kg)   .Body mass index is 21.8 kg/m.  GENERAL:alert, in no acute distress and comfortable SKIN: no acute rashes, no significant lesions EYES: conjunctiva are pink and non-injected, sclera anicteric OROPHARYNX: MMM, no exudates, no oropharyngeal erythema or ulceration NECK: supple, no JVD LYMPH:  no palpable lymphadenopathy in the cervical, axillary or inguinal regions LUNGS: clear to auscultation b/l with normal respiratory effort HEART: regular rate & rhythm ABDOMEN:  normoactive bowel sounds , non tender, not distended. No palpable hepatosplenomegaly.  Extremity: no pedal edema PSYCH: alert & oriented x 3 with fluent speech NEURO: no focal motor/sensory deficits   LABORATORY DATA:  I have reviewed the data as listed  CBC Latest Ref Rng & Units 10/27/2017 07/21/2017 04/29/2017  WBC 3.9 - 10.3 K/uL 7.1 9.5 8.5  Hemoglobin 11.6 - 15.9 g/dL 14.1 13.6 13.8  Hematocrit 34.8  - 46.6 % 42.5 41.3 42.3  Platelets 145 - 400 K/uL 215 221 216  HGB 13.6  . CMP Latest Ref Rng & Units 10/27/2017 07/21/2017 04/29/2017  Glucose 70 - 99 mg/dL 79 84 100  BUN 8 - 23 mg/dL _0 Creatinine 0.44 - 1.00 mg/dL 0.99 0.90 0.93  Sodium 135 - 145 mmol/L 141 142 143  Potassium 3.5 - 5.1 mmol/L 5.4(H) 4.2 3.9  Chloride 98 - 111 mmol/L 104 106 106  CO2 22 - 32 mmol/L _1 Calcium 8.9 - 10.3 mg/dL 10.3 11.4(H) 11.0(H)  Total Protein 6.5 - 8.1 g/dL 6.8 6.9 6.8  Total Bilirubin 0.3 - 1.2 mg/dL 0.5 0.6 0.5  Alkaline Phos 38 - 126 U/L 88 76 66  AST 15 - 41 U/L _2 ALT 0 - 44 U/L _3 . Lab Results  Component Value Date   LDH 183 10/27/2017         RADIOGRAPHIC STUDIES: I have personally reviewed the radiological images as listed and agreed with the findings in the report. No results found.  ASSESSMENT & PLAN:   83 year old very pleasant Caucasian lady with  #1 Stage IIIA low-grade follicular lymphoma ( grade 1-2/3) presenting with right upper neck lymphadenopathy. Patient imaging shows that this is at least a stage III A with mesenteric involvement as well. No overt evidence of bone marrow involvement but bone marrow biopsy is not planned for additional staging based on patient preference and the fact that it will not change treatment at this time . LDH level within normal limits FLIPI score - intermediate risk with 5 year OS 78% and ten-year overall survival 51% (from data in the pre-rituxan era) S/p ISRT to cervical LN  Disease. 07/08/17 PET/CT showed slightly increasing activity levels in the mesentery.  PLAN: -Discussed pt labwork today, 10/27/17; blood counts are normal, slightly hyperkalemic with Potassium at 5.4. LDH normal at 183. Calcium has normalized to 10.3 -Suggest her PCP Dr Shelia Media consider Vitamin D replacement if labs in August are not between 60-90 -Will have repeat CT A/P in 3-4 months -The pt shows no clinical or  lab progression of  lymphoma at this time.  -No indication for systemic therapies at this time.   -Will see pt back in 4 months   #2 history of lung carcinoid status post surgery in 2008. No concern with recurrence at this time.  #3 Hypercalcemia -likely from [place]. Less likely from lymphoma. LDH WNL . No other clinical or lab findings suggestive of lymphoma progression at this time. Previously had normalized briefly when she was off the forteo but was placed back on this. Plan -Calcium normalized on 10/27/17 labs at 10.3.    CT chest/abd/pelvis in 16 weeks RTC with Dr Irene Limbo in 4 months with labs    Orders Placed This Encounter  Procedures  . CBC with Differential/Platelet    Standing Status:   Future    Standing Expiration Date:   12/01/2018  . CMP ([facility] only)    Standing Status:   Future    Standing Expiration Date:   10/28/2018  . Lactate dehydrogenase    Standing Status:   Future    Standing Expiration Date:   10/28/2018    All of the patients and her husbands questions were answered to their apparent satisfaction. The patient knows to call the clinic with any problems, questions or concerns.  . The total time spent in the appointment was 20 minutes and more than 50% was on counseling and direct patient cares.    Sullivan Lone MD [place] AAHIVMS [facility] Hematology/Oncology Physician [facility]  (Office):       [phone] (Work cell):  [phone] (Fax):           [phone]  I, Baldwin [place], am acting as a scribe for Dr Irene Limbo.   .I have reviewed the above documentation for accuracy and completeness, and I agree with the above. Brunetta Genera MD

## 2017-10-27 ENCOUNTER — Encounter: Payer: Self-pay | Admitting: Hematology

## 2017-10-27 ENCOUNTER — Telehealth: Payer: Self-pay | Admitting: Hematology

## 2017-10-27 ENCOUNTER — Inpatient Hospital Stay: Payer: Medicare Other | Attending: Hematology

## 2017-10-27 ENCOUNTER — Inpatient Hospital Stay (HOSPITAL_BASED_OUTPATIENT_CLINIC_OR_DEPARTMENT_OTHER): Payer: Medicare Other | Admitting: Hematology

## 2017-10-27 VITALS — BP 162/76 | HR 72 | Temp 98.2°F | Resp 18 | Ht 61.0 in | Wt 115.4 lb

## 2017-10-27 DIAGNOSIS — Z7982 Long term (current) use of aspirin: Secondary | ICD-10-CM | POA: Diagnosis not present

## 2017-10-27 DIAGNOSIS — M810 Age-related osteoporosis without current pathological fracture: Secondary | ICD-10-CM | POA: Insufficient documentation

## 2017-10-27 DIAGNOSIS — Z86010 Personal history of colonic polyps: Secondary | ICD-10-CM | POA: Insufficient documentation

## 2017-10-27 DIAGNOSIS — C8201 Follicular lymphoma grade I, lymph nodes of head, face, and neck: Secondary | ICD-10-CM | POA: Insufficient documentation

## 2017-10-27 DIAGNOSIS — E875 Hyperkalemia: Secondary | ICD-10-CM | POA: Insufficient documentation

## 2017-10-27 DIAGNOSIS — Z79899 Other long term (current) drug therapy: Secondary | ICD-10-CM | POA: Diagnosis not present

## 2017-10-27 DIAGNOSIS — E785 Hyperlipidemia, unspecified: Secondary | ICD-10-CM | POA: Diagnosis not present

## 2017-10-27 LAB — CBC WITH DIFFERENTIAL (CANCER CENTER ONLY)
Basophils Absolute: 0 10*3/uL (ref 0.0–0.1)
Basophils Relative: 1 %
EOS PCT: 2 %
Eosinophils Absolute: 0.1 10*3/uL (ref 0.0–0.5)
HCT: 42.5 % (ref 34.8–46.6)
Hemoglobin: 14.1 g/dL (ref 11.6–15.9)
LYMPHS ABS: 1.8 10*3/uL (ref 0.9–3.3)
Lymphocytes Relative: 25 %
MCH: 30.4 pg (ref 25.1–34.0)
MCHC: 33.2 g/dL (ref 31.5–36.0)
MCV: 91.6 fL (ref 79.5–101.0)
MONOS PCT: 8 %
Monocytes Absolute: 0.6 10*3/uL (ref 0.1–0.9)
Neutro Abs: 4.6 10*3/uL (ref 1.5–6.5)
Neutrophils Relative %: 64 %
PLATELETS: 215 10*3/uL (ref 145–400)
RBC: 4.64 MIL/uL (ref 3.70–5.45)
RDW: 14.4 % (ref 11.2–14.5)
WBC: 7.1 10*3/uL (ref 3.9–10.3)

## 2017-10-27 LAB — CMP (CANCER CENTER ONLY)
ALK PHOS: 88 U/L (ref 38–126)
ALT: 22 U/L (ref 0–44)
AST: 24 U/L (ref 15–41)
Albumin: 4 g/dL (ref 3.5–5.0)
Anion gap: 8 (ref 5–15)
BUN: 21 mg/dL (ref 8–23)
CHLORIDE: 104 mmol/L (ref 98–111)
CO2: 29 mmol/L (ref 22–32)
CREATININE: 0.99 mg/dL (ref 0.44–1.00)
Calcium: 10.3 mg/dL (ref 8.9–10.3)
GFR, EST NON AFRICAN AMERICAN: 52 mL/min — AB (ref >60)
GFR, Est AFR Am: >60 mL/min (ref >60)
Glucose, Bld: 79 mg/dL (ref 70–99)
Potassium: 5.4 mmol/L — ABNORMAL HIGH (ref 3.5–5.1)
Sodium: 141 mmol/L (ref 135–145)
Total Bilirubin: 0.5 mg/dL (ref 0.3–1.2)
Total Protein: 6.8 g/dL (ref 6.5–8.1)

## 2017-10-27 LAB — RETICULOCYTES
RBC.: 4.64 MIL/uL (ref 3.70–5.45)
Retic Count, Absolute: 60.3 10*3/uL (ref 33.7–90.7)
Retic Ct Pct: 1.3 % (ref 0.7–2.1)

## 2017-10-27 LAB — LACTATE DEHYDROGENASE: LDH: 183 U/L (ref 98–192)

## 2017-10-27 NOTE — Telephone Encounter (Signed)
Scheduled appt per 7/11 los - gave patient aVS and calender per los - central radiology to contact patient with scan .,

## 2017-11-30 DIAGNOSIS — Z961 Presence of intraocular lens: Secondary | ICD-10-CM | POA: Diagnosis not present

## 2017-11-30 DIAGNOSIS — H1789 Other corneal scars and opacities: Secondary | ICD-10-CM | POA: Diagnosis not present

## 2017-11-30 DIAGNOSIS — M810 Age-related osteoporosis without current pathological fracture: Secondary | ICD-10-CM | POA: Diagnosis not present

## 2017-11-30 DIAGNOSIS — Z7982 Long term (current) use of aspirin: Secondary | ICD-10-CM | POA: Diagnosis not present

## 2017-11-30 DIAGNOSIS — E041 Nontoxic single thyroid nodule: Secondary | ICD-10-CM | POA: Diagnosis not present

## 2017-11-30 DIAGNOSIS — E7800 Pure hypercholesterolemia, unspecified: Secondary | ICD-10-CM | POA: Diagnosis not present

## 2017-11-30 DIAGNOSIS — H524 Presbyopia: Secondary | ICD-10-CM | POA: Diagnosis not present

## 2017-12-07 DIAGNOSIS — E7800 Pure hypercholesterolemia, unspecified: Secondary | ICD-10-CM | POA: Diagnosis not present

## 2017-12-07 DIAGNOSIS — I341 Nonrheumatic mitral (valve) prolapse: Secondary | ICD-10-CM | POA: Diagnosis not present

## 2017-12-07 DIAGNOSIS — E041 Nontoxic single thyroid nodule: Secondary | ICD-10-CM | POA: Diagnosis not present

## 2017-12-07 DIAGNOSIS — R600 Localized edema: Secondary | ICD-10-CM | POA: Diagnosis not present

## 2017-12-07 DIAGNOSIS — R3129 Other microscopic hematuria: Secondary | ICD-10-CM | POA: Diagnosis not present

## 2017-12-07 DIAGNOSIS — M8000XA Age-related osteoporosis with current pathological fracture, unspecified site, initial encounter for fracture: Secondary | ICD-10-CM | POA: Diagnosis not present

## 2017-12-07 DIAGNOSIS — Z7982 Long term (current) use of aspirin: Secondary | ICD-10-CM | POA: Diagnosis not present

## 2017-12-07 DIAGNOSIS — Z0000 Encounter for general adult medical examination without abnormal findings: Secondary | ICD-10-CM | POA: Diagnosis not present

## 2017-12-07 DIAGNOSIS — R5383 Other fatigue: Secondary | ICD-10-CM | POA: Diagnosis not present

## 2017-12-07 DIAGNOSIS — M545 Low back pain: Secondary | ICD-10-CM | POA: Diagnosis not present

## 2017-12-07 DIAGNOSIS — C8201 Follicular lymphoma grade I, lymph nodes of head, face, and neck: Secondary | ICD-10-CM | POA: Diagnosis not present

## 2017-12-07 DIAGNOSIS — M810 Age-related osteoporosis without current pathological fracture: Secondary | ICD-10-CM | POA: Diagnosis not present

## 2017-12-21 ENCOUNTER — Telehealth: Payer: Self-pay | Admitting: Hematology

## 2017-12-21 NOTE — Telephone Encounter (Signed)
Returned pts call to r/s appts   °

## 2017-12-29 DIAGNOSIS — M810 Age-related osteoporosis without current pathological fracture: Secondary | ICD-10-CM | POA: Diagnosis not present

## 2017-12-29 DIAGNOSIS — Z23 Encounter for immunization: Secondary | ICD-10-CM | POA: Diagnosis not present

## 2018-02-16 ENCOUNTER — Telehealth: Payer: Self-pay | Admitting: Hematology

## 2018-02-16 NOTE — Telephone Encounter (Signed)
Left message - per 10/31 sch message with appt date and time.

## 2018-02-21 ENCOUNTER — Ambulatory Visit (HOSPITAL_COMMUNITY)
Admission: RE | Admit: 2018-02-21 | Discharge: 2018-02-21 | Disposition: A | Discharge status: Home / Self Care | Payer: Medicare Other | Source: Ambulatory Visit | Attending: Hematology | Admitting: Hematology

## 2018-02-21 ENCOUNTER — Inpatient Hospital Stay: Payer: Medicare Other | Attending: Hematology

## 2018-02-21 DIAGNOSIS — K449 Diaphragmatic hernia without obstruction or gangrene: Secondary | ICD-10-CM | POA: Diagnosis not present

## 2018-02-21 DIAGNOSIS — C8201 Follicular lymphoma grade I, lymph nodes of head, face, and neck: Secondary | ICD-10-CM

## 2018-02-21 DIAGNOSIS — E7800 Pure hypercholesterolemia, unspecified: Secondary | ICD-10-CM | POA: Diagnosis not present

## 2018-02-21 DIAGNOSIS — M810 Age-related osteoporosis without current pathological fracture: Secondary | ICD-10-CM | POA: Diagnosis not present

## 2018-02-21 DIAGNOSIS — C8290 Follicular lymphoma, unspecified, unspecified site: Secondary | ICD-10-CM | POA: Diagnosis not present

## 2018-02-21 DIAGNOSIS — C8209 Follicular lymphoma grade I, extranodal and solid organ sites: Secondary | ICD-10-CM | POA: Diagnosis not present

## 2018-02-21 DIAGNOSIS — C8203 Follicular lymphoma grade I, intra-abdominal lymph nodes: Secondary | ICD-10-CM | POA: Diagnosis not present

## 2018-02-21 LAB — CBC WITH DIFFERENTIAL/PLATELET
Abs Immature Granulocytes: 0.01 10*3/uL (ref 0.00–0.07)
BASOS PCT: 1 %
Basophils Absolute: 0.1 10*3/uL (ref 0.0–0.1)
EOS PCT: 1 %
Eosinophils Absolute: 0.1 10*3/uL (ref 0.0–0.5)
HCT: 45.3 % (ref 36.0–46.0)
HEMOGLOBIN: 14.4 g/dL (ref 12.0–15.0)
Immature Granulocytes: 0 %
Lymphocytes Relative: 27 %
Lymphs Abs: 2.3 10*3/uL (ref 0.7–4.0)
MCH: 29.1 pg (ref 26.0–34.0)
MCHC: 31.8 g/dL (ref 30.0–36.0)
MCV: 91.7 fL (ref 80.0–100.0)
MONOS PCT: 7 %
Monocytes Absolute: 0.6 10*3/uL (ref 0.1–1.0)
Neutro Abs: 5.2 10*3/uL (ref 1.7–7.7)
Neutrophils Relative %: 64 %
Platelets: 219 10*3/uL (ref 150–400)
RBC: 4.94 MIL/uL (ref 3.87–5.11)
RDW: 14.3 % (ref 11.5–15.5)
WBC: 8.3 10*3/uL (ref 4.0–10.5)
nRBC: 0 % (ref 0.0–0.2)

## 2018-02-21 LAB — CMP (CANCER CENTER ONLY)
ALBUMIN: 4.2 g/dL (ref 3.5–5.0)
ALK PHOS: 65 U/L (ref 38–126)
ALT: 23 U/L (ref 0–44)
AST: 25 U/L (ref 15–41)
Anion gap: 9 (ref 5–15)
BILIRUBIN TOTAL: 0.4 mg/dL (ref 0.3–1.2)
BUN: 14 mg/dL (ref 8–23)
CALCIUM: 9.7 mg/dL (ref 8.9–10.3)
CO2: 27 mmol/L (ref 22–32)
CREATININE: 0.83 mg/dL (ref 0.44–1.00)
Chloride: 106 mmol/L (ref 98–111)
GFR, Est AFR Am: >60 mL/min (ref >60)
GLUCOSE: 86 mg/dL (ref 70–99)
Potassium: 4.2 mmol/L (ref 3.5–5.1)
Sodium: 142 mmol/L (ref 135–145)
TOTAL PROTEIN: 7.2 g/dL (ref 6.5–8.1)

## 2018-02-21 LAB — LACTATE DEHYDROGENASE: LDH: 175 U/L (ref 98–192)

## 2018-02-21 IMAGING — CT CT CHEST W/ CM
2 of 5 series · 12 of 36 positions shown, 15 images · IV contrast (APPLIED)
Comparison: PET-CT 07/08/2017

CLINICAL DATA: Follicular lymphoma presenting with right upper neck
lymphadenopathy.

EXAM:
CT CHEST, ABDOMEN, AND PELVIS WITH CONTRAST
TECHNIQUE: Multidetector CT imaging of the chest, abdomen and pelvis was
performed following the standard protocol during bolus
administration of intravenous contrast.
CONTRAST:  100mL OMNIPAQUE IOHEXOL 300 MG/ML  SOLN

[Series 2: cap with · axial · 0.72mm/px · z∈[-562,-107]mm · 9 of 115 slices shown, 12 images]
[im 12/115  mediastinal]
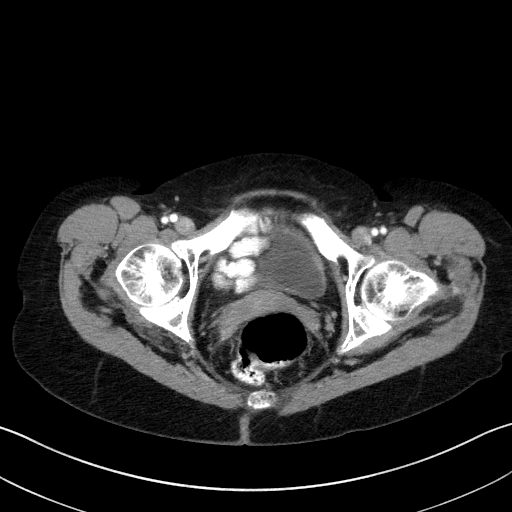
[im 12/115  lung]
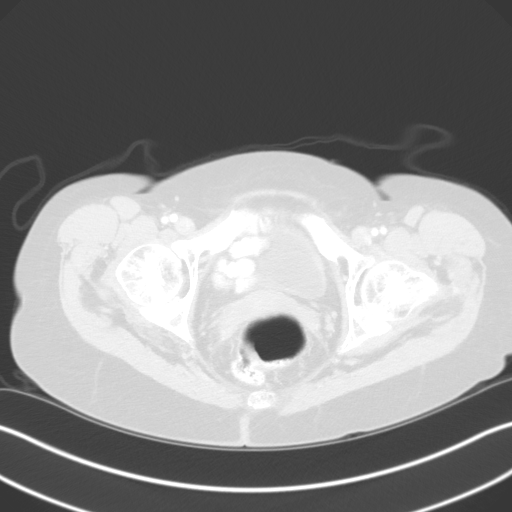
[im 23/115  lung]
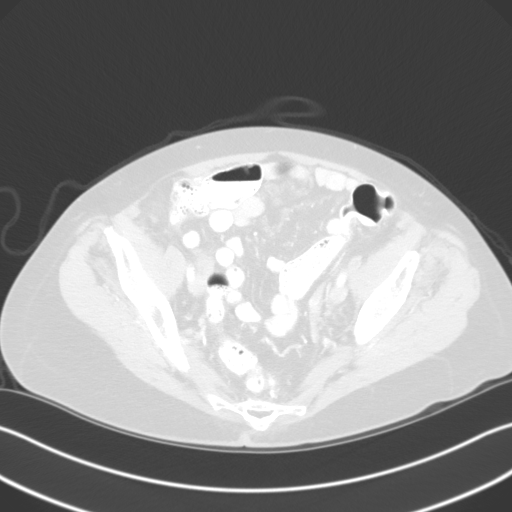
[im 35/115  lung]
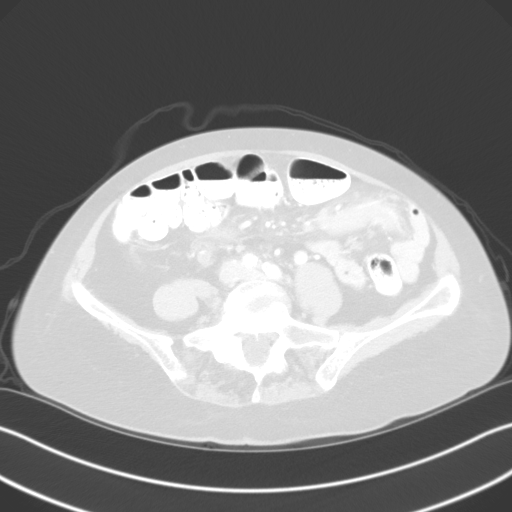
[im 46/115  lung]
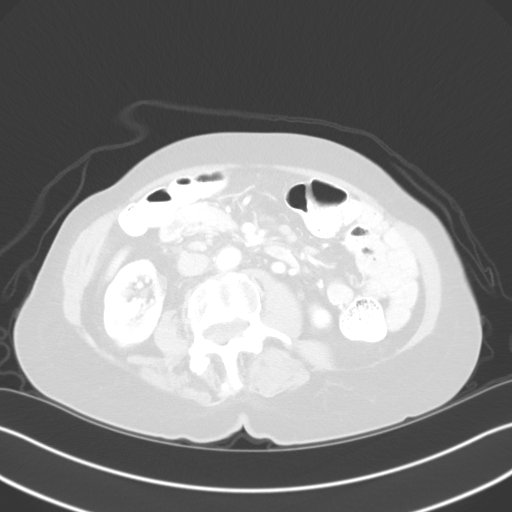
[im 58/115  mediastinal]
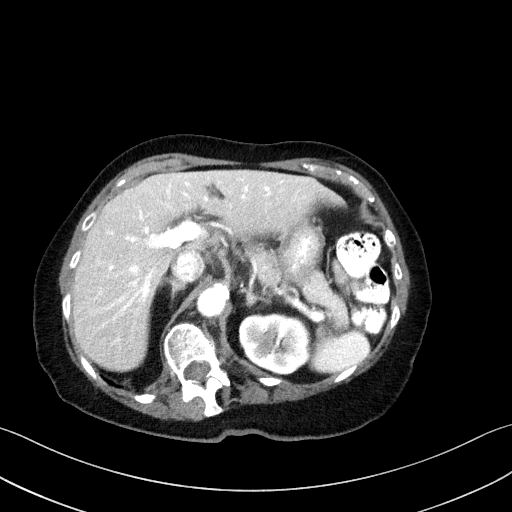
[im 58/115  lung]
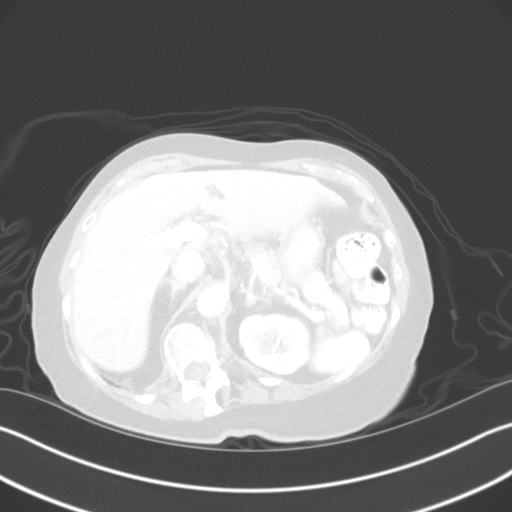
[im 69/115  lung]
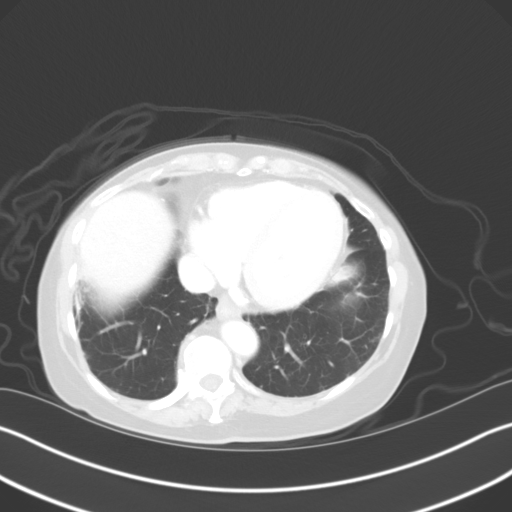
[im 80/115  lung]
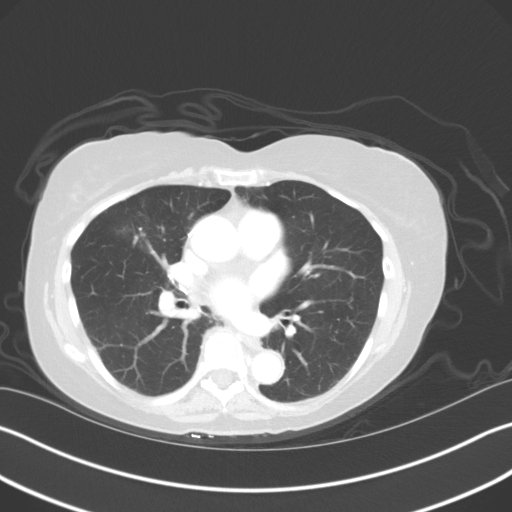
[im 92/115  lung]
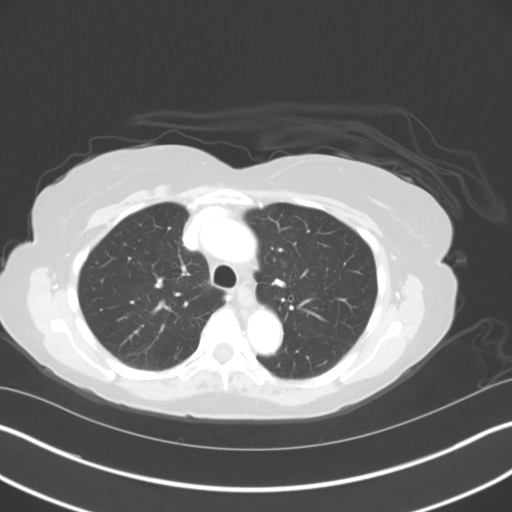
[im 103/115  mediastinal]
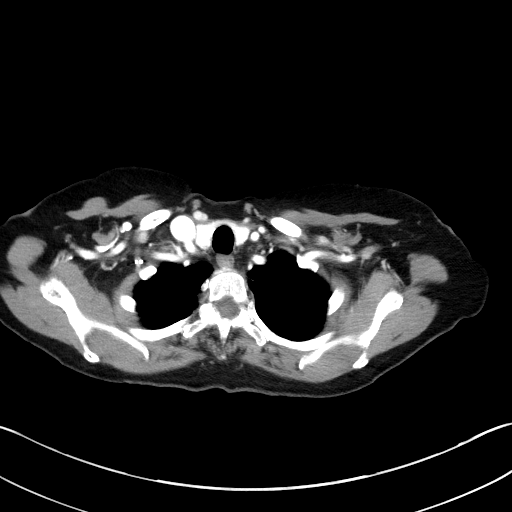
[im 103/115  lung]
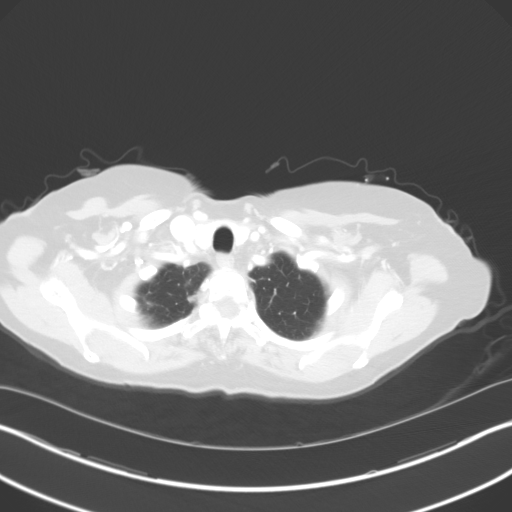

[Series 4: coronals · coronal · 0.99mm/px · 3 of 107 slices shown]
[im 22/107  lung]
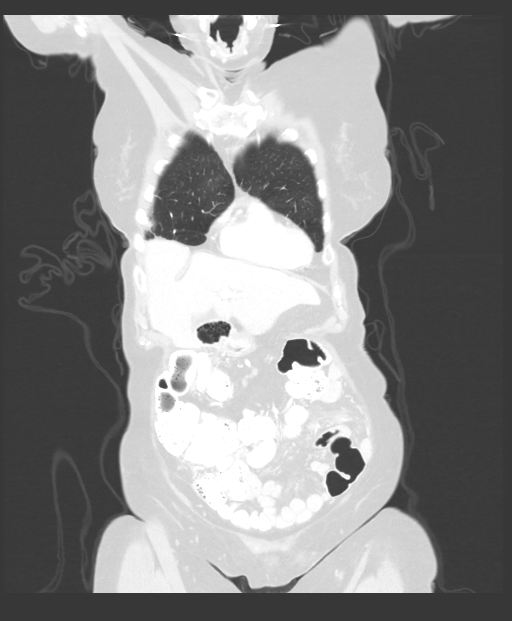
[im 43/107  lung]
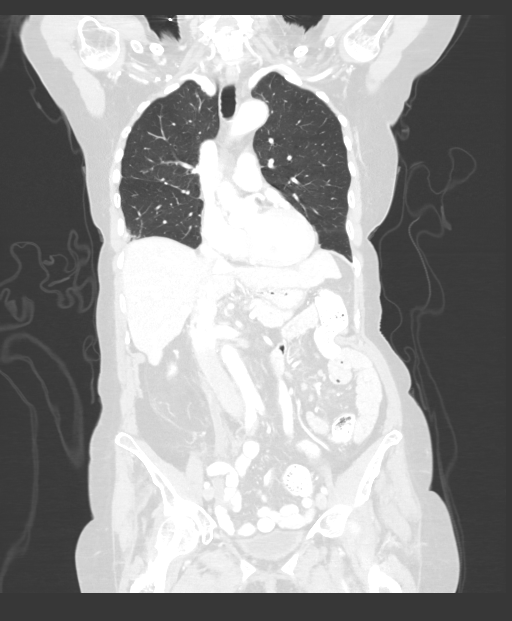
[im 64/107  lung]
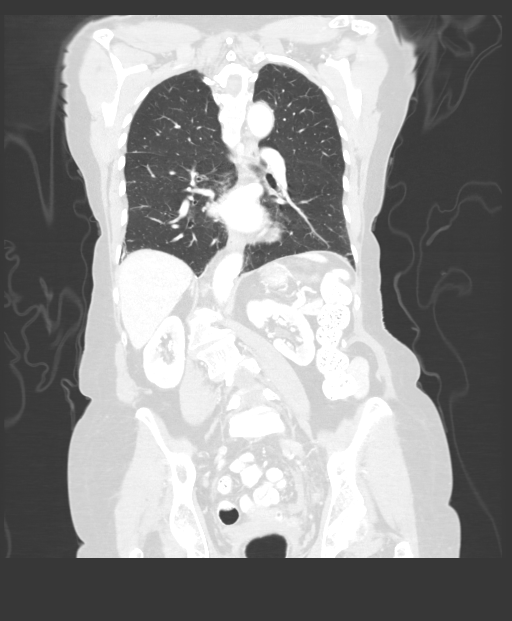

[12 of 36 positions shown; findings below may reference images not displayed]

FINDINGS: CT CHEST FINDINGS

Cardiovascular: Heart size upper normal. No pericardial effusion.
Atherosclerotic calcification is noted in the wall of the thoracic
aorta.

Mediastinum/Nodes: No mediastinal lymphadenopathy. There is no hilar
lymphadenopathy. Small hiatal hernia. The esophagus has normal
imaging features. There is no axillary lymphadenopathy.

Lungs/Pleura: The central tracheobronchial airways are patent.
Surgical changes noted in the right lung with anterior subpleural
scarring, stable. No suspicious pulmonary nodule or mass. No
pulmonary edema or pleural effusion.

Musculoskeletal: No worrisome lytic or sclerotic osseous
abnormality.

CT ABDOMEN PELVIS FINDINGS

Hepatobiliary: No focal abnormality within the liver parenchyma.
There is no evidence for gallstones, gallbladder wall thickening, or
pericholecystic fluid. No intrahepatic or extrahepatic biliary
dilation.

Pancreas: No focal mass lesion. No dilatation of the main duct. No
intraparenchymal cyst. No peripancreatic edema.

Spleen: No splenomegaly. No focal mass lesion.

Adrenals/Urinary Tract: No adrenal nodule or mass. 6 mm low-density
lesion lower pole right kidney is too small to characterize but
likely a cyst. Left kidney unremarkable. No evidence for
hydroureter. The urinary bladder appears normal for the degree of
distention.

Stomach/Bowel: Small hiatal hernia. Stomach otherwise unremarkable.
Duodenum is normally positioned as is the ligament of Treitz. No
small bowel wall thickening. No small bowel dilatation. The terminal
ileum is normal. The appendix is not visualized, but there is no
edema or inflammation in the region of the cecum. No gross colonic
mass. No colonic wall thickening. No substantial diverticular
change.

Vascular/Lymphatic: There is abdominal aortic atherosclerosis
without aneurysm. There is no gastrohepatic or hepatoduodenal
ligament lymphadenopathy. No intraperitoneal or retroperitoneal
lymphadenopathy. Persistent abnormal soft tissue is identified in
the left abdominal mesentery with 4.9 x 1.6 cm area of abnormal soft
tissue seen on image 80/series 2. This is probably similar to the
prior PET-CT given mobility of the mesentery. The lesion in the
right paracolic gutter measured at 3.5 x 1.8 cm previously is
similar, measuring 3.4 x 2.4 cm today. 2.3 x 1.0 cm soft tissue
focus in the central mesentery (75/2) was 2.5 x 1.0 cm when I
remeasure it in a similar fashion on the prior study.

Age-indeterminate thrombosis of the right gonadal vein evident.

Reproductive: The uterus has normal CT imaging appearance. There is
no adnexal mass.

Other: No intraperitoneal free fluid.

Musculoskeletal: No worrisome lytic or sclerotic osseous
abnormality.
IMPRESSION: 1. Areas of abnormal, ill-defined soft tissue in the mesentery and
along the peritoneal surface identified on previous PET-CT are
similar today. No new or progressive interval findings.
2.  Aortic Atherosclerois ([id number]-170.0)

## 2018-02-21 MED ORDER — SODIUM CHLORIDE 0.9 % IJ SOLN
INTRAMUSCULAR | Status: AC
  Filled 2018-02-21: qty 50

## 2018-02-21 MED ORDER — IOHEXOL 300 MG/ML  SOLN
100.0000 mL | Freq: Once | INTRAMUSCULAR | Status: AC | PRN
  Administered 2018-02-21: 100 mL via INTRAVENOUS

## 2018-02-23 ENCOUNTER — Telehealth: Payer: Self-pay | Admitting: Hematology

## 2018-02-23 NOTE — Telephone Encounter (Signed)
GK out 11/11 - per [place] move appointments two weeks out. Left message on home re f/u 11/27. Also called cell - spoke with patient re change and per patient she cannot come in 11/27 due to going out of town for Thanksgiving. Moved f/u from 11/27 to 12/3.

## 2018-02-27 ENCOUNTER — Ambulatory Visit: Payer: Medicare Other | Admitting: Hematology

## 2018-02-27 ENCOUNTER — Other Ambulatory Visit: Payer: Medicare Other

## 2018-03-09 DIAGNOSIS — C8201 Follicular lymphoma grade I, lymph nodes of head, face, and neck: Secondary | ICD-10-CM | POA: Diagnosis not present

## 2018-03-09 DIAGNOSIS — M810 Age-related osteoporosis without current pathological fracture: Secondary | ICD-10-CM | POA: Diagnosis not present

## 2018-03-15 ENCOUNTER — Ambulatory Visit: Payer: Medicare Other | Admitting: Hematology

## 2018-03-20 NOTE — Progress Notes (Signed)
[name]    HEMATOLOGY/ONCOLOGY CLINIC NOTE  Date of Service: 03/21/18    Patient Care Team: Deland Pretty, MD as PCP - General (Internal Medicine)  CHIEF COMPLAINTS/PURPOSE OF CONSULTATION:   F/u for continued management of follicular lymphoma  HISTORY OF PRESENTING ILLNESS:   [name] [name] is a wonderful 83 y.o. female who has been referred to [place] by Dr .Deland Pretty, MD for evaluation and management of newly diagnosed follicular lymphoma.  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 Laverta Baltimore who is a retired Publishing rights manager from [place], [place].  She presented to her primary care physician with 3 months of asymptomatic fullness/swelling in her right upper neck with no overt symptoms of dental pain or swelling, fevers, chills or night sweats.  The swelling was painless and gradually growing.  She was referred to Dr. Jerrell Belfast (ENT) for further evaluation and had a CT of the neck on 12/08/2015 which showed findings "soft tissue mass measuring up to 3.2 cm which most resembles an abnormal right level 1 B lymph node. This medially displaces but seems to be separate from the adjacent right submandibular gland.There is a larger spiculated and somewhat infiltrative appearing right lateral neck mass centered at the level 2 nodal station measuring up to 4.7 cm. This lesion is inseparable from the undersurface of the right sternocleidomastoid muscle, but does not appear to invade the subjacent right carotid space.  Associated small but asymmetrically enlarged and conspicuous right level 3 lymph nodes. No primary pharyngeal or laryngeal tumor identified."  The patient subsequently had incisional biopsy of the right upper neck lymph node on 12/19/2015 which is consistent with a low-grade follicular lymphoma. Patient has been referred for  evaluation and management for follicular lymphoma. She notes no other areas of obviously enlarged lymph nodes. No constitutional symptoms such as fevers, chills, night sweats, unexpected weight loss. Overall feels well and no differently than she felt 6 months ago.  INTERVAL HISTORY  [name] is here for her scheduled followup for follicular lymphoma.  The patient's last visit with [place] was on 10/27/17. She is accompanied today by her husband. The pt reports that she is doing well overall.   The pt reports that she has not developed any new concerns in the interim. She continues to enjoy good energy levels and denies any fevers, chills, night sweats, or concerns for infections.   The pt notes that she is no longer taking Forteo, but continues Prolia injections and Vitamin D replacement.   Of note since the patient's last visit, pt has had a CT C/A/P completed on 02/21/18 with results revealing Areas of abnormal, ill-defined soft tissue in the mesentery and along the peritoneal surface identified on previous PET-CT are similar today. No new or progressive interval findings. 2.  Aortic Atherosclerois.  Lab results (02/21/18) of CBC w/diff and CMP is as follows: all values are WNL. 02/21/18 LDH normal at 175  On review of systems, pt reports good energy levels, normal and regular bowel movements, eating well, and denies fevers, chills, night sweats, noticing any new lumps or bumps, unexpected weight loss, abdominal pains, and any other symptoms.   MEDICAL HISTORY:  Past Medical History:  Diagnosis Date  . Cancer ([place])    follicular lymphoma  . Cataract   . History of colon polyps    all  colonic mucosa  . Hyperlipidemia    on lipitor  . 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 COLONOSCOPY     [id number]  . EYE SURGERY Bilateral     cataract removal  . 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 POLYPECTOMY     2001,2006-all colonic mucosa   . SUBMANDIBULAR GLAND EXCISION Right 12/19/2015   Procedure: BIOPSY OF RIGHT SUBMANDIBULAR MASS;  Surgeon: Jerrell Belfast, MD;  Location: [facility] OR;  Service: ENT;  Laterality: Right;    SOCIAL HISTORY: Social History   Socioeconomic History  . Marital status: Married    Spouse name: Not on file  . Number of children: Not on file  . Years of education: Not on file  . Highest education level: Not on file  Occupational History  . Not on file  Social Needs  . Financial resource strain: Not on file  . Food insecurity:    Worry: Not on file    Inability: Not on file  . Transportation needs:    Medical: Not on file    Non-medical: Not on file  Tobacco Use  . Smoking status: Never Smoker  . Smokeless tobacco: Never Used  Substance and Sexual Activity  . Alcohol use: No    Alcohol/week: 0.0 standard drinks  . Drug use: No  . Sexual activity: Not on file  Lifestyle  . Physical activity:    Days per week: Not on file    Minutes per session: Not on file  . Stress: Not on file  Relationships  . Social connections:    Talks on phone: Not on file    Gets together: Not on file    Attends religious service: Not on file    Active member of club or organization: Not on file    Attends meetings of clubs or organizations: Not on file    Relationship status: Not on file  . Intimate partner violence:    Fear of current or ex partner: Not on file    Emotionally abused: Not on file    Physically abused: Not on file    Forced sexual activity: Not on file  Other Topics Concern  . Not on file  Social History Narrative  . Not on file    FAMILY HISTORY: Family History  Problem Relation Age of Onset  . Colon cancer Neg Hx   . Colon polyps Neg Hx   . Esophageal cancer Neg Hx   . Rectal cancer Neg Hx   . Stomach cancer Neg Hx     ALLERGIES:  is allergic to  no known allergies.  MEDICATIONS:  Current Outpatient Medications  Medication Sig Dispense Refill  . aspirin 81 MG tablet Chew 81 mg by mouth daily.    [name] atorvastatin (LIPITOR) 20 MG tablet Take 20 mg by mouth.    . BD PEN NEEDLE NANO U/F 32G X 4 MM MISC every other day.     . Cholecalciferol (VITAMIN D) 2000 units CAPS Take 2,000 Units by mouth daily.    [name] FORTEO 600 MCG/2.4ML SOLN Inject 20 mcg into the skin daily.    . Multiple Vitamins-Minerals (MULTIVITAMIN WITH MINERALS) tablet Take 1 tablet by mouth.    . Omega-3 Fatty Acids (FISH OIL TRIPLE STRENGTH) 1400 MG CAPS Take 1 capsule by mouth daily.  No current facility-administered medications for this visit.     REVIEW OF SYSTEMS:  A 10+ POINT REVIEW OF SYSTEMS WAS OBTAINED including neurology, dermatology, psychiatry, cardiac, respiratory, lymph, extremities, GI, GU, Musculoskeletal, constitutional, breasts, reproductive, HEENT.  All pertinent positives are noted in the HPI.  All others are negative.   PHYSICAL EXAMINATION:  ECOG PERFORMANCE STATUS: 1 - Symptomatic but completely ambulatory  . Vitals:   03/21/18 1018  BP: (!) 185/62  Pulse: 72  Resp: 18  Temp: 98.7 F (37.1 C)  SpO2: 95%   Filed Weights   03/21/18 1018  Weight: 121 lb 8 oz (55.1 kg)   .Body mass index is 22.96 kg/m.  GENERAL:alert, in no acute distress and comfortable SKIN: no acute rashes, no significant lesions EYES: conjunctiva are pink and non-injected, sclera anicteric OROPHARYNX: MMM, no exudates, no oropharyngeal erythema or ulceration NECK: supple, no JVD LYMPH:  no palpable lymphadenopathy in the cervical, axillary or inguinal regions LUNGS: clear to auscultation b/l with normal respiratory effort HEART: regular rate & rhythm ABDOMEN:  normoactive bowel sounds , non tender, not distended. No palpable hepatosplenomegaly.  Extremity: no pedal edema PSYCH: alert & oriented x 3 with fluent speech NEURO: no focal motor/sensory deficits     LABORATORY DATA:  I have reviewed the data as listed  CBC Latest Ref Rng & Units 02/21/2018 10/27/2017 07/21/2017  WBC 4.0 - 10.5 K/uL 8.3 7.1 9.5  Hemoglobin 12.0 - 15.0 g/dL 14.4 14.1 13.6  Hematocrit 36.0 - 46.0 % 45.3 42.5 41.3  Platelets 150 - 400 K/uL 219 215 221  HGB 13.6  . CMP Latest Ref Rng & Units 02/21/2018 10/27/2017 07/21/2017  Glucose 70 - 99 mg/dL 86 79 84  BUN 8 - 23 mg/dL _0 Creatinine 0.44 - 1.00 mg/dL 0.83 0.99 0.90  Sodium 135 - 145 mmol/L 142 141 142  Potassium 3.5 - 5.1 mmol/L 4.2 5.4(H) 4.2  Chloride 98 - 111 mmol/L 106 104 106  CO2 22 - 32 mmol/L _1 Calcium 8.9 - 10.3 mg/dL 9.7 10.3 11.4(H)  Total Protein 6.5 - 8.1 g/dL 7.2 6.8 6.9  Total Bilirubin 0.3 - 1.2 mg/dL 0.4 0.5 0.6  Alkaline Phos 38 - 126 U/L 65 88 76  AST 15 - 41 U/L _2 ALT 0 - 44 U/L _3 . Lab Results  Component Value Date   LDH 175 02/21/2018         RADIOGRAPHIC STUDIES: I have personally reviewed the radiological images as listed and agreed with the findings in the report. Ct Chest W Contrast  Result Date: 02/21/2018 CLINICAL DATA:  Follicular lymphoma presenting with right upper neck lymphadenopathy. EXAM: CT CHEST, ABDOMEN, AND PELVIS WITH CONTRAST TECHNIQUE: Multidetector CT imaging of the chest, abdomen and pelvis was performed following the standard protocol during bolus administration of intravenous contrast. CONTRAST:  140m OMNIPAQUE IOHEXOL 300 MG/ML  SOLN COMPARISON:  PET-CT 07/08/2017 FINDINGS: CT CHEST FINDINGS Cardiovascular: Heart size upper normal. No pericardial effusion. Atherosclerotic calcification is noted in the wall of the thoracic aorta. Mediastinum/Nodes: No mediastinal lymphadenopathy. There is no hilar lymphadenopathy. Small hiatal hernia. The esophagus has normal imaging features. There is no axillary lymphadenopathy. Lungs/Pleura: The central tracheobronchial airways are patent. Surgical changes noted in the right lung with anterior  subpleural scarring, stable. No suspicious pulmonary nodule or mass. No pulmonary edema or pleural effusion. Musculoskeletal: No worrisome lytic or sclerotic osseous abnormality. CT ABDOMEN PELVIS FINDINGS Hepatobiliary: No focal  abnormality within the liver parenchyma. There is no evidence for gallstones, gallbladder wall thickening, or pericholecystic fluid. No intrahepatic or extrahepatic biliary dilation. Pancreas: No focal mass lesion. No dilatation of the main duct. No intraparenchymal cyst. No peripancreatic edema. Spleen: No splenomegaly. No focal mass lesion. Adrenals/Urinary Tract: No adrenal nodule or mass. 6 mm low-density lesion lower pole right kidney is too small to characterize but likely a cyst. Left kidney unremarkable. No evidence for hydroureter. The urinary bladder appears normal for the degree of distention. Stomach/Bowel: Small hiatal hernia. Stomach otherwise unremarkable. Duodenum is normally positioned as is the ligament of Treitz. No small bowel wall thickening. No small bowel dilatation. The terminal ileum is normal. The appendix is not visualized, but there is no edema or inflammation in the region of the cecum. No gross colonic mass. No colonic wall thickening. No substantial diverticular change. Vascular/Lymphatic: There is abdominal aortic atherosclerosis without aneurysm. There is no gastrohepatic or hepatoduodenal ligament lymphadenopathy. No intraperitoneal or retroperitoneal lymphadenopathy. Persistent abnormal soft tissue is identified in the left abdominal mesentery with 4.9 x 1.6 cm area of abnormal soft tissue seen on image 80/series 2. This is probably similar to the prior PET-CT given mobility of the mesentery. The lesion in the right paracolic gutter measured at 3.5 x 1.8 cm previously is similar, measuring 3.4 x 2.4 cm today. 2.3 x 1.0 cm soft tissue focus in the central mesentery (75/2) was 2.5 x 1.0 cm when I remeasure it in a similar fashion on the prior study.  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 Reproductive: The uterus has normal CT imaging appearance. There is no adnexal mass. Other: No intraperitoneal free fluid. Musculoskeletal: No worrisome lytic or sclerotic osseous abnormality. IMPRESSION: 1. Areas of abnormal, ill-defined soft tissue in the mesentery and along the peritoneal surface identified on previous PET-CT are similar today. No new or progressive interval findings. 2.  Aortic Atherosclerois (ICD10-170.0) Electronically Signed   By: Misty Stanley M.D.   On: 02/21/2018 17:22   Ct Abdomen Pelvis W Contrast  Result Date: 02/21/2018 CLINICAL DATA:  Follicular lymphoma presenting with right upper neck lymphadenopathy. EXAM: CT CHEST, ABDOMEN, AND PELVIS WITH CONTRAST TECHNIQUE: Multidetector CT imaging of the chest, abdomen and pelvis was performed following the standard protocol during bolus administration of intravenous contrast. CONTRAST:  162m OMNIPAQUE IOHEXOL 300 MG/ML  SOLN COMPARISON:  PET-CT 07/08/2017 FINDINGS: CT CHEST FINDINGS Cardiovascular: Heart size upper normal. No pericardial effusion. Atherosclerotic calcification is noted in the wall of the thoracic aorta. Mediastinum/Nodes: No mediastinal lymphadenopathy. There is no hilar lymphadenopathy. Small hiatal hernia. The esophagus has normal imaging features. There is no axillary lymphadenopathy. Lungs/Pleura: The central tracheobronchial airways are patent. Surgical changes noted in the right lung with anterior subpleural scarring, stable. No suspicious pulmonary nodule or mass. No pulmonary edema or pleural effusion. Musculoskeletal: No worrisome lytic or sclerotic osseous abnormality. CT ABDOMEN PELVIS FINDINGS Hepatobiliary: No focal abnormality within the liver parenchyma. There is no evidence for gallstones, gallbladder wall thickening, or pericholecystic fluid. No intrahepatic or extrahepatic biliary dilation. Pancreas: No focal mass lesion. No dilatation of  the main duct. No intraparenchymal cyst. No peripancreatic edema. Spleen: No splenomegaly. No focal mass lesion. Adrenals/Urinary Tract: No adrenal nodule or mass. 6 mm low-density lesion lower pole right kidney is too small to characterize but likely a cyst. Left kidney unremarkable. No evidence for hydroureter. The urinary bladder appears normal for the degree of distention. Stomach/Bowel: Small hiatal hernia. Stomach otherwise unremarkable. Duodenum is normally positioned  as is the ligament of Treitz. No small bowel wall thickening. No small bowel dilatation. The terminal ileum is normal. The appendix is not visualized, but there is no edema or inflammation in the region of the cecum. No gross colonic mass. No colonic wall thickening. No substantial diverticular change. Vascular/Lymphatic: There is abdominal aortic atherosclerosis without aneurysm. There is no gastrohepatic or hepatoduodenal ligament lymphadenopathy. No intraperitoneal or retroperitoneal lymphadenopathy. Persistent abnormal soft tissue is identified in the left abdominal mesentery with 4.9 x 1.6 cm area of abnormal soft tissue seen on image 80/series 2. This is probably similar to the prior PET-CT given mobility of the mesentery. The lesion in the right paracolic gutter measured at 3.5 x 1.8 cm previously is similar, measuring 3.4 x 2.4 cm today. 2.3 x 1.0 cm soft tissue focus in the central mesentery (75/2) was 2.5 x 1.0 cm when I remeasure it in a similar fashion on the prior study. 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 Reproductive: The uterus has normal CT imaging appearance. There is no adnexal mass. Other: No intraperitoneal free fluid. Musculoskeletal: No worrisome lytic or sclerotic osseous abnormality. IMPRESSION: 1. Areas of abnormal, ill-defined soft tissue in the mesentery and along the peritoneal surface identified on previous PET-CT are similar today. No new or progressive interval findings. 2.  Aortic  Atherosclerois (ICD10-170.0) Electronically Signed   By: Misty Stanley M.D.   On: 02/21/2018 17:22    ASSESSMENT & PLAN:   83 y.o. 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 Patient imaging shows that this is at least a stage III A with mesenteric involvement as well. No overt evidence of bone marrow involvement but bone marrow biopsy is not planned for additional staging based on patient preference and the fact that it will not change treatment at this time . LDH level within normal limits FLIPI score - intermediate risk with 5 year OS 78% and ten-year overall survival 51% (from data in the pre-rituxan era) S/p ISRT to cervical LN  Disease. 07/08/17 PET/CT showed slightly increasing activity levels in the mesentery.  PLAN: -Discussed pt labwork from 02/21/18; blood counts and chemistries are normal, including Calcium after she stopped Forteo  -Discussed the 02/21/18 CT C/A/P which revealed Areas of abnormal, ill-defined soft tissue in the mesentery and along the peritoneal surface identified on previous PET-CT are similar today. No new or progressive interval findings. 2.  Aortic Atherosclerois.  -The pt shows no clinical, radiographic, or lab progression/return of follicular lymphoma at this time. -No indication for further treatment at this time.  -Will see the pt back in 4 months   #2 history of lung carcinoid status post surgery in 2008. No concern with recurrence at this time.  #3 Hypercalcemia -likely from Forteo. Less likely from lymphoma. LDH WNL . No other clinical or lab findings suggestive of lymphoma progression at this time. Previously had normalized briefly when she was off the forteo but was placed back on this. Pt stopped Forteo and her calcium has since normalized.   Plan -Calcium normalized on 10/27/17 labs at 10.3, and continue to be normal on labs on 02/21/2018   RTC  with Dr Irene Limbo with labs in 4 months   Orders Placed This Encounter  Procedures  . CBC with Differential/Platelet    Standing Status:   Future    Standing Expiration Date:   04/25/2019  . CMP ([place] only)  Standing Status:   Future    Standing Expiration Date:   03/22/2019  . Lactate dehydrogenase    Standing Status:   Future    Standing Expiration Date:   03/22/2019    All of the patients and her husbands questions were answered to their apparent satisfaction. The patient knows to call the clinic with any problems, questions or concerns.  The total time spent in the appt was 25 minutes and more than 50% was on counseling and direct patient cares.    Sullivan Lone MD [place] AAHIVMS [facility] Hematology/Oncology Physician [facility]  (Office):       [phone] (Work cell):  [phone] (Fax):           [phone]  I, Baldwin [place], am acting as a scribe for Dr. Sullivan Lone.   .I have reviewed the above documentation for accuracy and completeness, and I agree with the above. Brunetta Genera MD

## 2018-03-21 ENCOUNTER — Inpatient Hospital Stay: Payer: Medicare Other | Attending: Hematology | Admitting: Hematology

## 2018-03-21 ENCOUNTER — Telehealth: Payer: Self-pay

## 2018-03-21 VITALS — BP 185/62 | HR 72 | Temp 98.7°F | Resp 18 | Ht 61.0 in | Wt 121.5 lb

## 2018-03-21 DIAGNOSIS — M810 Age-related osteoporosis without current pathological fracture: Secondary | ICD-10-CM

## 2018-03-21 DIAGNOSIS — C8201 Follicular lymphoma grade I, lymph nodes of head, face, and neck: Secondary | ICD-10-CM | POA: Insufficient documentation

## 2018-03-21 DIAGNOSIS — Z79899 Other long term (current) drug therapy: Secondary | ICD-10-CM

## 2018-03-21 DIAGNOSIS — Z982 Presence of cerebrospinal fluid drainage device: Secondary | ICD-10-CM | POA: Diagnosis not present

## 2018-03-21 DIAGNOSIS — Z7982 Long term (current) use of aspirin: Secondary | ICD-10-CM | POA: Insufficient documentation

## 2018-03-21 DIAGNOSIS — E785 Hyperlipidemia, unspecified: Secondary | ICD-10-CM

## 2018-03-21 NOTE — Telephone Encounter (Signed)
Printed avs and calender of upcoming appointment. Per 12/3 los 

## 2018-06-15 DIAGNOSIS — D1722 Benign lipomatous neoplasm of skin and subcutaneous tissue of left arm: Secondary | ICD-10-CM | POA: Diagnosis not present

## 2018-06-15 DIAGNOSIS — Z1231 Encounter for screening mammogram for malignant neoplasm of breast: Secondary | ICD-10-CM | POA: Diagnosis not present

## 2018-06-15 DIAGNOSIS — D1801 Hemangioma of skin and subcutaneous tissue: Secondary | ICD-10-CM | POA: Diagnosis not present

## 2018-06-15 DIAGNOSIS — L812 Freckles: Secondary | ICD-10-CM | POA: Diagnosis not present

## 2018-06-15 DIAGNOSIS — L821 Other seborrheic keratosis: Secondary | ICD-10-CM | POA: Diagnosis not present

## 2018-06-15 DIAGNOSIS — L570 Actinic keratosis: Secondary | ICD-10-CM | POA: Diagnosis not present

## 2018-06-15 DIAGNOSIS — Z85828 Personal history of other malignant neoplasm of skin: Secondary | ICD-10-CM | POA: Diagnosis not present

## 2018-07-07 ENCOUNTER — Telehealth: Payer: Self-pay | Admitting: *Deleted

## 2018-07-07 NOTE — Telephone Encounter (Signed)
Patient asked to reschedule appt for 3/31. Per Dr. Irene Limbo, can move appt until end of May. Notified patient of his direction, she verbalized understanding. Msg sent to scheduling.

## 2018-07-10 ENCOUNTER — Telehealth: Payer: Self-pay | Admitting: Hematology

## 2018-07-10 NOTE — Telephone Encounter (Signed)
Called patient per 3/20 VM log.  Patient stated she will call back to reschedule when the Covid-19 numbers have dropped.

## 2018-07-11 ENCOUNTER — Telehealth: Payer: Self-pay | Admitting: Hematology

## 2018-07-11 NOTE — Telephone Encounter (Signed)
R/s appt per 3/20 sch message - unable to reach pt or leave message - sent reminder letter in the mail.

## 2018-07-18 ENCOUNTER — Other Ambulatory Visit: Payer: Medicare Other

## 2018-07-18 ENCOUNTER — Ambulatory Visit: Payer: Medicare Other | Admitting: Hematology

## 2018-09-15 ENCOUNTER — Inpatient Hospital Stay: Payer: Medicare Other

## 2018-09-15 ENCOUNTER — Inpatient Hospital Stay: Payer: Medicare Other | Admitting: Hematology

## 2018-09-18 ENCOUNTER — Telehealth: Payer: Self-pay | Admitting: Hematology

## 2018-09-18 NOTE — Telephone Encounter (Signed)
Returned patient's phone call regarding rescheduling an appointment. Lab and follow up appointment has been rescheduled for 06/29.

## 2018-10-13 NOTE — Progress Notes (Signed)
.    HEMATOLOGY/ONCOLOGY CLINIC NOTE  Date of Service: 10/16/18    Patient Care Team: [name], Walter, MD as PCP - General (Internal Medicine)  CHIEF COMPLAINTS/PURPOSE OF CONSULTATION:   F/u for continued management of follicular lymphoma  HISTORY OF PRESENTING ILLNESS:   [name] [name] is a wonderful 83 y.o. female who has been referred to us by Dr .[name], Walter, MD for evaluation and management of newly diagnosed follicular lymphoma.  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 Picou who is a retired neurosurgeon from [employer].  She presented to her primary care physician with 3 months of asymptomatic fullness/swelling in her right upper neck with no overt symptoms of dental pain or swelling, fevers, chills or night sweats.  The swelling was painless and gradually growing.  She was referred to Dr. David [name] (ENT) for further evaluation and had a CT of the neck on 12/08/2015 which showed findings "soft tissue mass measuring up to 3.2 cm which most resembles an abnormal right level 1 B lymph node. This medially displaces but seems to be separate from the adjacent right submandibular gland.There is a larger spiculated and somewhat infiltrative appearing right lateral neck mass centered at the level 2 nodal station measuring up to 4.7 cm. This lesion is inseparable from the undersurface of the right sternocleidomastoid muscle, but does not appear to invade the subjacent right carotid space.  Associated small but asymmetrically enlarged and conspicuous right level 3 lymph nodes. No primary pharyngeal or laryngeal tumor identified."  The patient subsequently had incisional biopsy of the right upper neck lymph node on 12/19/2015 which is consistent with a low-grade follicular lymphoma. Patient has been referred for  evaluation and management for follicular lymphoma. She notes no other areas of obviously enlarged lymph nodes. No constitutional symptoms such as fevers, chills, night sweats, unexpected weight loss. Overall feels well and no differently than she felt 6 months ago.  INTERVAL HISTORY  [name] is here for her scheduled followup for follicular lymphoma.  The patient's last visit with us was on 03/21/18. The pt reports that she is doing well overall.  The pt reports that she has not developed any new concerns in the interim. She endorses good energy levels and denies fevers, chills, night sweats, or unexpected weight loss. She notes that she is eating well. The pt denies noticing any new lumps or bumps. She had her prolia injection this morning and has continued holding forteo. She notes that she continues exercising every day.  Lab results today (10/16/18) of CBC w/diff and CMP is as follows: all values are WNL except for Creatinine at 1.02, GFR at 52. 629/20 LDH is . Lab Results  Component Value Date   LDH 185 10/16/2018   On review of systems, pt reports good energy levels, eating well, staying active, and denies fevers, chills, night sweats, unexpected weight loss, noticing any new lumps or bumps, abdominal pains, leg swelling, new bone pains, new back pains, and any other symptoms.   MEDICAL HISTORY:  Past Medical History:  Diagnosis Date  . Cancer (HCC)    follicular lymphoma  . Cataract   . History of colon polyps    all colonic mucosa  . Hyperlipidemia    on lipitor  . Osteoporosis   Lung carcinoid 05/2006 s/p surgery Colonoscopy recently -   1 polyp Osteoporosis - on forteo, Prolia (2 years)  SURGICAL HISTORY: Past Surgical History:  Procedure Laterality Date  . COLONOSCOPY     1517,6160  . EYE SURGERY Bilateral    cataract removal  . 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 POLYPECTOMY     2001,2006-all colonic mucosa   . SUBMANDIBULAR GLAND  EXCISION Right 12/19/2015   Procedure: BIOPSY OF RIGHT SUBMANDIBULAR MASS;  Surgeon: Jerrell Belfast, MD;  Location: [facility] OR;  Service: ENT;  Laterality: Right;    SOCIAL HISTORY: Social History   Socioeconomic History  . Marital status: Married    Spouse name: Not on file  . Number of children: Not on file  . Years of education: Not on file  . Highest education level: Not on file  Occupational History  . Not on file  Social Needs  . Financial resource strain: Not on file  . Food insecurity    Worry: Not on file    Inability: Not on file  . Transportation needs    Medical: Not on file    Non-medical: Not on file  Tobacco Use  . Smoking status: Never Smoker  . Smokeless tobacco: Never Used  Substance and Sexual Activity  . Alcohol use: No    Alcohol/week: 0.0 standard drinks  . Drug use: No  . Sexual activity: Not on file  Lifestyle  . Physical activity    Days per week: Not on file    Minutes per session: Not on file  . Stress: Not on file  Relationships  . Social Herbalist on phone: Not on file    Gets together: Not on file    Attends religious service: Not on file    Active member of club or organization: Not on file    Attends meetings of clubs or organizations: Not on file    Relationship status: Not on file  . Intimate partner violence    Fear of current or ex partner: Not on file    Emotionally abused: Not on file    Physically abused: Not on file    Forced sexual activity: Not on file  Other Topics Concern  . Not on file  Social History Narrative  . Not on file    FAMILY HISTORY: Family History  Problem Relation Age of Onset  . Colon cancer Neg Hx   . Colon polyps Neg Hx   . Esophageal cancer Neg Hx   . Rectal cancer Neg Hx   . Stomach cancer Neg Hx     ALLERGIES:  is allergic to no known allergies.  MEDICATIONS:  Current Outpatient Medications  Medication Sig Dispense Refill  . aspirin 81 MG tablet Chew 81 mg by mouth daily.    [name]  atorvastatin (LIPITOR) 20 MG tablet Take 20 mg by mouth.    . BD PEN NEEDLE NANO U/F 32G X 4 MM MISC every other day.     . Cholecalciferol (VITAMIN D) 2000 units CAPS Take 2,000 Units by mouth daily.    [name] FORTEO 600 MCG/2.4ML SOLN Inject 20 mcg into the skin daily.    . Multiple Vitamins-Minerals (MULTIVITAMIN WITH MINERALS) tablet Take 1 tablet by mouth.    . Omega-3 Fatty Acids (FISH OIL TRIPLE STRENGTH) 1400 MG CAPS Take 1 capsule by mouth daily.     No current facility-administered medications for this visit.     REVIEW OF SYSTEMS:  A 10+ POINT REVIEW OF SYSTEMS WAS OBTAINED  including neurology, dermatology, psychiatry, cardiac, respiratory, lymph, extremities, GI, GU, Musculoskeletal, constitutional, breasts, reproductive, HEENT.  All pertinent positives are noted in the HPI.  All others are negative.   PHYSICAL EXAMINATION:  ECOG PERFORMANCE STATUS: 1 - Symptomatic but completely ambulatory  . Vitals:   10/16/18 1319  BP: 140/64  Pulse: 77  Resp: 17  Temp: 98.3 F (36.8 C)  SpO2: 96%   Filed Weights   10/16/18 1319  Weight: 117 lb 3.2 oz (53.2 kg)   .Body mass index is 22.14 kg/m.  GENERAL:alert, in no acute distress and comfortable SKIN: no acute rashes, no significant lesions EYES: conjunctiva are pink and non-injected, sclera anicteric OROPHARYNX: MMM, no exudates, no oropharyngeal erythema or ulceration NECK: supple, no JVD LYMPH:  no palpable lymphadenopathy in the cervical, axillary or inguinal regions LUNGS: clear to auscultation b/l with normal respiratory effort HEART: regular rate & rhythm ABDOMEN:  normoactive bowel sounds , non tender, not distended. No palpable hepatosplenomegaly.  Extremity: no pedal edema PSYCH: alert & oriented x 3 with fluent speech NEURO: no focal motor/sensory deficits   LABORATORY DATA:  I have reviewed the data as listed  CBC Latest Ref Rng & Units 10/16/2018 02/21/2018 10/27/2017  WBC 4.0 - 10.5 K/uL 9.1 8.3 7.1   Hemoglobin 12.0 - 15.0 g/dL 14.5 14.4 14.1  Hematocrit 36.0 - 46.0 % 44.9 45.3 42.5  Platelets 150 - 400 K/uL 245 219 215  HGB 13.6  . CMP Latest Ref Rng & Units 10/16/2018 02/21/2018 10/27/2017  Glucose 70 - 99 mg/dL 89 86 79  BUN 8 - 23 mg/dL 18 14 21  Creatinine 0.44 - 1.00 mg/dL 1.02(H) 0.83 0.99  Sodium 135 - 145 mmol/L 142 142 141  Potassium 3.5 - 5.1 mmol/L 4.3 4.2 5.4(H)  Chloride 98 - 111 mmol/L 107 106 104  CO2 22 - 32 mmol/L 25 27 29  Calcium 8.9 - 10.3 mg/dL 9.2 9.7 10.3  Total Protein 6.5 - 8.1 g/dL 7.1 7.2 6.8  Total Bilirubin 0.3 - 1.2 mg/dL 0.4 0.4 0.5  Alkaline Phos 38 - 126 U/L 74 65 88  AST 15 - 41 U/L 23 25 24  ALT 0 - 44 U/L 21 23 22   . Lab Results  Component Value Date   LDH 185 10/16/2018         RADIOGRAPHIC STUDIES: I have personally reviewed the radiological images as listed and agreed with the findings in the report. No results found.  ASSESSMENT & PLAN:   83 y.o. 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 Patient imaging shows that this is at least a stage III A with mesenteric involvement as well. No overt evidence of bone marrow involvement but bone marrow biopsy is not planned for additional staging based on patient preference and the fact that it will not change treatment at this time . LDH level within normal limits FLIPI score - intermediate risk with 5 year OS 78% and ten-year overall survival 51% (from data in the pre-rituxan era) S/p ISRT to cervical LN  Disease. 07/08/17 PET/CT showed slightly increasing activity levels in the mesentery. 02/21/18 CT C/A/P revealed Areas of abnormal, ill-defined soft tissue in the mesentery and along the peritoneal surface identified on previous PET-CT are similar today. No new or progressive interval findings. 2.  Aortic Atherosclerois.   PLAN: -Discussed pt labwork today, 10/16/18; blood counts are  normal and chemistries are stable overall. LDH   is pending. -Recommend Vitamin D replacement for goal of 60 -The pt shows no clinical or lab progression of her follicular lymphoma at this time.  -No indication for further treatment at this time. -Will repeat imaging prior to next visit -Will see the pt back in 6 months  #2 history of lung carcinoid status post surgery in 2008. No concern with recurrence at this time.  #3 Hypercalcemia -likely from Forteo. Less likely from lymphoma. LDH WNL . No other clinical or lab findings suggestive of lymphoma progression at this time. Previously had normalized briefly when she was off the forteo but was placed back on this. Pt stopped Forteo and her calcium has since normalized.   Plan -Calcium normalized on 10/27/17 labs at 10.3, and continue to be normal on labs on 10/16/18   CT chest/abd/pelvis in 24 weeks Labs in 24 weeks RTC with Dr Kale in 6 months with labs and CT   Orders Placed This Encounter  Procedures  . CT Chest W Contrast    Standing Status:   Future    Standing Expiration Date:   10/16/2019    Order Specific Question:   ** REASON FOR EXAM (FREE TEXT)    Answer:   follicular lymphoma to 1 year re-evaluation to evaluation for disease progression.    Order Specific Question:   If indicated for the ordered procedure, I authorize the administration of contrast media per Radiology protocol    Answer:   Yes    Order Specific Question:   Preferred imaging location?    Answer:   [facility]    Order Specific Question:   Radiology Contrast Protocol - do NOT remove file path    Answer:   \\[email or web address]  . CT Abdomen Pelvis W Contrast    Standing Status:   Future    Standing Expiration Date:   10/16/2019    Order Specific Question:   ** REASON FOR EXAM (FREE TEXT)    Answer:   follicular lymphoma 1 yr re-evaluation for disease progression    Order Specific Question:   If indicated for the ordered  procedure, I authorize the administration of contrast media per Radiology protocol    Answer:   Yes    Order Specific Question:   Preferred imaging location?    Answer:   [facility]    Order Specific Question:   Is Oral Contrast requested for this exam?    Answer:   Yes, Per Radiology protocol    Order Specific Question:   Radiology Contrast Protocol - do NOT remove file path    Answer:   \\[email or web address]  . CBC with Differential/Platelet    Standing Status:   Future    Standing Expiration Date:   11/20/2019  . CMP (Cancer Center only)    Standing Status:   Future    Standing Expiration Date:   10/16/2019  . Lactate dehydrogenase    Standing Status:   Future    Standing Expiration Date:   10/16/2019    All of the patients and her husbands questions were answered to their apparent satisfaction. The patient knows to call the clinic with any problems, questions or concerns.  The total time spent in the appt was 20 minutes and more than 50% was on counseling and direct patient cares.    Gautam Kale MD MS AAHIVMS [facility] C[facility] Hematology/Oncology Physician [facility]  (Office):       [phone] (Work cell):  [phone] (Fax):             [phone]  I, Baldwin Jamaica, am acting as a scribe for Dr. Sullivan Lone.   .I have reviewed the above documentation for accuracy and completeness, and I agree with the above. Brunetta Genera MD

## 2018-10-16 ENCOUNTER — Inpatient Hospital Stay: Payer: Medicare Other | Attending: Hematology | Admitting: Hematology

## 2018-10-16 ENCOUNTER — Inpatient Hospital Stay: Payer: Medicare Other

## 2018-10-16 ENCOUNTER — Other Ambulatory Visit: Payer: Self-pay

## 2018-10-16 ENCOUNTER — Telehealth: Payer: Self-pay | Admitting: Hematology

## 2018-10-16 VITALS — BP 140/64 | HR 77 | Temp 98.3°F | Resp 17 | Ht 61.0 in | Wt 117.2 lb

## 2018-10-16 DIAGNOSIS — M810 Age-related osteoporosis without current pathological fracture: Secondary | ICD-10-CM | POA: Diagnosis not present

## 2018-10-16 DIAGNOSIS — Z79899 Other long term (current) drug therapy: Secondary | ICD-10-CM | POA: Diagnosis not present

## 2018-10-16 DIAGNOSIS — C8201 Follicular lymphoma grade I, lymph nodes of head, face, and neck: Secondary | ICD-10-CM | POA: Diagnosis not present

## 2018-10-16 DIAGNOSIS — E785 Hyperlipidemia, unspecified: Secondary | ICD-10-CM

## 2018-10-16 LAB — CBC WITH DIFFERENTIAL/PLATELET
Abs Immature Granulocytes: 0.02 10*3/uL (ref 0.00–0.07)
Basophils Absolute: 0.1 10*3/uL (ref 0.0–0.1)
Basophils Relative: 1 %
Eosinophils Absolute: 0.1 10*3/uL (ref 0.0–0.5)
Eosinophils Relative: 1 %
HCT: 44.9 % (ref 36.0–46.0)
Hemoglobin: 14.5 g/dL (ref 12.0–15.0)
Immature Granulocytes: 0 %
Lymphocytes Relative: 22 %
Lymphs Abs: 2 10*3/uL (ref 0.7–4.0)
MCH: 29.4 pg (ref 26.0–34.0)
MCHC: 32.3 g/dL (ref 30.0–36.0)
MCV: 91.1 fL (ref 80.0–100.0)
Monocytes Absolute: 0.7 10*3/uL (ref 0.1–1.0)
Monocytes Relative: 8 %
Neutro Abs: 6.2 10*3/uL (ref 1.7–7.7)
Neutrophils Relative %: 68 %
Platelets: 245 10*3/uL (ref 150–400)
RBC: 4.93 MIL/uL (ref 3.87–5.11)
RDW: 14.3 % (ref 11.5–15.5)
WBC: 9.1 10*3/uL (ref 4.0–10.5)
nRBC: 0 % (ref 0.0–0.2)

## 2018-10-16 LAB — CMP (CANCER CENTER ONLY)
ALT: 21 U/L (ref 0–44)
AST: 23 U/L (ref 15–41)
Albumin: 4.3 g/dL (ref 3.5–5.0)
Alkaline Phosphatase: 74 U/L (ref 38–126)
Anion gap: 10 (ref 5–15)
BUN: 18 mg/dL (ref 8–23)
CO2: 25 mmol/L (ref 22–32)
Calcium: 9.2 mg/dL (ref 8.9–10.3)
Chloride: 107 mmol/L (ref 98–111)
Creatinine: 1.02 mg/dL — ABNORMAL HIGH (ref 0.44–1.00)
GFR, Est AFR Am: 60 mL/min — ABNORMAL LOW (ref >60)
GFR, Estimated: 52 mL/min — ABNORMAL LOW (ref >60)
Glucose, Bld: 89 mg/dL (ref 70–99)
Potassium: 4.3 mmol/L (ref 3.5–5.1)
Sodium: 142 mmol/L (ref 135–145)
Total Bilirubin: 0.4 mg/dL (ref 0.3–1.2)
Total Protein: 7.1 g/dL (ref 6.5–8.1)

## 2018-10-16 LAB — LACTATE DEHYDROGENASE: LDH: 185 U/L (ref 98–192)

## 2018-10-16 NOTE — Telephone Encounter (Signed)
Scheduled appt per 6/29 los. Printed and mailed calendar. °

## 2018-12-12 DIAGNOSIS — E7800 Pure hypercholesterolemia, unspecified: Secondary | ICD-10-CM | POA: Diagnosis not present

## 2018-12-12 DIAGNOSIS — H1789 Other corneal scars and opacities: Secondary | ICD-10-CM | POA: Diagnosis not present

## 2018-12-12 DIAGNOSIS — Z961 Presence of intraocular lens: Secondary | ICD-10-CM | POA: Diagnosis not present

## 2018-12-12 DIAGNOSIS — H353131 Nonexudative age-related macular degeneration, bilateral, early dry stage: Secondary | ICD-10-CM | POA: Diagnosis not present

## 2018-12-12 DIAGNOSIS — H524 Presbyopia: Secondary | ICD-10-CM | POA: Diagnosis not present

## 2018-12-12 DIAGNOSIS — E785 Hyperlipidemia, unspecified: Secondary | ICD-10-CM | POA: Diagnosis not present

## 2018-12-14 DIAGNOSIS — Z0000 Encounter for general adult medical examination without abnormal findings: Secondary | ICD-10-CM | POA: Diagnosis not present

## 2018-12-14 DIAGNOSIS — Z7982 Long term (current) use of aspirin: Secondary | ICD-10-CM | POA: Diagnosis not present

## 2018-12-14 DIAGNOSIS — Z23 Encounter for immunization: Secondary | ICD-10-CM | POA: Diagnosis not present

## 2018-12-14 DIAGNOSIS — M810 Age-related osteoporosis without current pathological fracture: Secondary | ICD-10-CM | POA: Diagnosis not present

## 2018-12-14 DIAGNOSIS — E7800 Pure hypercholesterolemia, unspecified: Secondary | ICD-10-CM | POA: Diagnosis not present

## 2018-12-14 DIAGNOSIS — R1310 Dysphagia, unspecified: Secondary | ICD-10-CM | POA: Diagnosis not present

## 2018-12-14 DIAGNOSIS — C8201 Follicular lymphoma grade I, lymph nodes of head, face, and neck: Secondary | ICD-10-CM | POA: Diagnosis not present

## 2018-12-14 DIAGNOSIS — E875 Hyperkalemia: Secondary | ICD-10-CM | POA: Diagnosis not present

## 2018-12-18 ENCOUNTER — Other Ambulatory Visit: Payer: Self-pay | Admitting: Internal Medicine

## 2018-12-18 DIAGNOSIS — R1310 Dysphagia, unspecified: Secondary | ICD-10-CM

## 2018-12-19 ENCOUNTER — Ambulatory Visit
Admission: RE | Admit: 2018-12-19 | Discharge: 2018-12-19 | Disposition: A | Discharge status: Home / Self Care | Payer: Medicare Other | Source: Ambulatory Visit | Attending: Internal Medicine | Admitting: Internal Medicine

## 2018-12-19 DIAGNOSIS — K219 Gastro-esophageal reflux disease without esophagitis: Secondary | ICD-10-CM | POA: Diagnosis not present

## 2018-12-19 DIAGNOSIS — R1310 Dysphagia, unspecified: Secondary | ICD-10-CM

## 2018-12-19 DIAGNOSIS — K222 Esophageal obstruction: Secondary | ICD-10-CM | POA: Diagnosis not present

## 2018-12-19 IMAGING — RF DG ESOPHAGUS
8 of 9 series · 14 of 24 positions shown · non-contrast
Comparison: 02/21/2018 CT chest, abdomen and pelvis.

CLINICAL DATA: Solid food sticking in throat and upper chest.

EXAM:
ESOPHOGRAM / BARIUM SWALLOW / BARIUM TABLET STUDY
TECHNIQUE: Combined double contrast and single contrast examination performed
using effervescent crystals, thick barium liquid, and thin barium
liquid. The patient was observed with fluoroscopy swallowing a 13 mm
barium sulphate tablet.
FLUOROSCOPY TIME:  Fluoroscopy Time:  2 minutes 30 seconds
Radiation Exposure Index (if provided by the fluoroscopic device):
45 mg
Number of Acquired Spot Images: 8

[Series 1: sequence · 2 of 16 frames shown (1 of 7)]
[frame 3/16]
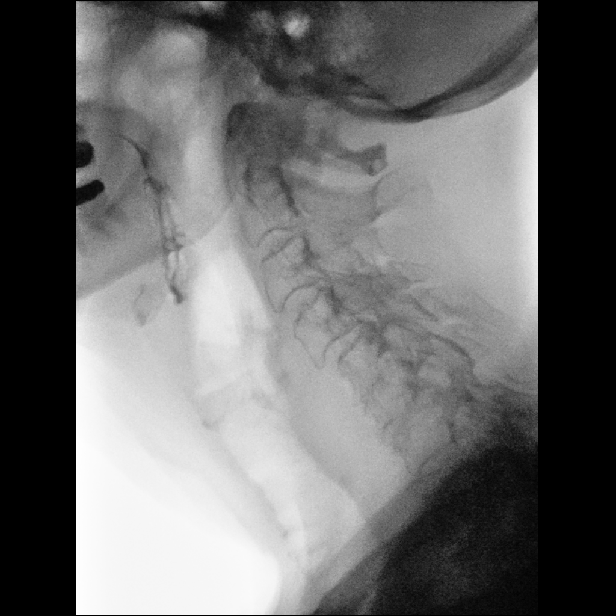
[frame 14/16]
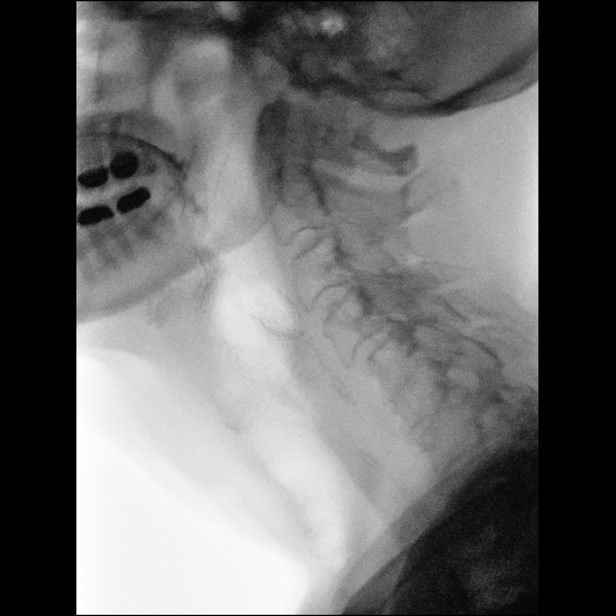

[Series 3: sequence · 1 of 13 frames shown (2 of 7)]
[frame 7/13]
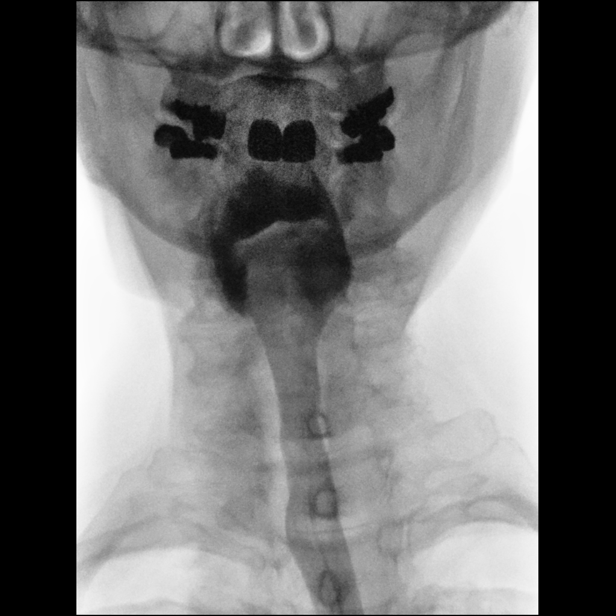

[Series 4: one shot · 0.15mm/px · 3 of 7 slices shown]
[im 2/7]
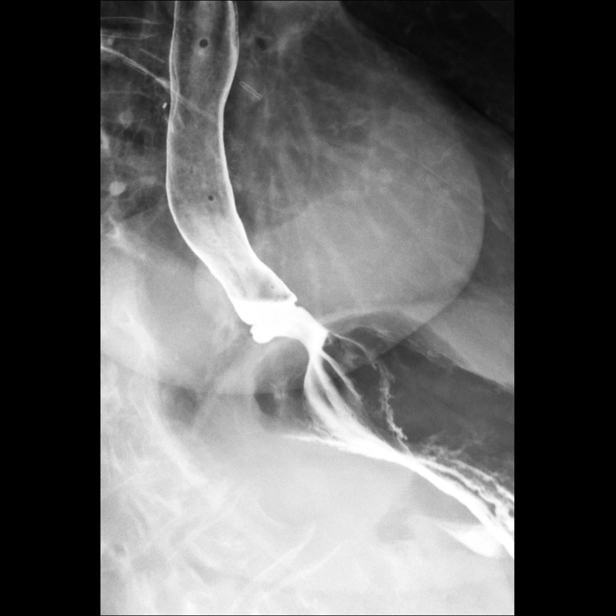
[im 3/7]
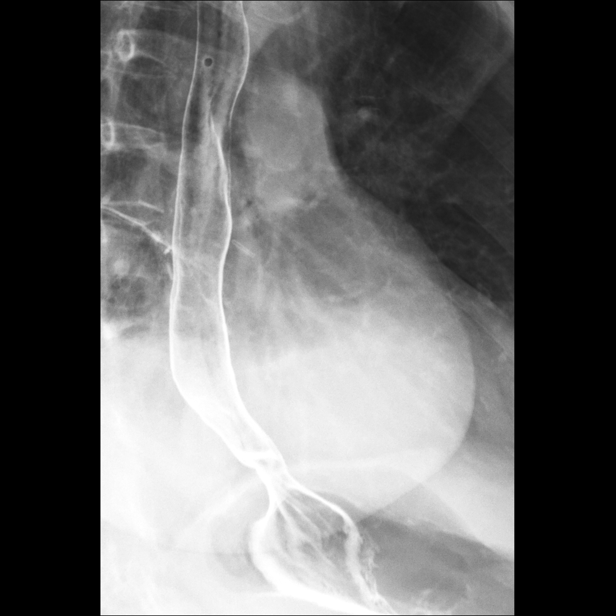
[im 6/7]
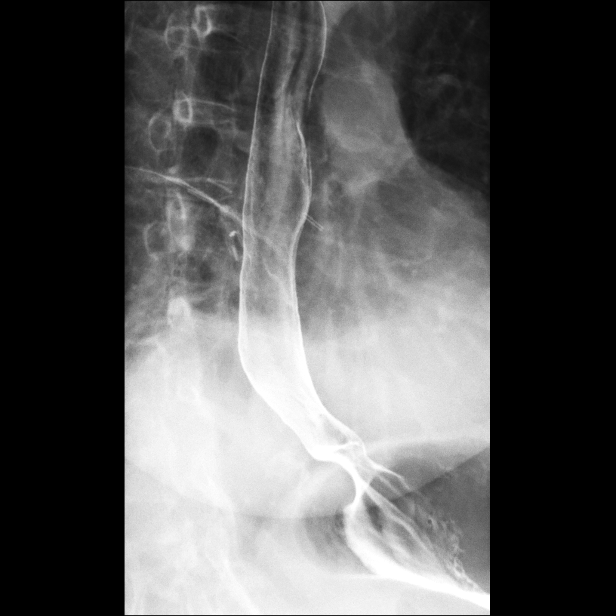

[Series 5: sequence · 2 of 27 frames shown (3 of 7)]
[frame 14/27]
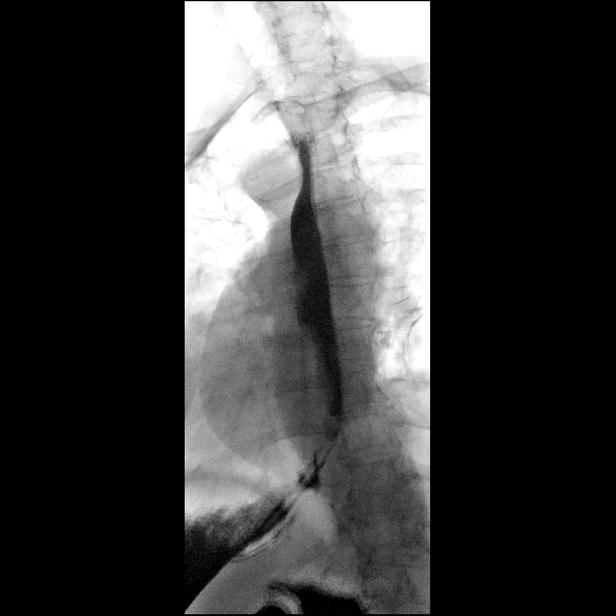
[frame 23/27]
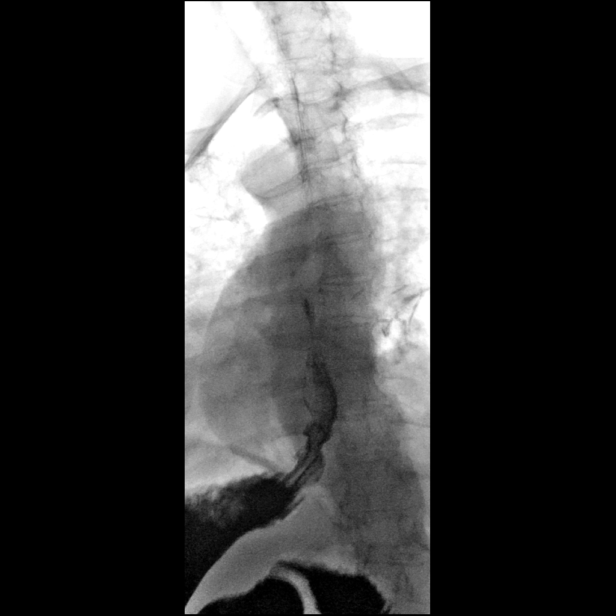

[Series 6: sequence · 1 of 103 frames shown (4 of 7)]
[frame 82/103]
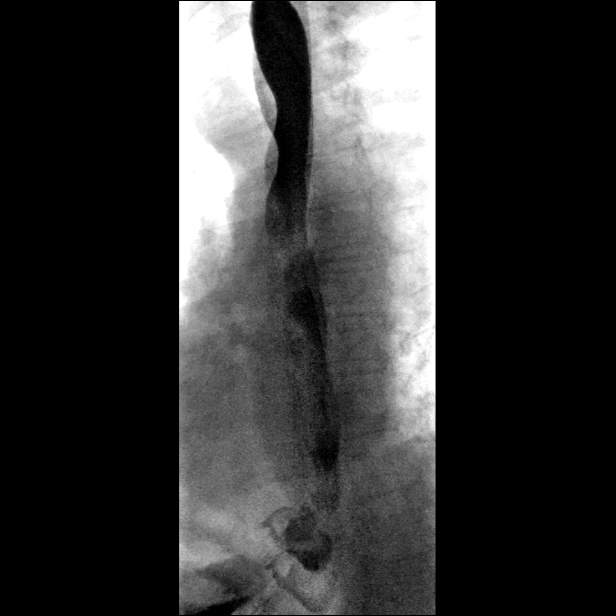

[Series 7: sequence · 1 of 7 frames shown (5 of 7)]
[frame 3/7]
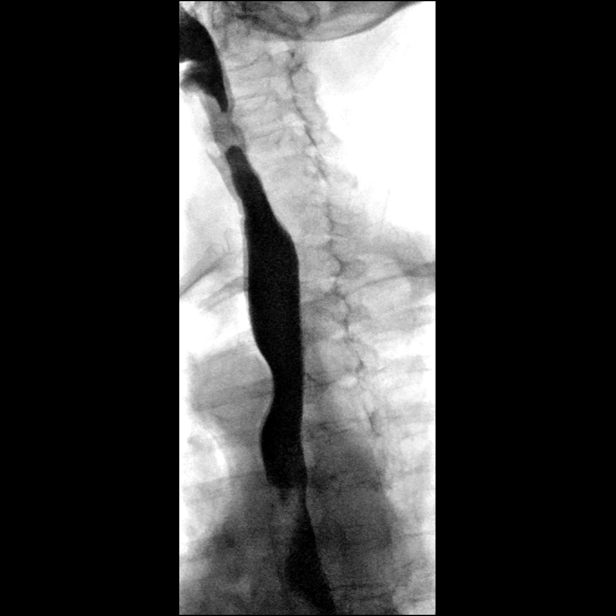

[Series 8: sequence · 2 of 31 frames shown (6 of 7)]
[frame 5/31]
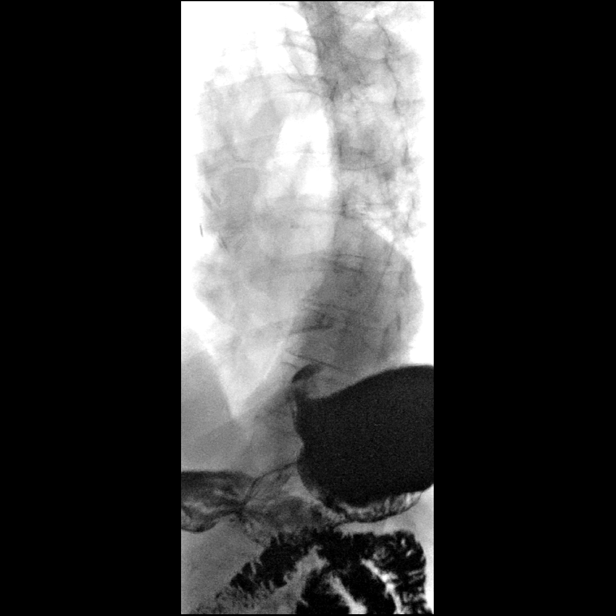
[frame 16/31]
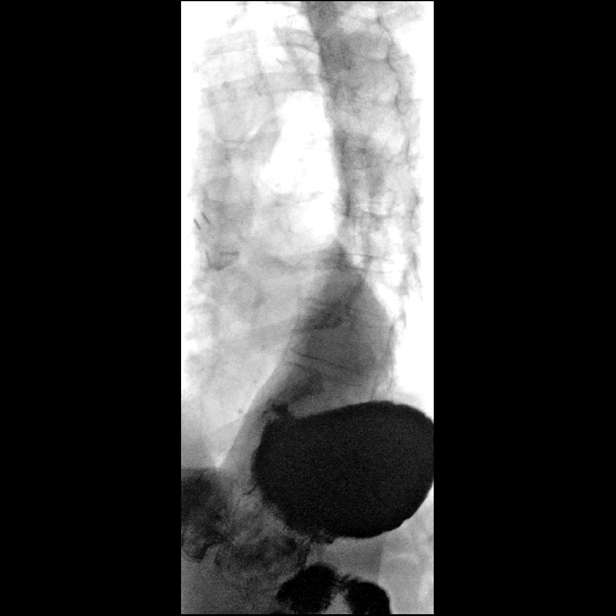

[Series 9: sequence · 2 of 29 frames shown (7 of 7)]
[frame 5/29]
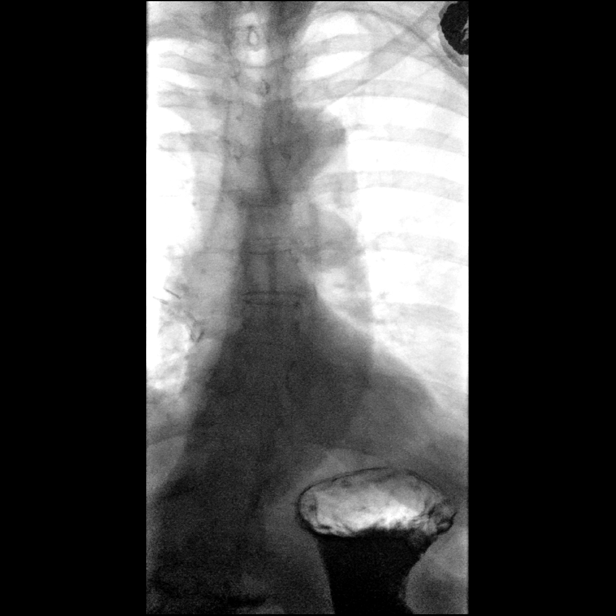
[frame 25/29]
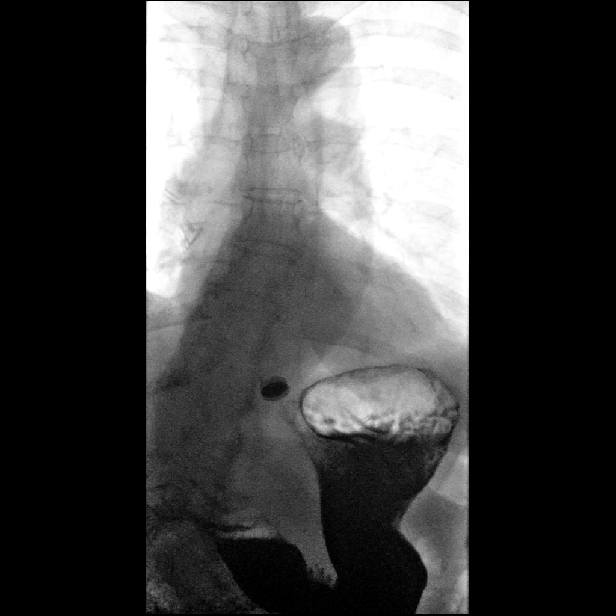

[14 of 24 positions shown; findings below may reference images not displayed]

FINDINGS: Oral and pharyngeal phases of swallowing are normal, with no
laryngeal penetration or tracheobronchial aspiration. No significant
barium retention in the pharynx. No evidence of pharyngeal mass,
stricture or diverticulum. No significant cricopharyngeus muscle
dysfunction.

Normal esophageal motility. Small hiatal hernia. Mild
gastroesophageal reflux elicited to the level of the mid to lower
thoracic esophagus with water siphon test. A small Schatzki ring is
present at the esophagogastric junction. The barium tablet traversed
the esophagus into the stomach without significant delay. Otherwise
normal esophageal distensibility, with no evidence of esophageal
mass or ulcer. Normal esophageal mucosa, with no evidence of reflux
esophagitis.
IMPRESSION: 1. Small hiatal hernia.  Mild gastroesophageal reflux elicited.
2. Normal esophageal motility.
3. Small Schatzki ring at the esophagogastric junction, not
currently clinically significant as the barium tablet traversed this
region into the stomach without delay.
4. No evidence of reflux esophagitis. No evidence of esophageal mass
or ulcer.

## 2018-12-21 DIAGNOSIS — Z1212 Encounter for screening for malignant neoplasm of rectum: Secondary | ICD-10-CM | POA: Diagnosis not present

## 2019-02-05 ENCOUNTER — Telehealth: Payer: Self-pay | Admitting: Hematology

## 2019-02-05 NOTE — Telephone Encounter (Signed)
Returned call re rescheduling 10/28 appointment. Not able to reach patient at home phone or leave message. Left message on cell - patient identified and asked that patient call back to let us know when she can see GK.

## 2019-02-05 NOTE — Telephone Encounter (Signed)
Patient called to reschedule 12/28 f/u to 12/29.

## 2019-02-13 ENCOUNTER — Other Ambulatory Visit: Payer: Self-pay

## 2019-02-13 DIAGNOSIS — Z20828 Contact with and (suspected) exposure to other viral communicable diseases: Secondary | ICD-10-CM | POA: Diagnosis not present

## 2019-02-13 DIAGNOSIS — Z20822 Contact with and (suspected) exposure to covid-19: Secondary | ICD-10-CM

## 2019-02-14 DIAGNOSIS — M810 Age-related osteoporosis without current pathological fracture: Secondary | ICD-10-CM | POA: Diagnosis not present

## 2019-02-14 LAB — NOVEL CORONAVIRUS, NAA: SARS-CoV-2, NAA: NOT DETECTED

## 2019-02-21 DIAGNOSIS — M810 Age-related osteoporosis without current pathological fracture: Secondary | ICD-10-CM | POA: Diagnosis not present

## 2019-02-21 DIAGNOSIS — C8209 Follicular lymphoma grade I, extranodal and solid organ sites: Secondary | ICD-10-CM | POA: Diagnosis not present

## 2019-02-21 DIAGNOSIS — C8203 Follicular lymphoma grade I, intra-abdominal lymph nodes: Secondary | ICD-10-CM | POA: Diagnosis not present

## 2019-02-21 DIAGNOSIS — E7800 Pure hypercholesterolemia, unspecified: Secondary | ICD-10-CM | POA: Diagnosis not present

## 2019-04-02 ENCOUNTER — Telehealth: Payer: Self-pay | Admitting: Hematology

## 2019-04-02 ENCOUNTER — Other Ambulatory Visit: Payer: Self-pay

## 2019-04-02 ENCOUNTER — Ambulatory Visit (HOSPITAL_COMMUNITY)
Admission: RE | Admit: 2019-04-02 | Discharge: 2019-04-02 | Disposition: A | Discharge status: Home / Self Care | Payer: Medicare Other | Source: Ambulatory Visit | Attending: Hematology | Admitting: Hematology

## 2019-04-02 ENCOUNTER — Inpatient Hospital Stay: Payer: Medicare Other | Attending: Hematology

## 2019-04-02 DIAGNOSIS — Z7982 Long term (current) use of aspirin: Secondary | ICD-10-CM | POA: Diagnosis not present

## 2019-04-02 DIAGNOSIS — Z79899 Other long term (current) drug therapy: Secondary | ICD-10-CM | POA: Diagnosis not present

## 2019-04-02 DIAGNOSIS — E785 Hyperlipidemia, unspecified: Secondary | ICD-10-CM | POA: Insufficient documentation

## 2019-04-02 DIAGNOSIS — C8201 Follicular lymphoma grade I, lymph nodes of head, face, and neck: Secondary | ICD-10-CM | POA: Diagnosis not present

## 2019-04-02 DIAGNOSIS — M810 Age-related osteoporosis without current pathological fracture: Secondary | ICD-10-CM | POA: Insufficient documentation

## 2019-04-02 DIAGNOSIS — I700 Atherosclerosis of aorta: Secondary | ICD-10-CM | POA: Diagnosis not present

## 2019-04-02 LAB — CMP (CANCER CENTER ONLY)
ALT: 20 U/L (ref 0–44)
AST: 22 U/L (ref 15–41)
Albumin: 4.3 g/dL (ref 3.5–5.0)
Alkaline Phosphatase: 55 U/L (ref 38–126)
Anion gap: 9 (ref 5–15)
BUN: 17 mg/dL (ref 8–23)
CO2: 28 mmol/L (ref 22–32)
Calcium: 9.4 mg/dL (ref 8.9–10.3)
Chloride: 106 mmol/L (ref 98–111)
Creatinine: 0.94 mg/dL (ref 0.44–1.00)
GFR, Est AFR Am: >60 mL/min (ref >60)
GFR, Estimated: 56 mL/min — ABNORMAL LOW (ref >60)
Glucose, Bld: 90 mg/dL (ref 70–99)
Potassium: 4.1 mmol/L (ref 3.5–5.1)
Sodium: 143 mmol/L (ref 135–145)
Total Bilirubin: 0.5 mg/dL (ref 0.3–1.2)
Total Protein: 7.2 g/dL (ref 6.5–8.1)

## 2019-04-02 LAB — CBC WITH DIFFERENTIAL/PLATELET
Abs Immature Granulocytes: 0.01 10*3/uL (ref 0.00–0.07)
Basophils Absolute: 0.1 10*3/uL (ref 0.0–0.1)
Basophils Relative: 1 %
Eosinophils Absolute: 0.1 10*3/uL (ref 0.0–0.5)
Eosinophils Relative: 2 %
HCT: 46.1 % — ABNORMAL HIGH (ref 36.0–46.0)
Hemoglobin: 15 g/dL (ref 12.0–15.0)
Immature Granulocytes: 0 %
Lymphocytes Relative: 27 %
Lymphs Abs: 2.2 10*3/uL (ref 0.7–4.0)
MCH: 29.8 pg (ref 26.0–34.0)
MCHC: 32.5 g/dL (ref 30.0–36.0)
MCV: 91.7 fL (ref 80.0–100.0)
Monocytes Absolute: 0.6 10*3/uL (ref 0.1–1.0)
Monocytes Relative: 8 %
Neutro Abs: 5 10*3/uL (ref 1.7–7.7)
Neutrophils Relative %: 62 %
Platelets: 230 10*3/uL (ref 150–400)
RBC: 5.03 MIL/uL (ref 3.87–5.11)
RDW: 14.2 % (ref 11.5–15.5)
WBC: 8 10*3/uL (ref 4.0–10.5)
nRBC: 0 % (ref 0.0–0.2)

## 2019-04-02 LAB — LACTATE DEHYDROGENASE: LDH: 198 U/L — ABNORMAL HIGH (ref 98–192)

## 2019-04-02 IMAGING — CT CT ABD-PELV W/ CM
2 of 5 series · 12 of 36 positions shown, 15 images · IV contrast (APPLIED)
Comparison: 02/21/2018

CLINICAL DATA: Follicular lymphoma

EXAM:
CT CHEST, ABDOMEN, AND PELVIS WITH CONTRAST
TECHNIQUE: Multidetector CT imaging of the chest, abdomen and pelvis was
performed following the standard protocol during bolus
administration of intravenous contrast.
CONTRAST:  100mL OMNIPAQUE IOHEXOL 300 MG/ML  SOLN

[Series 2: cap with · axial · 0.73mm/px · z∈[-583,-143]mm · 9 of 110 slices shown, 12 images]
[im 11/110  mediastinal]
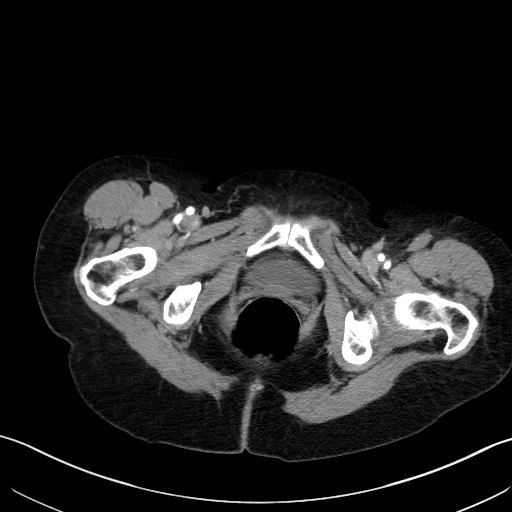
[im 11/110  lung]
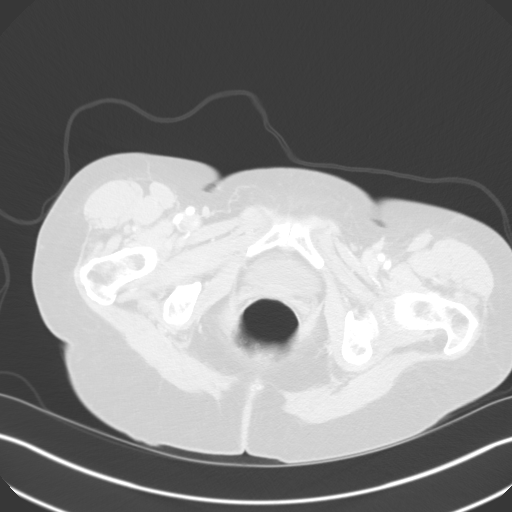
[im 22/110  lung]
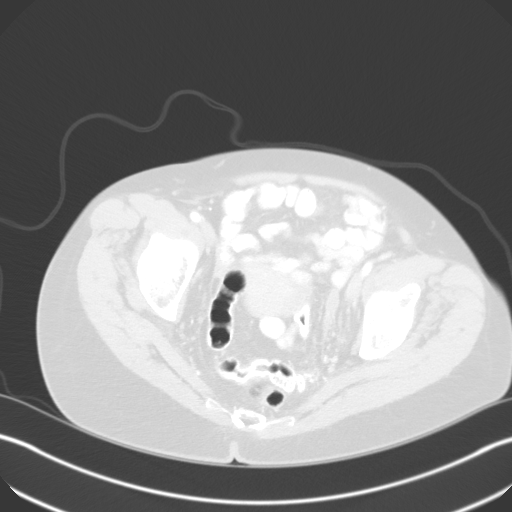
[im 33/110  lung]
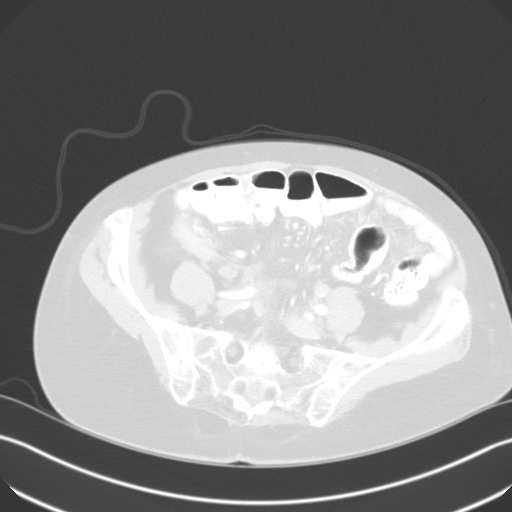
[im 44/110  lung]
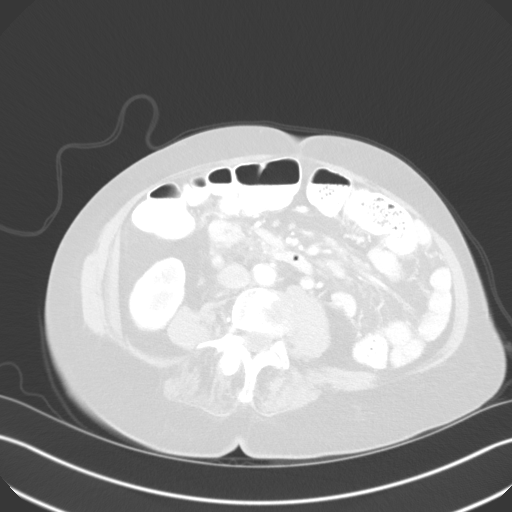
[im 55/110  mediastinal]
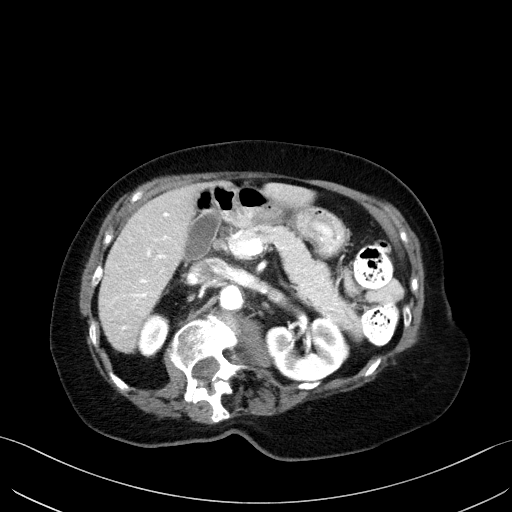
[im 55/110  lung]
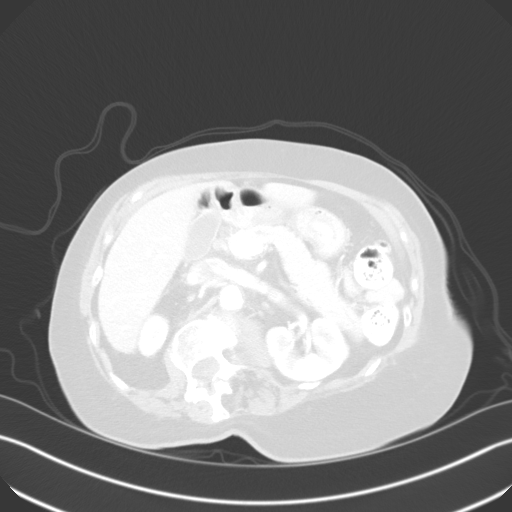
[im 66/110  lung]
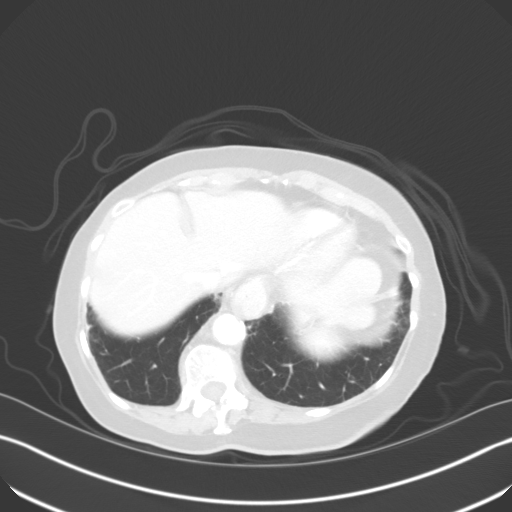
[im 77/110  lung]
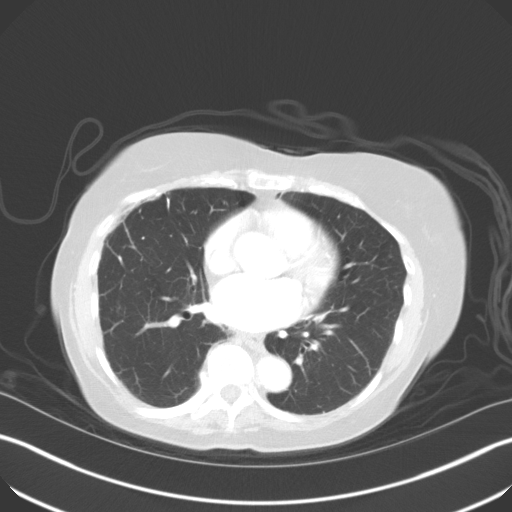
[im 88/110  lung]
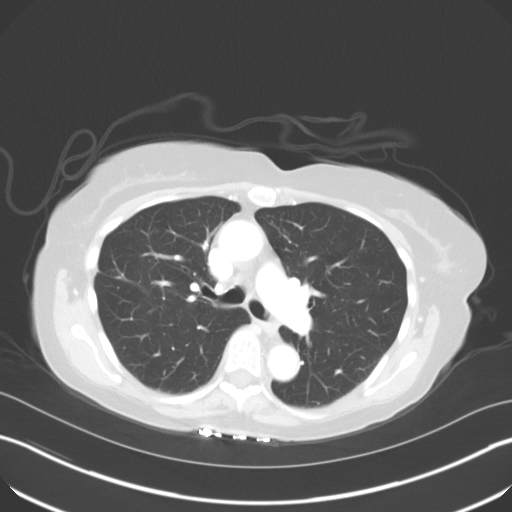
[im 99/110  mediastinal]
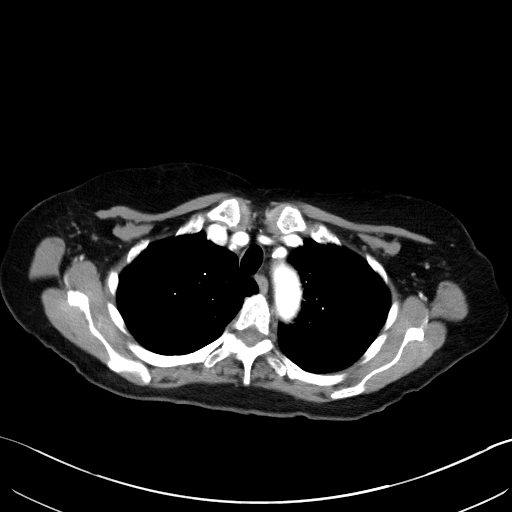
[im 99/110  lung]
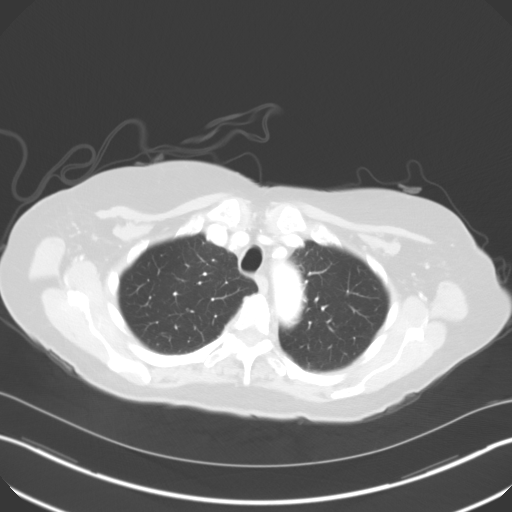

[Series 5: coronals · coronal · 0.65mm/px · 3 of 113 slices shown]
[im 23/113  lung]
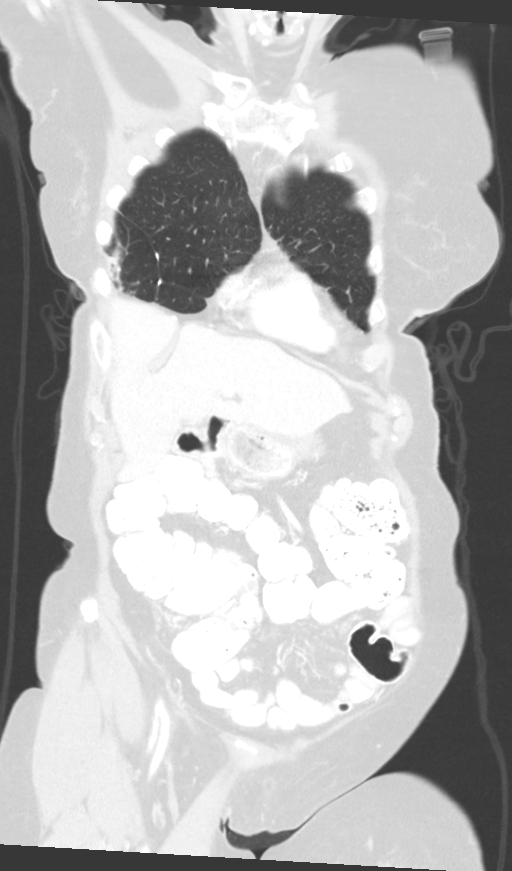
[im 45/113  lung]
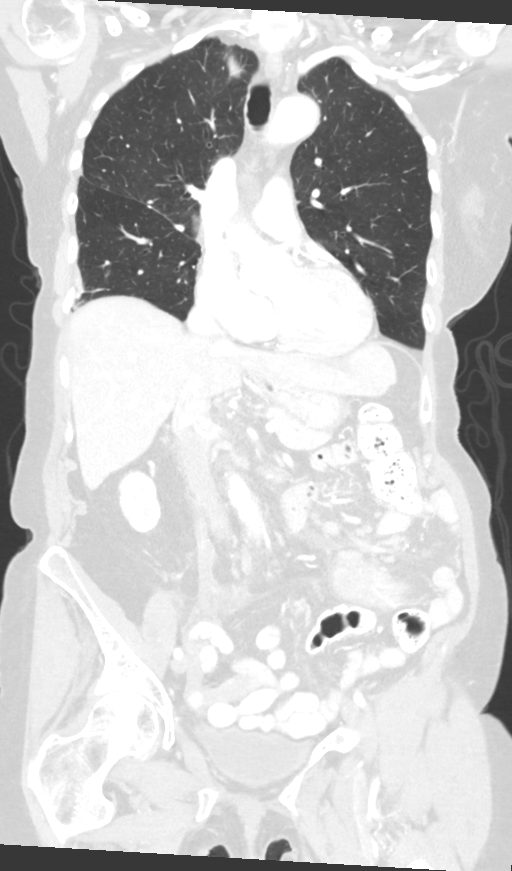
[im 68/113  lung]
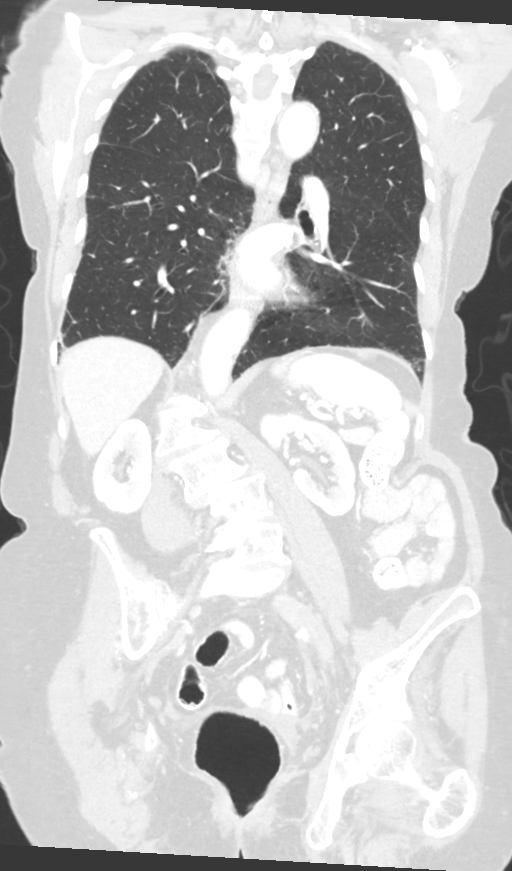

[12 of 36 positions shown; findings below may reference images not displayed]

FINDINGS: CT CHEST FINDINGS

Cardiovascular: Aorta is of normal caliber with scattered calcific
and noncalcific atherosclerotic changes throughout. Heart size is
normal without pericardial effusion. Central pulmonary vasculature
is normal.

Mediastinum/Nodes: No signs of adenopathy in the chest. Thoracic
inlet structures are normal. Small hiatal hernia, soften gets is
normal.

Lungs/Pleura: Signs of partial lung resection as before in the right
chest with surgical clips at the right hilum, findings are
unchanged. No signs of suspicious mass or nodule. No signs of
consolidation or evidence of pleural effusion.

Musculoskeletal: No signs of chest wall lesion.

CT ABDOMEN PELVIS FINDINGS

Hepatobiliary: No signs of focal, suspicious hepatic lesion.
Gallbladder and biliary tree is unremarkable.

Pancreas: Pancreas is normal. No signs of focal lesion, mild
pancreatic ductal distension is unchanged.

Spleen: Spleen is unremarkable, normal size unchanged from prior
exam.

Adrenals/Urinary Tract: Normal adrenal glands. Symmetric enhancement
of the bilateral kidneys. Small cyst in the interpolar right kidney
is unremarkable. Urinary bladder is under distended otherwise
unremarkable.

Stomach/Bowel: Small hiatal hernia. No signs of bowel obstruction or
acute bowel process. Some stranding in the jejunal mesentery with
soft tissue along mesenteric reflections, largest area measuring 6 x
2.7 cm unchanged accounting for differences in mesenteric position
compared to the prior study. No sign of acute gastrointestinal
process.

Soft tissue in the right lower quadrant adjacent to the right
hemicolon measuring 3.9 x 1.8 cm also unchanged accounting for
differences in colonic position as compared to the previous exam.

Vascular/Lymphatic: Calcific and noncalcific atherosclerotic change
throughout the abdominal aorta no signs of aneurysm. No discrete
adenopathy in the retroperitoneum. Small lymph nodes anterior to the
inferior vena cava and in the upper abdomen are stable.

Soft tissue in the upper pelvis anterior to the left common iliac
vein adjacent to the right common iliac artery measuring
approximately 9 mm in greatest thickness, previously 8-9 mm.

Reproductive: Uterus remains in situ no adnexal mass.

Other: No abdominal wall hernia or abnormality. No abdominopelvic
ascites.

Musculoskeletal: No signs of acute bone finding or evidence of
destructive bone process. Rotary dextroconvex scoliotic curvature
centered about the lower thoracic spine is similar to the prior
study.
IMPRESSION: Postoperative changes of right middle lobectomy as before.

No signs of new adenopathy with stable perienteric and pericolonic
soft tissue.

Stable lower retroperitoneal soft tissue along the common iliac
vasculature.

Stable mild pancreatic ductal distension, attention on follow-up

Aortic Atherosclerosis (X3IKE-JGK.K).

## 2019-04-02 IMAGING — CT CT CHEST W/ CM
2 of 5 series · 12 of 36 positions shown, 15 images · IV contrast (APPLIED)
Comparison: 02/21/2018

CLINICAL DATA: Follicular lymphoma

EXAM:
CT CHEST, ABDOMEN, AND PELVIS WITH CONTRAST
TECHNIQUE: Multidetector CT imaging of the chest, abdomen and pelvis was
performed following the standard protocol during bolus
administration of intravenous contrast.
CONTRAST:  100mL OMNIPAQUE IOHEXOL 300 MG/ML  SOLN

[Series 2: cap with · axial · 0.73mm/px · z∈[-583,-143]mm · 9 of 110 slices shown, 12 images]
[im 11/110  mediastinal]
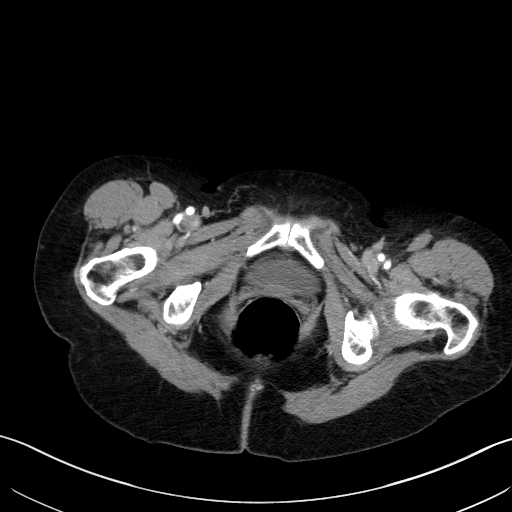
[im 11/110  lung]
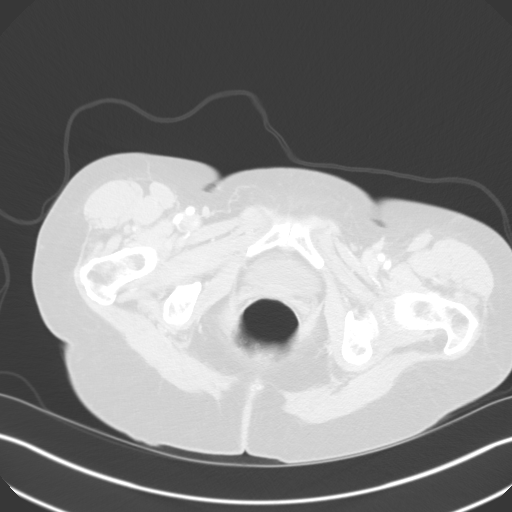
[im 22/110  lung]
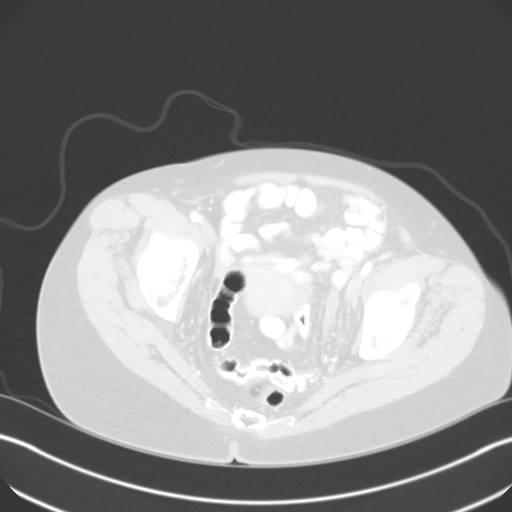
[im 33/110  lung]
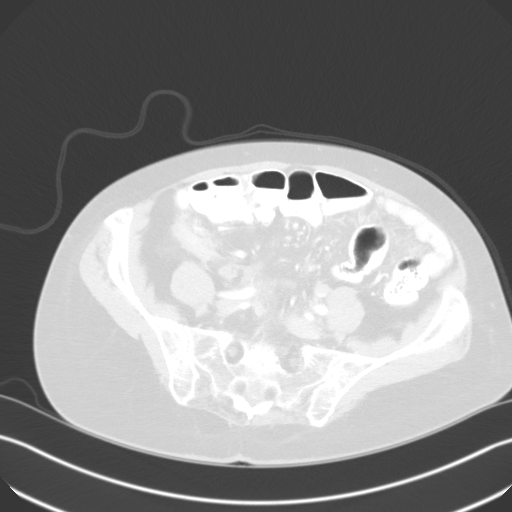
[im 44/110  lung]
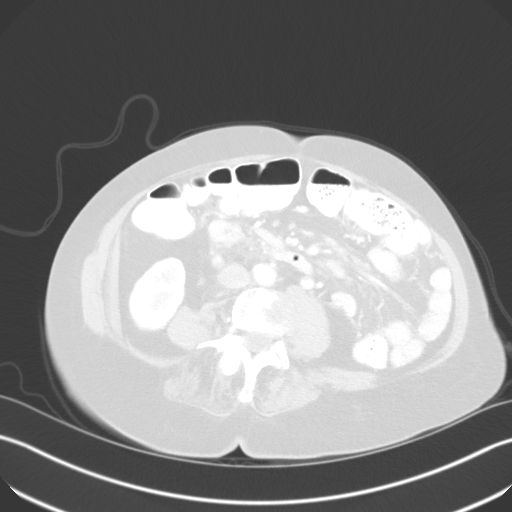
[im 55/110  mediastinal]
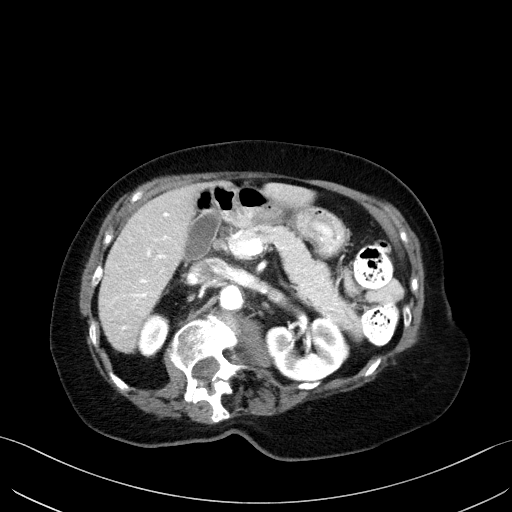
[im 55/110  lung]
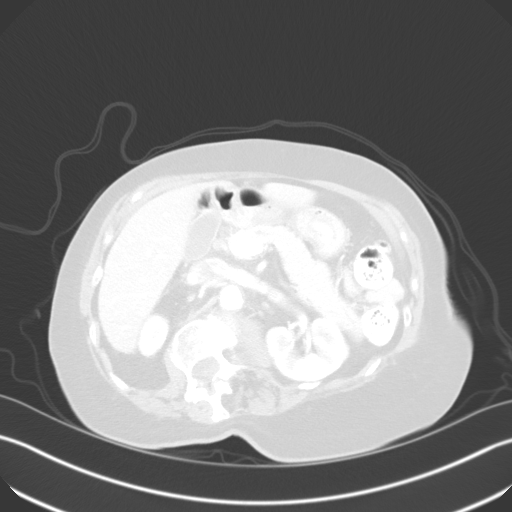
[im 66/110  lung]
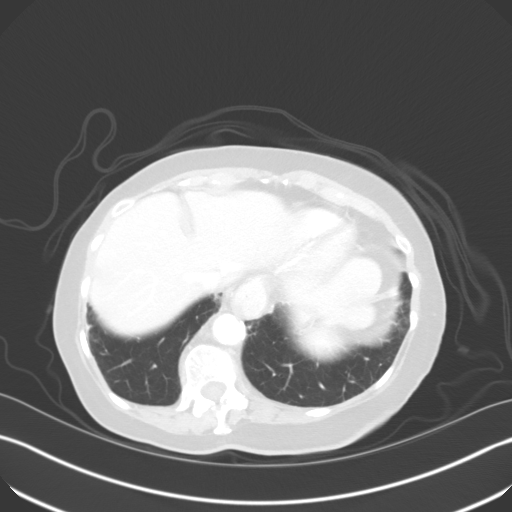
[im 77/110  lung]
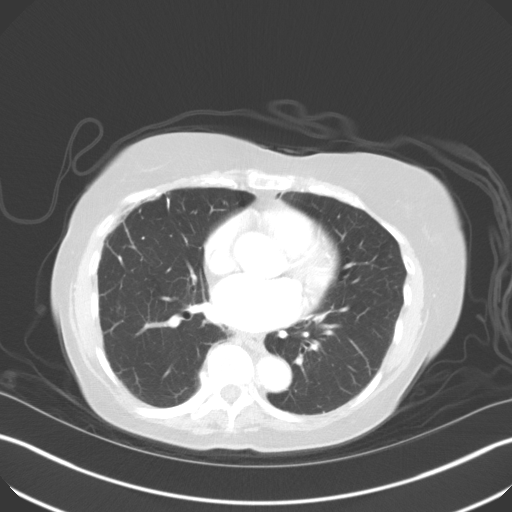
[im 88/110  lung]
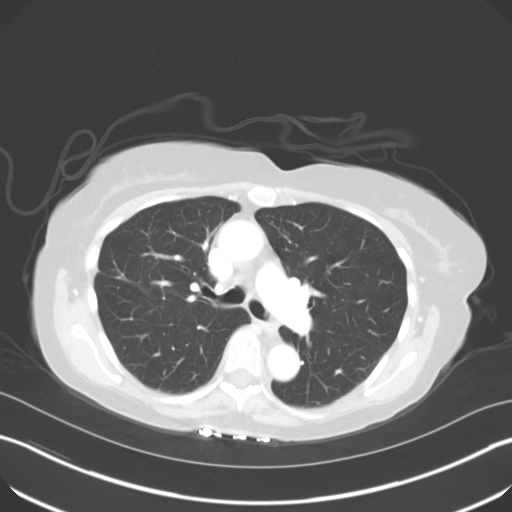
[im 99/110  mediastinal]
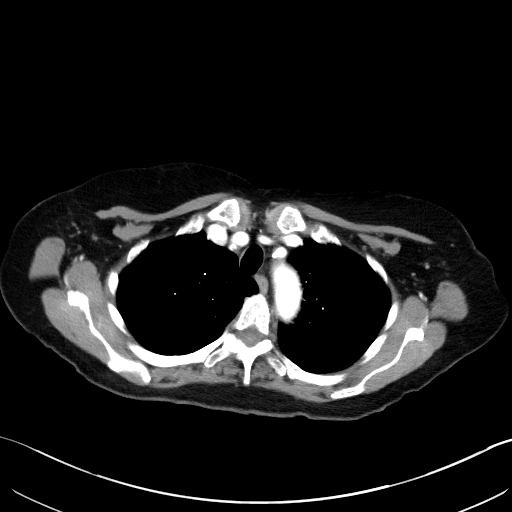
[im 99/110  lung]
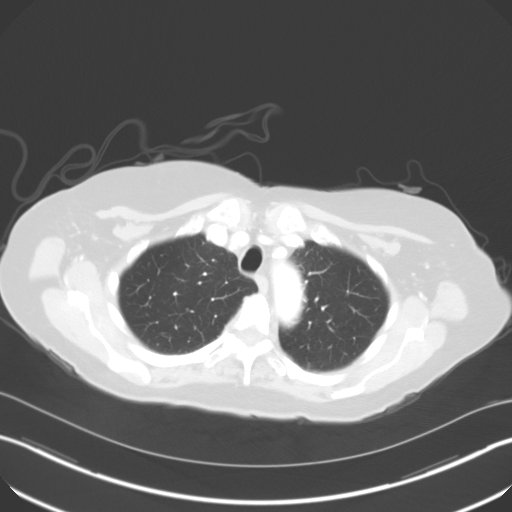

[Series 5: coronals · coronal · 0.65mm/px · 3 of 113 slices shown]
[im 23/113  lung]
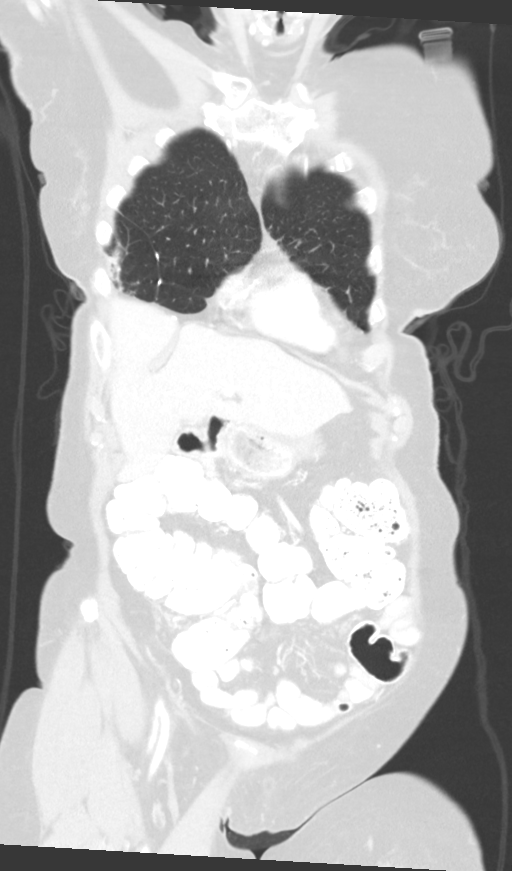
[im 45/113  lung]
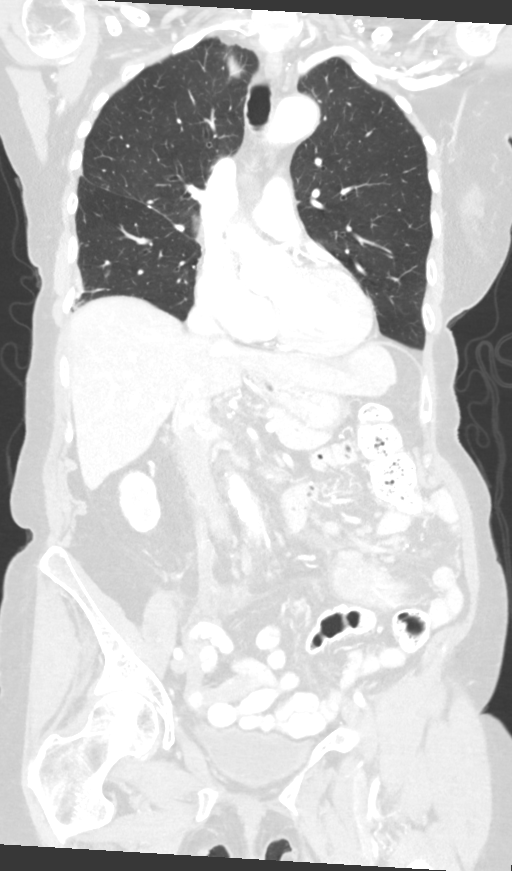
[im 68/113  lung]
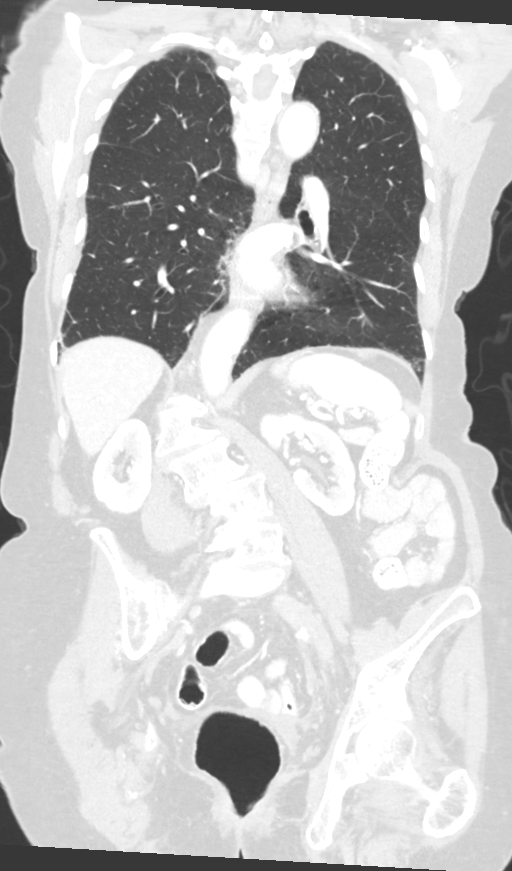

[12 of 36 positions shown; findings below may reference images not displayed]

FINDINGS: CT CHEST FINDINGS

Cardiovascular: Aorta is of normal caliber with scattered calcific
and noncalcific atherosclerotic changes throughout. Heart size is
normal without pericardial effusion. Central pulmonary vasculature
is normal.

Mediastinum/Nodes: No signs of adenopathy in the chest. Thoracic
inlet structures are normal. Small hiatal hernia, soften gets is
normal.

Lungs/Pleura: Signs of partial lung resection as before in the right
chest with surgical clips at the right hilum, findings are
unchanged. No signs of suspicious mass or nodule. No signs of
consolidation or evidence of pleural effusion.

Musculoskeletal: No signs of chest wall lesion.

CT ABDOMEN PELVIS FINDINGS

Hepatobiliary: No signs of focal, suspicious hepatic lesion.
Gallbladder and biliary tree is unremarkable.

Pancreas: Pancreas is normal. No signs of focal lesion, mild
pancreatic ductal distension is unchanged.

Spleen: Spleen is unremarkable, normal size unchanged from prior
exam.

Adrenals/Urinary Tract: Normal adrenal glands. Symmetric enhancement
of the bilateral kidneys. Small cyst in the interpolar right kidney
is unremarkable. Urinary bladder is under distended otherwise
unremarkable.

Stomach/Bowel: Small hiatal hernia. No signs of bowel obstruction or
acute bowel process. Some stranding in the jejunal mesentery with
soft tissue along mesenteric reflections, largest area measuring 6 x
2.7 cm unchanged accounting for differences in mesenteric position
compared to the prior study. No sign of acute gastrointestinal
process.

Soft tissue in the right lower quadrant adjacent to the right
hemicolon measuring 3.9 x 1.8 cm also unchanged accounting for
differences in colonic position as compared to the previous exam.

Vascular/Lymphatic: Calcific and noncalcific atherosclerotic change
throughout the abdominal aorta no signs of aneurysm. No discrete
adenopathy in the retroperitoneum. Small lymph nodes anterior to the
inferior vena cava and in the upper abdomen are stable.

Soft tissue in the upper pelvis anterior to the left common iliac
vein adjacent to the right common iliac artery measuring
approximately 9 mm in greatest thickness, previously 8-9 mm.

Reproductive: Uterus remains in situ no adnexal mass.

Other: No abdominal wall hernia or abnormality. No abdominopelvic
ascites.

Musculoskeletal: No signs of acute bone finding or evidence of
destructive bone process. Rotary dextroconvex scoliotic curvature
centered about the lower thoracic spine is similar to the prior
study.
IMPRESSION: Postoperative changes of right middle lobectomy as before.

No signs of new adenopathy with stable perienteric and pericolonic
soft tissue.

Stable lower retroperitoneal soft tissue along the common iliac
vasculature.

Stable mild pancreatic ductal distension, attention on follow-up

Aortic Atherosclerosis (X3IKE-JGK.K).

## 2019-04-02 MED ORDER — IOHEXOL 350 MG/ML SOLN: 100.0000 mL | Freq: Once | INTRAVENOUS | Status: DC | PRN

## 2019-04-02 MED ORDER — IOHEXOL 300 MG/ML  SOLN
100.0000 mL | Freq: Once | INTRAMUSCULAR | Status: AC | PRN
  Administered 2019-04-02: 100 mL via INTRAVENOUS

## 2019-04-02 MED ORDER — SODIUM CHLORIDE (PF) 0.9 % IJ SOLN
INTRAMUSCULAR | Status: AC
  Filled 2019-04-02: qty 50

## 2019-04-02 NOTE — Telephone Encounter (Signed)
[place] PAL 12/29 moved fu to 12/16. Confirmed with patient. Patient had lab/scan 12/14.

## 2019-04-03 NOTE — Progress Notes (Signed)
[name]    HEMATOLOGY/ONCOLOGY CLINIC NOTE  Date of Service: 04/04/19    Patient Care Team: Deland Pretty, MD as PCP - General (Internal Medicine)  CHIEF COMPLAINTS/PURPOSE OF CONSULTATION:   F/u for continued management of follicular lymphoma  HISTORY OF PRESENTING ILLNESS:   [name] [name] is a wonderful 84 y.o. female who has been referred to [place] by Dr .Deland Pretty, MD for evaluation and management of newly diagnosed follicular lymphoma.  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 Laverta Baltimore who is a retired Publishing rights manager from [place], [place].  She presented to her primary care physician with 3 months of asymptomatic fullness/swelling in her right upper neck with no overt symptoms of dental pain or swelling, fevers, chills or night sweats.  The swelling was painless and gradually growing.  She was referred to Dr. Jerrell Belfast (ENT) for further evaluation and had a CT of the neck on 12/08/2015 which showed findings "soft tissue mass measuring up to 3.2 cm which most resembles an abnormal right level 1 B lymph node. This medially displaces but seems to be separate from the adjacent right submandibular gland.There is a larger spiculated and somewhat infiltrative appearing right lateral neck mass centered at the level 2 nodal station measuring up to 4.7 cm. This lesion is inseparable from the undersurface of the right sternocleidomastoid muscle, but does not appear to invade the subjacent right carotid space.  Associated small but asymmetrically enlarged and conspicuous right level 3 lymph nodes. No primary pharyngeal or laryngeal tumor identified."  The patient subsequently had incisional biopsy of the right upper neck lymph node on 12/19/2015 which is consistent with a low-grade follicular lymphoma. Patient has been referred for  evaluation and management for follicular lymphoma. She notes no other areas of obviously enlarged lymph nodes. No constitutional symptoms such as fevers, chills, night sweats, unexpected weight loss. Overall feels well and no differently than she felt 6 months ago.  INTERVAL HISTORY  [name] is here for her scheduled followup for follicular lymphoma.  The patient's last visit with [place] was on 10/16/2018. The pt reports that she is doing well overall.  The pt reports that she has been feeling good and staying safe in the midst of the pandemic. She and her husband are waiting until they get the vaccine to see their family. Pt has continued to eat well and has had no problems with her energy levels. Pt is no longer taking Asprin or Forteo. She has been placed on Prolia and continues to take her Cholecalciferol daily.   Of note since the patient's last visit, pt has had CT C/A/P([phone]) completed on 04/02/2019 with results revealing "Postoperative changes of right middle lobectomy as before. No signs of new adenopathy with stable perienteric and pericolonic soft tissue. Stable lower retroperitoneal soft tissue along the common iliac vasculature. Stable mild pancreatic ductal distension, attention on follow-up Aortic Atherosclerosis (ICD10-I70.0)."  Lab results (04/02/19) of CBC w/diff and CMP is as follows: all values are WNL except for HCT at 46.1, GFR Est Non Af Am at 56. 04/02/2019 LDH at 198  On review of systems, pt denies fatigue, skin changes/rashes, fevers, chills, night sweats, unexpected weight loss, bowel movement changes, abdominal pain and any other symptoms.   MEDICAL HISTORY:  Past Medical History:  Diagnosis Date  . Cancer ([name])  follicular lymphoma  . Cataract   . History of colon polyps    all colonic mucosa  . Hyperlipidemia    on lipitor  . Osteoporosis   Lung carcinoid 05/2006 s/p surgery Colonoscopy recently - 1 polyp Osteoporosis - on forteo, Prolia (2  years)  SURGICAL HISTORY: Past Surgical History:  Procedure Laterality Date  . COLONOSCOPY     [id number]  . EYE SURGERY Bilateral    cataract removal  . 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 POLYPECTOMY     2001,2006-all colonic mucosa   . SUBMANDIBULAR GLAND EXCISION Right 12/19/2015   Procedure: BIOPSY OF RIGHT SUBMANDIBULAR MASS;  Surgeon: Jerrell Belfast, MD;  Location: [facility] OR;  Service: ENT;  Laterality: Right;    SOCIAL HISTORY: Social History   Socioeconomic History  . Marital status: Married    Spouse name: Not on file  . Number of children: Not on file  . Years of education: Not on file  . Highest education level: Not on file  Occupational History  . Not on file  Tobacco Use  . Smoking status: Never Smoker  . Smokeless tobacco: Never Used  Substance and Sexual Activity  . Alcohol use: No    Alcohol/week: 0.0 standard drinks  . Drug use: No  . Sexual activity: Not on file  Other Topics Concern  . Not on file  Social History Narrative  . Not on file   Social Determinants of Health   Financial Resource Strain:   . Difficulty of Paying Living Expenses: Not on file  Food Insecurity:   . Worried About Charity fundraiser in the Last Year: Not on file  . Ran Out of Food in the Last Year: Not on file  Transportation Needs:   . Lack of Transportation (Medical): Not on file  . Lack of Transportation (Non-Medical): Not on file  Physical Activity:   . Days of Exercise per Week: Not on file  . Minutes of Exercise per Session: Not on file  Stress:   . Feeling of Stress : Not on file  Social Connections:   . Frequency of Communication with Friends and Family: Not on file  . Frequency of Social Gatherings with Friends and Family: Not on file  . Attends Religious Services: Not on file  . Active Member of Clubs or Organizations: Not on file  . Attends Archivist Meetings: Not on file  . Marital Status: Not on file  Intimate Partner  Violence:   . Fear of Current or Ex-Partner: Not on file  . Emotionally Abused: Not on file  . Physically Abused: Not on file  . Sexually Abused: Not on file    FAMILY HISTORY: Family History  Problem Relation Age of Onset  . Colon cancer Neg Hx   . Colon polyps Neg Hx   . Esophageal cancer Neg Hx   . Rectal cancer Neg Hx   . Stomach cancer Neg Hx     ALLERGIES:  is allergic to no known allergies.  MEDICATIONS:  Current Outpatient Medications  Medication Sig Dispense Refill  . aspirin 81 MG tablet Chew 81 mg by mouth daily.    [name] atorvastatin (LIPITOR) 20 MG tablet Take 20 mg by mouth.    . BD PEN NEEDLE NANO U/F 32G X 4 MM MISC every other day.     . Cholecalciferol (VITAMIN D) 2000 units CAPS Take 2,000 Units by mouth daily.    [name] FORTEO 600 MCG/2.4ML SOLN  Inject 20 mcg into the skin daily.    . Multiple Vitamins-Minerals (MULTIVITAMIN WITH MINERALS) tablet Take 1 tablet by mouth.    . Omega-3 Fatty Acids (FISH OIL TRIPLE STRENGTH) 1400 MG CAPS Take 1 capsule by mouth daily.     No current facility-administered medications for this visit.    REVIEW OF SYSTEMS: A 10+ POINT REVIEW OF SYSTEMS WAS OBTAINED including neurology, dermatology, psychiatry, cardiac, respiratory, lymph, extremities, GI, GU, Musculoskeletal, constitutional, breasts, reproductive, HEENT.  All pertinent positives are noted in the HPI.  All others are negative.   PHYSICAL EXAMINATION:  ECOG PERFORMANCE STATUS: 1 - Symptomatic but completely ambulatory  . Vitals:   04/04/19 1008  BP: (!) 170/65  Pulse: 81  Resp: 18  Temp: 98.2 F (36.8 C)  SpO2: 96%   Filed Weights   04/04/19 1008  Weight: 117 lb 12.8 oz (53.4 kg)   .Body mass index is 22.26 kg/m.   GENERAL:alert, in no acute distress and comfortable SKIN: no acute rashes, no significant lesions EYES: conjunctiva are pink and non-injected, sclera anicteric OROPHARYNX: MMM, no exudates, no oropharyngeal erythema or ulceration NECK: supple,  no JVD LYMPH:  no palpable lymphadenopathy in the cervical, axillary or inguinal regions LUNGS: clear to auscultation b/l with normal respiratory effort HEART: regular rate & rhythm ABDOMEN:  normoactive bowel sounds , non tender, not distended. No palpable hepatosplenomegaly.  Extremity: no pedal edema PSYCH: alert & oriented x 3 with fluent speech NEURO: no focal motor/sensory deficits  LABORATORY DATA:  I have reviewed the data as listed  CBC Latest Ref Rng & Units 04/02/2019 10/16/2018 02/21/2018  WBC 4.0 - 10.5 K/uL 8.0 9.1 8.3  Hemoglobin 12.0 - 15.0 g/dL 15.0 14.5 14.4  Hematocrit 36.0 - 46.0 % 46.1(H) 44.9 45.3  Platelets 150 - 400 K/uL 230 245 219  HGB 13.6  . CMP Latest Ref Rng & Units 04/02/2019 10/16/2018 02/21/2018  Glucose 70 - 99 mg/dL 90 89 86  BUN 8 - 23 mg/dL '17 18 14  ' Creatinine 0.44 - 1.00 mg/dL 0.94 1.02(H) 0.83  Sodium 135 - 145 mmol/L 143 142 142  Potassium 3.5 - 5.1 mmol/L 4.1 4.3 4.2  Chloride 98 - 111 mmol/L 106 107 106  CO2 22 - 32 mmol/L '28 25 27  ' Calcium 8.9 - 10.3 mg/dL 9.4 9.2 9.7  Total Protein 6.5 - 8.1 g/dL 7.2 7.1 7.2  Total Bilirubin 0.3 - 1.2 mg/dL 0.5 0.4 0.4  Alkaline Phos 38 - 126 U/L 55 74 65  AST 15 - 41 U/L '22 23 25  ' ALT 0 - 44 U/L '20 21 23   ' . Lab Results  Component Value Date   LDH 198 (H) 04/02/2019         RADIOGRAPHIC STUDIES: I have personally reviewed the radiological images as listed and agreed with the findings in the report. CT Chest W Contrast  Result Date: 04/02/2019 CLINICAL DATA:  Follicular lymphoma EXAM: CT CHEST, ABDOMEN, AND PELVIS WITH CONTRAST TECHNIQUE: Multidetector CT imaging of the chest, abdomen and pelvis was performed following the standard protocol during bolus administration of intravenous contrast. CONTRAST:  181m OMNIPAQUE IOHEXOL 300 MG/ML  SOLN COMPARISON:  02/21/2018 FINDINGS: CT CHEST FINDINGS Cardiovascular: Aorta is of normal caliber with scattered calcific and noncalcific atherosclerotic  changes throughout. Heart size is normal without pericardial effusion. Central pulmonary vasculature is normal. Mediastinum/Nodes: No signs of adenopathy in the chest. Thoracic inlet structures are normal. Small hiatal hernia, soften gets is normal. Lungs/Pleura: Signs of partial lung  resection as before in the right chest with surgical clips at the right hilum, findings are unchanged. No signs of suspicious mass or nodule. No signs of consolidation or evidence of pleural effusion. Musculoskeletal: No signs of chest wall lesion. CT ABDOMEN PELVIS FINDINGS Hepatobiliary: No signs of focal, suspicious hepatic lesion. Gallbladder and biliary tree is unremarkable. Pancreas: Pancreas is normal. No signs of focal lesion, mild pancreatic ductal distension is unchanged. Spleen: Spleen is unremarkable, normal size unchanged from prior exam. Adrenals/Urinary Tract: Normal adrenal glands. Symmetric enhancement of the bilateral kidneys. Small cyst in the interpolar right kidney is unremarkable. Urinary bladder is under distended otherwise unremarkable. Stomach/Bowel: Small hiatal hernia. No signs of bowel obstruction or acute bowel process. Some stranding in the jejunal mesentery with soft tissue along mesenteric reflections, largest area measuring 6 x 2.7 cm unchanged accounting for differences in mesenteric position compared to the prior study. No sign of acute gastrointestinal process. Soft tissue in the right lower quadrant adjacent to the right hemicolon measuring 3.9 x 1.8 cm also unchanged accounting for differences in colonic position as compared to the previous exam. Vascular/Lymphatic: Calcific and noncalcific atherosclerotic change throughout the abdominal aorta no signs of aneurysm. No discrete adenopathy in the retroperitoneum. Small lymph nodes anterior to the inferior vena cava and in the upper abdomen are stable. Soft tissue in the upper pelvis anterior to the left common iliac vein adjacent to the right common  iliac artery measuring approximately 9 mm in greatest thickness, previously 8-9 mm. Reproductive: Uterus remains in situ no adnexal mass. Other: No abdominal wall hernia or abnormality. No abdominopelvic ascites. Musculoskeletal: No signs of acute bone finding or evidence of destructive bone process. Rotary dextroconvex scoliotic curvature centered about the lower thoracic spine is similar to the prior study. IMPRESSION: Postoperative changes of right middle lobectomy as before. No signs of new adenopathy with stable perienteric and pericolonic soft tissue. Stable lower retroperitoneal soft tissue along the common iliac vasculature. Stable mild pancreatic ductal distension, attention on follow-up Aortic Atherosclerosis (ICD10-I70.0). Electronically Signed   By: Zetta Bills M.D.   On: 04/02/2019 14:23   CT Abdomen Pelvis W Contrast  Result Date: 04/02/2019 CLINICAL DATA:  Follicular lymphoma EXAM: CT CHEST, ABDOMEN, AND PELVIS WITH CONTRAST TECHNIQUE: Multidetector CT imaging of the chest, abdomen and pelvis was performed following the standard protocol during bolus administration of intravenous contrast. CONTRAST:  135m OMNIPAQUE IOHEXOL 300 MG/ML  SOLN COMPARISON:  02/21/2018 FINDINGS: CT CHEST FINDINGS Cardiovascular: Aorta is of normal caliber with scattered calcific and noncalcific atherosclerotic changes throughout. Heart size is normal without pericardial effusion. Central pulmonary vasculature is normal. Mediastinum/Nodes: No signs of adenopathy in the chest. Thoracic inlet structures are normal. Small hiatal hernia, soften gets is normal. Lungs/Pleura: Signs of partial lung resection as before in the right chest with surgical clips at the right hilum, findings are unchanged. No signs of suspicious mass or nodule. No signs of consolidation or evidence of pleural effusion. Musculoskeletal: No signs of chest wall lesion. CT ABDOMEN PELVIS FINDINGS Hepatobiliary: No signs of focal, suspicious hepatic  lesion. Gallbladder and biliary tree is unremarkable. Pancreas: Pancreas is normal. No signs of focal lesion, mild pancreatic ductal distension is unchanged. Spleen: Spleen is unremarkable, normal size unchanged from prior exam. Adrenals/Urinary Tract: Normal adrenal glands. Symmetric enhancement of the bilateral kidneys. Small cyst in the interpolar right kidney is unremarkable. Urinary bladder is under distended otherwise unremarkable. Stomach/Bowel: Small hiatal hernia. No signs of bowel obstruction or acute bowel process. Some stranding  in the jejunal mesentery with soft tissue along mesenteric reflections, largest area measuring 6 x 2.7 cm unchanged accounting for differences in mesenteric position compared to the prior study. No sign of acute gastrointestinal process. Soft tissue in the right lower quadrant adjacent to the right hemicolon measuring 3.9 x 1.8 cm also unchanged accounting for differences in colonic position as compared to the previous exam. Vascular/Lymphatic: Calcific and noncalcific atherosclerotic change throughout the abdominal aorta no signs of aneurysm. No discrete adenopathy in the retroperitoneum. Small lymph nodes anterior to the inferior vena cava and in the upper abdomen are stable. Soft tissue in the upper pelvis anterior to the left common iliac vein adjacent to the right common iliac artery measuring approximately 9 mm in greatest thickness, previously 8-9 mm. Reproductive: Uterus remains in situ no adnexal mass. Other: No abdominal wall hernia or abnormality. No abdominopelvic ascites. Musculoskeletal: No signs of acute bone finding or evidence of destructive bone process. Rotary dextroconvex scoliotic curvature centered about the lower thoracic spine is similar to the prior study. IMPRESSION: Postoperative changes of right middle lobectomy as before. No signs of new adenopathy with stable perienteric and pericolonic soft tissue. Stable lower retroperitoneal soft tissue along the  common iliac vasculature. Stable mild pancreatic ductal distension, attention on follow-up Aortic Atherosclerosis (ICD10-I70.0). Electronically Signed   By: Zetta Bills M.D.   On: 04/02/2019 14:23    ASSESSMENT & PLAN:   84 y.o. 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 Patient imaging shows that this is at least a stage III A with mesenteric involvement as well. No overt evidence of bone marrow involvement but bone marrow biopsy is not planned for additional staging based on patient preference and the fact that it will not change treatment at this time . LDH level within normal limits FLIPI score - intermediate risk with 5 year OS 78% and ten-year overall survival 51% (from data in the pre-rituxan era) S/p ISRT to cervical LN  Disease. 07/08/17 PET/CT showed slightly increasing activity levels in the mesentery. 02/21/18 CT C/A/P revealed Areas of abnormal, ill-defined soft tissue in the mesentery and along the peritoneal surface identified on previous PET-CT are similar today. No new or progressive interval findings. 2.  Aortic Atherosclerois.   PLAN: -Discussed pt labwork, 04/02/19; blood counts and chemistries are stable  -Discussed 04/02/2019 LDH is WNL at 198 -Discussed 04/02/2019 CT C/A/P([phone])"Postoperative changes of right middle lobectomy as before. No signs of new adenopathy with stable perienteric and pericolonic soft tissue. Stable lower retroperitoneal soft tissue along the common iliac vasculature. Stable mild pancreatic ductal distension, attention on follow-up Aortic Atherosclerosis (ICD10-I70.0)." -Advised pt that there she has no contraindications to the Covid-19 vaccine from a lymphoma standpoint, would be good to get if offered. -Continue 2000IU Vitamin D replacement for goal of 60 -The pt shows no clinical or lab progression of her follicular lymphoma at this time.    -No indication for further treatment at this time. -Will see the pt back in 6 months with labs -Pt to advised to contact if any new concerns or changes in symptomology   #2 history of lung carcinoid status post surgery in 2008. No concern with recurrence at this time.  #3 Hypercalcemia -likely from Forteo. Less likely from lymphoma. LDH WNL . No other clinical or lab findings suggestive of lymphoma progression at this time. Previously had normalized briefly when she was off the forteo but was placed  back on this. Pt stopped Forteo and her calcium has since normalized.    FOLLOW UP: RTC with Dr Irene Limbo with labs in 6 months   The total time spent in the appt was 20 minutes and more than 50% was on counseling and direct patient cares.  All of the patient's questions were answered with apparent satisfaction. The patient knows to call the clinic with any problems, questions or concerns.   Sullivan Lone MD [place] AAHIVMS [facility] Hematology/Oncology Physician [facility]  (Office):       [phone] (Work cell):  [phone] (Fax):           [phone]  I, Yevette Edwards, am acting as a scribe for Dr. Sullivan Lone.   .I have reviewed the above documentation for accuracy and completeness, and I agree with the above. Brunetta Genera MD

## 2019-04-04 ENCOUNTER — Other Ambulatory Visit: Payer: Self-pay

## 2019-04-04 ENCOUNTER — Telehealth: Payer: Self-pay | Admitting: Hematology

## 2019-04-04 ENCOUNTER — Inpatient Hospital Stay (HOSPITAL_BASED_OUTPATIENT_CLINIC_OR_DEPARTMENT_OTHER): Payer: Medicare Other | Admitting: Hematology

## 2019-04-04 VITALS — BP 170/65 | HR 81 | Temp 98.2°F | Resp 18 | Ht 61.0 in | Wt 117.8 lb

## 2019-04-04 DIAGNOSIS — C8201 Follicular lymphoma grade I, lymph nodes of head, face, and neck: Secondary | ICD-10-CM | POA: Diagnosis not present

## 2019-04-04 NOTE — Telephone Encounter (Signed)
Scheduled appt per 12/16 los.  Printed calendar and avs.

## 2019-04-16 ENCOUNTER — Ambulatory Visit: Payer: Medicare Other | Admitting: Hematology

## 2019-04-17 ENCOUNTER — Ambulatory Visit: Payer: Medicare Other | Admitting: Hematology

## 2019-05-02 DIAGNOSIS — Z23 Encounter for immunization: Secondary | ICD-10-CM | POA: Diagnosis not present

## 2019-05-10 DIAGNOSIS — M810 Age-related osteoporosis without current pathological fracture: Secondary | ICD-10-CM | POA: Diagnosis not present

## 2019-05-30 DIAGNOSIS — Z23 Encounter for immunization: Secondary | ICD-10-CM | POA: Diagnosis not present

## 2019-06-20 DIAGNOSIS — L570 Actinic keratosis: Secondary | ICD-10-CM | POA: Diagnosis not present

## 2019-06-20 DIAGNOSIS — Z1231 Encounter for screening mammogram for malignant neoplasm of breast: Secondary | ICD-10-CM | POA: Diagnosis not present

## 2019-06-20 DIAGNOSIS — Z85828 Personal history of other malignant neoplasm of skin: Secondary | ICD-10-CM | POA: Diagnosis not present

## 2019-06-20 DIAGNOSIS — D692 Other nonthrombocytopenic purpura: Secondary | ICD-10-CM | POA: Diagnosis not present

## 2019-06-20 DIAGNOSIS — D485 Neoplasm of uncertain behavior of skin: Secondary | ICD-10-CM | POA: Diagnosis not present

## 2019-06-20 DIAGNOSIS — L821 Other seborrheic keratosis: Secondary | ICD-10-CM | POA: Diagnosis not present

## 2019-06-20 DIAGNOSIS — D045 Carcinoma in situ of skin of trunk: Secondary | ICD-10-CM | POA: Diagnosis not present

## 2019-06-20 DIAGNOSIS — D1801 Hemangioma of skin and subcutaneous tissue: Secondary | ICD-10-CM | POA: Diagnosis not present

## 2019-08-21 DIAGNOSIS — C8209 Follicular lymphoma grade I, extranodal and solid organ sites: Secondary | ICD-10-CM | POA: Diagnosis not present

## 2019-08-21 DIAGNOSIS — C8203 Follicular lymphoma grade I, intra-abdominal lymph nodes: Secondary | ICD-10-CM | POA: Diagnosis not present

## 2019-08-21 DIAGNOSIS — M810 Age-related osteoporosis without current pathological fracture: Secondary | ICD-10-CM | POA: Diagnosis not present

## 2019-08-21 DIAGNOSIS — E7800 Pure hypercholesterolemia, unspecified: Secondary | ICD-10-CM | POA: Diagnosis not present

## 2019-10-03 ENCOUNTER — Inpatient Hospital Stay: Payer: Medicare Other | Attending: Hematology

## 2019-10-03 ENCOUNTER — Telehealth: Payer: Self-pay | Admitting: Hematology

## 2019-10-03 ENCOUNTER — Inpatient Hospital Stay (HOSPITAL_BASED_OUTPATIENT_CLINIC_OR_DEPARTMENT_OTHER): Payer: Medicare Other | Admitting: Hematology

## 2019-10-03 ENCOUNTER — Other Ambulatory Visit: Payer: Self-pay

## 2019-10-03 VITALS — BP 133/62 | HR 75 | Temp 97.5°F | Resp 18 | Ht 61.0 in | Wt 116.8 lb

## 2019-10-03 DIAGNOSIS — C8201 Follicular lymphoma grade I, lymph nodes of head, face, and neck: Secondary | ICD-10-CM

## 2019-10-03 DIAGNOSIS — E785 Hyperlipidemia, unspecified: Secondary | ICD-10-CM | POA: Diagnosis not present

## 2019-10-03 DIAGNOSIS — M810 Age-related osteoporosis without current pathological fracture: Secondary | ICD-10-CM | POA: Insufficient documentation

## 2019-10-03 DIAGNOSIS — Z79899 Other long term (current) drug therapy: Secondary | ICD-10-CM | POA: Diagnosis not present

## 2019-10-03 DIAGNOSIS — Z85110 Personal history of malignant carcinoid tumor of bronchus and lung: Secondary | ICD-10-CM | POA: Insufficient documentation

## 2019-10-03 LAB — CMP (CANCER CENTER ONLY)
ALT: 21 U/L (ref 0–44)
AST: 24 U/L (ref 15–41)
Albumin: 4.1 g/dL (ref 3.5–5.0)
Alkaline Phosphatase: 57 U/L (ref 38–126)
Anion gap: 8 (ref 5–15)
BUN: 20 mg/dL (ref 8–23)
CO2: 27 mmol/L (ref 22–32)
Calcium: 9.5 mg/dL (ref 8.9–10.3)
Chloride: 107 mmol/L (ref 98–111)
Creatinine: 0.96 mg/dL (ref 0.44–1.00)
GFR, Est AFR Am: >60 mL/min (ref >60)
GFR, Estimated: 55 mL/min — ABNORMAL LOW (ref >60)
Glucose, Bld: 87 mg/dL (ref 70–99)
Potassium: 4.8 mmol/L (ref 3.5–5.1)
Sodium: 142 mmol/L (ref 135–145)
Total Bilirubin: 0.6 mg/dL (ref 0.3–1.2)
Total Protein: 7 g/dL (ref 6.5–8.1)

## 2019-10-03 LAB — CBC WITH DIFFERENTIAL/PLATELET
Abs Immature Granulocytes: 0.02 10*3/uL (ref 0.00–0.07)
Basophils Absolute: 0.1 10*3/uL (ref 0.0–0.1)
Basophils Relative: 1 %
Eosinophils Absolute: 0.1 10*3/uL (ref 0.0–0.5)
Eosinophils Relative: 2 %
HCT: 45.3 % (ref 36.0–46.0)
Hemoglobin: 14.9 g/dL (ref 12.0–15.0)
Immature Granulocytes: 0 %
Lymphocytes Relative: 26 %
Lymphs Abs: 1.9 10*3/uL (ref 0.7–4.0)
MCH: 30.2 pg (ref 26.0–34.0)
MCHC: 32.9 g/dL (ref 30.0–36.0)
MCV: 91.7 fL (ref 80.0–100.0)
Monocytes Absolute: 0.6 10*3/uL (ref 0.1–1.0)
Monocytes Relative: 9 %
Neutro Abs: 4.4 10*3/uL (ref 1.7–7.7)
Neutrophils Relative %: 62 %
Platelets: 236 10*3/uL (ref 150–400)
RBC: 4.94 MIL/uL (ref 3.87–5.11)
RDW: 14.3 % (ref 11.5–15.5)
WBC: 7.2 10*3/uL (ref 4.0–10.5)
nRBC: 0 % (ref 0.0–0.2)

## 2019-10-03 LAB — LACTATE DEHYDROGENASE: LDH: 205 U/L — ABNORMAL HIGH (ref 98–192)

## 2019-10-03 NOTE — Progress Notes (Signed)
[name]    HEMATOLOGY/ONCOLOGY CLINIC NOTE  Date of Service: 10/03/19    Patient Care Team: Deland Pretty, MD as PCP - General (Internal Medicine)  CHIEF COMPLAINTS/PURPOSE OF CONSULTATION:   F/u for continued management of follicular lymphoma  HISTORY OF PRESENTING ILLNESS:   [name] [name] is a wonderful 85 y.o. female who has been referred to [place] by Dr .Deland Pretty, MD for evaluation and management of newly diagnosed follicular lymphoma.  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 Laverta Baltimore who is a retired Publishing rights manager from [place], [place].  She presented to her primary care physician with 3 months of asymptomatic fullness/swelling in her right upper neck with no overt symptoms of dental pain or swelling, fevers, chills or night sweats.  The swelling was painless and gradually growing.  She was referred to Dr. Jerrell Belfast (ENT) for further evaluation and had a CT of the neck on 12/08/2015 which showed findings "soft tissue mass measuring up to 3.2 cm which most resembles an abnormal right level 1 B lymph node. This medially displaces but seems to be separate from the adjacent right submandibular gland.There is a larger spiculated and somewhat infiltrative appearing right lateral neck mass centered at the level 2 nodal station measuring up to 4.7 cm. This lesion is inseparable from the undersurface of the right sternocleidomastoid muscle, but does not appear to invade the subjacent right carotid space.  Associated small but asymmetrically enlarged and conspicuous right level 3 lymph nodes. No primary pharyngeal or laryngeal tumor identified."  The patient subsequently had incisional biopsy of the right upper neck lymph node on 12/19/2015 which is consistent with a low-grade follicular lymphoma. Patient has been referred for  evaluation and management for follicular lymphoma. She notes no other areas of obviously enlarged lymph nodes. No constitutional symptoms such as fevers, chills, night sweats, unexpected weight loss. Overall feels well and no differently than she felt 6 months ago.  INTERVAL HISTORY [name] is here for her scheduled followup for follicular lymphoma. The patient's last visit with [place] was on 04/04/2019. The pt reports that she is doing well overall.  The pt reports that she has been gathering safely with those of her family and living community that are fully vaccinated. She has had no new symptoms.   Lab results today (10/03/19) of CBC w/diff and CMP is as follows: all values are WNL except for GFR Est Non Af Am at 55. 10/03/2019 LDH at 205  On review of systems, pt denies new lumps/bumps, fevers, chills, night sweats, fatigue, low appetite, SOB, abdominal pain, leg swelling, chest pain, bowel habit changes and any other symptoms.   MEDICAL HISTORY:  Past Medical History:  Diagnosis Date  . Cancer ([place])    follicular lymphoma  . Cataract   . History of colon polyps    all colonic mucosa  . Hyperlipidemia    on lipitor  . 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 COLONOSCOPY     2482,5003  . EYE SURGERY Bilateral    cataract removal  . 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 POLYPECTOMY     2001,2006-all colonic mucosa   .  SUBMANDIBULAR GLAND EXCISION Right 12/19/2015   Procedure: BIOPSY OF RIGHT SUBMANDIBULAR MASS;  Surgeon: Jerrell Belfast, MD;  Location: [facility] OR;  Service: ENT;  Laterality: Right;    SOCIAL HISTORY: Social History   Socioeconomic History  . Marital status: Married    Spouse name: Not on file  . Number of children: Not on file  . Years of education: Not on file  . Highest  education level: Not on file  Occupational History  . Not on file  Tobacco Use  . Smoking status: Never Smoker  . Smokeless tobacco: Never Used  Vaping Use  . Vaping Use: Never used  Substance and Sexual Activity  . Alcohol use: No    Alcohol/week: 0.0 standard drinks  . Drug use: No  . Sexual activity: Not on file  Other Topics Concern  . Not on file  Social History Narrative  . Not on file   Social Determinants of Health   Financial Resource Strain:   . Difficulty of Paying Living Expenses:   Food Insecurity:   . Worried About Charity fundraiser in the Last Year:   . Arboriculturist in the Last Year:   Transportation Needs:   . Film/video editor (Medical):   [name] Lack of Transportation (Non-Medical):   Physical Activity:   . Days of Exercise per Week:   . Minutes of Exercise per Session:   Stress:   . Feeling of Stress :   Social Connections:   . Frequency of Communication with Friends and Family:   . Frequency of Social Gatherings with Friends and Family:   . Attends Religious Services:   . Active Member of Clubs or Organizations:   . Attends Archivist Meetings:   [name] Marital Status:   Intimate Partner Violence:   . Fear of Current or Ex-Partner:   . Emotionally Abused:   [name] Physically Abused:   . Sexually Abused:     FAMILY HISTORY: Family History  Problem Relation Age of Onset  . Colon cancer Neg Hx   . Colon polyps Neg Hx   . Esophageal cancer Neg Hx   . Rectal cancer Neg Hx   . Stomach cancer Neg Hx     ALLERGIES:  is allergic to no known allergies.  MEDICATIONS:  Current Outpatient Medications  Medication Sig Dispense Refill  . atorvastatin (LIPITOR) 20 MG tablet Take 20 mg by mouth.    . BD PEN NEEDLE NANO U/F 32G X 4 MM MISC every other day.     . Cholecalciferol (VITAMIN D) 2000 units CAPS Take 2,000 Units by mouth daily.    [name] FORTEO 600 MCG/2.4ML SOLN Inject 20 mcg into the skin daily.    . Multiple Vitamins-Minerals  (MULTIVITAMIN WITH MINERALS) tablet Take 1 tablet by mouth.    . Omega-3 Fatty Acids (FISH OIL TRIPLE STRENGTH) 1400 MG CAPS Take 1 capsule by mouth daily.     No current facility-administered medications for this visit.    REVIEW OF SYSTEMS: A 10+ POINT REVIEW OF SYSTEMS WAS OBTAINED including neurology, dermatology, psychiatry, cardiac, respiratory, lymph, extremities, GI, GU, Musculoskeletal, constitutional, breasts, reproductive, HEENT.  All pertinent positives are noted in the HPI.  All others are negative.   PHYSICAL EXAMINATION:  ECOG PERFORMANCE STATUS: 1 - Symptomatic but completely ambulatory  . Vitals:   10/03/19 1058  BP: 133/62  Pulse: 75  Resp: 18  Temp: (!) 97.5 F (36.4 C)  SpO2: 98%   Filed Weights  10/03/19 1058  Weight: 116 lb 12.8 oz (53 kg)   .Body mass index is 22.07 kg/m.   GENERAL:alert, in no acute distress and comfortable SKIN: no acute rashes, no significant lesions EYES: conjunctiva are pink and non-injected, sclera anicteric OROPHARYNX: MMM, no exudates, no oropharyngeal erythema or ulceration NECK: supple, no JVD LYMPH:  no palpable lymphadenopathy in the cervical, axillary or inguinal regions LUNGS: clear to auscultation b/l with normal respiratory effort HEART: regular rate & rhythm ABDOMEN:  normoactive bowel sounds , non tender, not distended. No palpable hepatosplenomegaly.  Extremity: trace pedal edema PSYCH: alert & oriented x 3 with fluent speech NEURO: no focal motor/sensory deficits  LABORATORY DATA:  I have reviewed the data as listed  CBC Latest Ref Rng & Units 10/03/2019 04/02/2019 10/16/2018  WBC 4.0 - 10.5 K/uL 7.2 8.0 9.1  Hemoglobin 12.0 - 15.0 g/dL 14.9 15.0 14.5  Hematocrit 36 - 46 % 45.3 46.1(H) 44.9  Platelets 150 - 400 K/uL 236 230 245  HGB 13.6  . CMP Latest Ref Rng & Units 10/03/2019 04/02/2019 10/16/2018  Glucose 70 - 99 mg/dL 87 90 89  BUN 8 - 23 mg/dL '20 17 18  ' Creatinine 0.44 - 1.00 mg/dL 0.96 0.94 1.02(H)   Sodium 135 - 145 mmol/L 142 143 142  Potassium 3.5 - 5.1 mmol/L 4.8 4.1 4.3  Chloride 98 - 111 mmol/L 107 106 107  CO2 22 - 32 mmol/L '27 28 25  ' Calcium 8.9 - 10.3 mg/dL 9.5 9.4 9.2  Total Protein 6.5 - 8.1 g/dL 7.0 7.2 7.1  Total Bilirubin 0.3 - 1.2 mg/dL 0.6 0.5 0.4  Alkaline Phos 38 - 126 U/L 57 55 74  AST 15 - 41 U/L '24 22 23  ' ALT 0 - 44 U/L '21 20 21   ' . Lab Results  Component Value Date   LDH 205 (H) 10/03/2019         RADIOGRAPHIC STUDIES: I have personally reviewed the radiological images as listed and agreed with the findings in the report. No results found.  ASSESSMENT & PLAN:   85 y.o. 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 Patient imaging shows that this is at least a stage III A with mesenteric involvement as well. No overt evidence of bone marrow involvement but bone marrow biopsy is not planned for additional staging based on patient preference and the fact that it will not change treatment at this time . LDH level within normal limits FLIPI score - intermediate risk with 5 year OS 78% and ten-year overall survival 51% (from data in the pre-rituxan era) S/p ISRT to cervical LN  Disease. 07/08/17 PET/CT showed slightly increasing activity levels in the mesentery. 02/21/18 CT C/A/P revealed Areas of abnormal, ill-defined soft tissue in the mesentery and along the peritoneal surface identified on previous PET-CT are similar today. No new or progressive interval findings. 2.  Aortic Atherosclerois.  04/02/2019 CT C/A/P([phone]) revealed "Postoperative changes of right middle lobectomy as before. No signs of new adenopathy with stable perienteric and pericolonic soft tissue. Stable lower retroperitoneal soft tissue along the common iliac vasculature. Stable mild pancreatic ductal distension, attention on follow-up Aortic Atherosclerosis  (ICD10-I70.0)."  PLAN: -Discussed pt labwork today, 10/03/19; blood counts and chemistries look good, LDH is borderline high  -No constitutional symptoms or clinical lymphadenopathy/splenomegaly -No lab or clinical evidence of pt's FL recurrence/progression at this time -No indication for further treatment at this time. -Continue  2000IU Vitamin D replacement for goal of 60 -Will see back in 6 months with labs   #2 history of lung carcinoid status post surgery in 2008. No concern with recurrence at this time.  #3 Hypercalcemia -likely from Forteo. Less likely from lymphoma. LDH WNL . No other clinical or lab findings suggestive of lymphoma progression at this time. Previously had normalized briefly when she was off the forteo but was placed back on this. Pt stopped Forteo and her calcium has since normalized.    FOLLOW UP: RTC with Dr. Irene Limbo in 6 months with labs   The total time spent in the appt was 20 minutes and more than 50% was on counseling and direct patient cares.  All of the patient's questions were answered with apparent satisfaction. The patient knows to call the clinic with any problems, questions or concerns.   Sullivan Lone MD [place] AAHIVMS [facility] Hematology/Oncology Physician [facility]  (Office):       [phone] (Work cell):  [phone] (Fax):           [phone]  I, Yevette Edwards, am acting as a scribe for Dr. Sullivan Lone.   .I have reviewed the above documentation for accuracy and completeness, and I agree with the above. Brunetta Genera MD

## 2019-10-03 NOTE — Telephone Encounter (Signed)
Scheduled per 6/16 los. Printed avs and calendar for pt.

## 2019-11-12 DIAGNOSIS — M810 Age-related osteoporosis without current pathological fracture: Secondary | ICD-10-CM | POA: Diagnosis not present

## 2019-12-18 DIAGNOSIS — M810 Age-related osteoporosis without current pathological fracture: Secondary | ICD-10-CM | POA: Diagnosis not present

## 2019-12-18 DIAGNOSIS — E041 Nontoxic single thyroid nodule: Secondary | ICD-10-CM | POA: Diagnosis not present

## 2019-12-18 DIAGNOSIS — M8580 Other specified disorders of bone density and structure, unspecified site: Secondary | ICD-10-CM | POA: Diagnosis not present

## 2019-12-18 DIAGNOSIS — E7800 Pure hypercholesterolemia, unspecified: Secondary | ICD-10-CM | POA: Diagnosis not present

## 2019-12-18 DIAGNOSIS — Z0000 Encounter for general adult medical examination without abnormal findings: Secondary | ICD-10-CM | POA: Diagnosis not present

## 2019-12-21 DIAGNOSIS — Z0000 Encounter for general adult medical examination without abnormal findings: Secondary | ICD-10-CM | POA: Diagnosis not present

## 2019-12-21 DIAGNOSIS — M810 Age-related osteoporosis without current pathological fracture: Secondary | ICD-10-CM | POA: Diagnosis not present

## 2019-12-21 DIAGNOSIS — Z8639 Personal history of other endocrine, nutritional and metabolic disease: Secondary | ICD-10-CM | POA: Diagnosis not present

## 2019-12-21 DIAGNOSIS — E875 Hyperkalemia: Secondary | ICD-10-CM | POA: Diagnosis not present

## 2019-12-21 DIAGNOSIS — I700 Atherosclerosis of aorta: Secondary | ICD-10-CM | POA: Diagnosis not present

## 2019-12-21 DIAGNOSIS — C8209 Follicular lymphoma grade I, extranodal and solid organ sites: Secondary | ICD-10-CM | POA: Diagnosis not present

## 2019-12-21 DIAGNOSIS — E7800 Pure hypercholesterolemia, unspecified: Secondary | ICD-10-CM | POA: Diagnosis not present

## 2019-12-31 DIAGNOSIS — L821 Other seborrheic keratosis: Secondary | ICD-10-CM | POA: Diagnosis not present

## 2019-12-31 DIAGNOSIS — Z85828 Personal history of other malignant neoplasm of skin: Secondary | ICD-10-CM | POA: Diagnosis not present

## 2020-02-21 DIAGNOSIS — M8000XA Age-related osteoporosis with current pathological fracture, unspecified site, initial encounter for fracture: Secondary | ICD-10-CM | POA: Diagnosis not present

## 2020-02-21 DIAGNOSIS — M810 Age-related osteoporosis without current pathological fracture: Secondary | ICD-10-CM | POA: Diagnosis not present

## 2020-02-21 DIAGNOSIS — E875 Hyperkalemia: Secondary | ICD-10-CM | POA: Diagnosis not present

## 2020-03-20 DIAGNOSIS — E875 Hyperkalemia: Secondary | ICD-10-CM | POA: Diagnosis not present

## 2020-04-03 ENCOUNTER — Telehealth: Payer: Self-pay | Admitting: Hematology

## 2020-04-03 ENCOUNTER — Other Ambulatory Visit: Payer: Self-pay

## 2020-04-03 ENCOUNTER — Inpatient Hospital Stay: Payer: Medicare Other

## 2020-04-03 ENCOUNTER — Inpatient Hospital Stay: Payer: Medicare Other | Attending: Hematology | Admitting: Hematology

## 2020-04-03 VITALS — BP 152/65 | HR 73 | Temp 97.8°F | Resp 18 | Ht 61.0 in | Wt 114.4 lb

## 2020-04-03 DIAGNOSIS — E785 Hyperlipidemia, unspecified: Secondary | ICD-10-CM | POA: Diagnosis not present

## 2020-04-03 DIAGNOSIS — C8201 Follicular lymphoma grade I, lymph nodes of head, face, and neck: Secondary | ICD-10-CM

## 2020-04-03 DIAGNOSIS — I700 Atherosclerosis of aorta: Secondary | ICD-10-CM | POA: Diagnosis not present

## 2020-04-03 DIAGNOSIS — Z79899 Other long term (current) drug therapy: Secondary | ICD-10-CM | POA: Insufficient documentation

## 2020-04-03 DIAGNOSIS — M810 Age-related osteoporosis without current pathological fracture: Secondary | ICD-10-CM | POA: Diagnosis not present

## 2020-04-03 LAB — CMP (CANCER CENTER ONLY)
ALT: 23 U/L (ref 0–44)
AST: 23 U/L (ref 15–41)
Albumin: 4 g/dL (ref 3.5–5.0)
Alkaline Phosphatase: 61 U/L (ref 38–126)
Anion gap: 8 (ref 5–15)
BUN: 18 mg/dL (ref 8–23)
CO2: 28 mmol/L (ref 22–32)
Calcium: 10.2 mg/dL (ref 8.9–10.3)
Chloride: 107 mmol/L (ref 98–111)
Creatinine: 0.93 mg/dL (ref 0.44–1.00)
GFR, Estimated: >60 mL/min (ref >60)
Glucose, Bld: 72 mg/dL (ref 70–99)
Potassium: 4.7 mmol/L (ref 3.5–5.1)
Sodium: 143 mmol/L (ref 135–145)
Total Bilirubin: 0.5 mg/dL (ref 0.3–1.2)
Total Protein: 7 g/dL (ref 6.5–8.1)

## 2020-04-03 LAB — CBC WITH DIFFERENTIAL/PLATELET
Abs Immature Granulocytes: 0.02 10*3/uL (ref 0.00–0.07)
Basophils Absolute: 0.1 10*3/uL (ref 0.0–0.1)
Basophils Relative: 1 %
Eosinophils Absolute: 0.2 10*3/uL (ref 0.0–0.5)
Eosinophils Relative: 2 %
HCT: 45.8 % (ref 36.0–46.0)
Hemoglobin: 14.6 g/dL (ref 12.0–15.0)
Immature Granulocytes: 0 %
Lymphocytes Relative: 22 %
Lymphs Abs: 1.9 10*3/uL (ref 0.7–4.0)
MCH: 29.1 pg (ref 26.0–34.0)
MCHC: 31.9 g/dL (ref 30.0–36.0)
MCV: 91.4 fL (ref 80.0–100.0)
Monocytes Absolute: 0.7 10*3/uL (ref 0.1–1.0)
Monocytes Relative: 8 %
Neutro Abs: 5.8 10*3/uL (ref 1.7–7.7)
Neutrophils Relative %: 67 %
Platelets: 231 10*3/uL (ref 150–400)
RBC: 5.01 MIL/uL (ref 3.87–5.11)
RDW: 14.3 % (ref 11.5–15.5)
WBC: 8.7 10*3/uL (ref 4.0–10.5)
nRBC: 0 % (ref 0.0–0.2)

## 2020-04-03 LAB — LACTATE DEHYDROGENASE: LDH: 223 U/L — ABNORMAL HIGH (ref 98–192)

## 2020-04-03 NOTE — Progress Notes (Signed)
[name]    HEMATOLOGY/ONCOLOGY CLINIC NOTE  Date of Service: 04/03/20    Patient Care Team: Deland Pretty, MD as PCP - General (Internal Medicine)  CHIEF COMPLAINTS/PURPOSE OF CONSULTATION:   F/u for continued management of follicular lymphoma  HISTORY OF PRESENTING ILLNESS:   [name] [name] is a wonderful 85 y.o. female who has been referred to [place] by Dr .Deland Pretty, MD for evaluation and management of newly diagnosed follicular lymphoma.  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 Laverta Baltimore who is a retired Publishing rights manager from [place], [place].  She presented to her primary care physician with 3 months of asymptomatic fullness/swelling in her right upper neck with no overt symptoms of dental pain or swelling, fevers, chills or night sweats.  The swelling was painless and gradually growing.  She was referred to Dr. Jerrell Belfast (ENT) for further evaluation and had a CT of the neck on 12/08/2015 which showed findings "soft tissue mass measuring up to 3.2 cm which most resembles an abnormal right level 1 B lymph node. This medially displaces but seems to be separate from the adjacent right submandibular gland.There is a larger spiculated and somewhat infiltrative appearing right lateral neck mass centered at the level 2 nodal station measuring up to 4.7 cm. This lesion is inseparable from the undersurface of the right sternocleidomastoid muscle, but does not appear to invade the subjacent right carotid space.  Associated small but asymmetrically enlarged and conspicuous right level 3 lymph nodes. No primary pharyngeal or laryngeal tumor identified."  The patient subsequently had incisional biopsy of the right upper neck lymph node on 12/19/2015 which is consistent with a low-grade follicular lymphoma. Patient has been referred for  evaluation and management for follicular lymphoma. She notes no other areas of obviously enlarged lymph nodes. No constitutional symptoms such as fevers, chills, night sweats, unexpected weight loss. Overall feels well and no differently than she felt 6 months ago.  INTERVAL HISTORY: [name] [name] is here for her scheduled follow up for follicular lymphoma. The patient's last visit with [place] was on 10/03/2019. The pt reports that she is doing well overall.  The pt reports that she has felt well since our last visit and denies any new symptoms. Pt is up to date with her Pneumonia vaccines and has gotten her annual flu and COVID19 vaccines.   Lab results today (04/03/20) of CBC w/diff and CMP is as follows: all values are WNL. 04/03/2020 LDH at 223  On review of systems, pt denies new lumps/bumps, SOB, chest pain, fevers, chills, night sweats, leg swelling, abdominal pain, constipation, diarrhea and any other symptoms.    MEDICAL HISTORY:  Past Medical History:  Diagnosis Date  . Cancer ([place])    follicular lymphoma  . Cataract   . History of colon polyps    all colonic mucosa  . Hyperlipidemia    on lipitor  . 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 COLONOSCOPY     [id number]  . EYE SURGERY Bilateral    cataract removal  . 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 POLYPECTOMY     2001,2006-all colonic mucosa   . SUBMANDIBULAR GLAND EXCISION  Right 12/19/2015   Procedure: BIOPSY OF RIGHT SUBMANDIBULAR MASS;  Surgeon: Jerrell Belfast, MD;  Location: [facility] OR;  Service: ENT;  Laterality: Right;    SOCIAL HISTORY: Social History   Socioeconomic History  . Marital status: Married    Spouse name: Not on file  . Number of children: Not on file  . Years of education: Not on file  . Highest education level: Not on  file  Occupational History  . Not on file  Tobacco Use  . Smoking status: Never Smoker  . Smokeless tobacco: Never Used  Vaping Use  . Vaping Use: Never used  Substance and Sexual Activity  . Alcohol use: No    Alcohol/week: 0.0 standard drinks  . Drug use: No  . Sexual activity: Not on file  Other Topics Concern  . Not on file  Social History Narrative  . Not on file   Social Determinants of Health   Financial Resource Strain: Not on file  Food Insecurity: Not on file  Transportation Needs: Not on file  Physical Activity: Not on file  Stress: Not on file  Social Connections: Not on file  Intimate Partner Violence: Not on file    FAMILY HISTORY: Family History  Problem Relation Age of Onset  . Colon cancer Neg Hx   . Colon polyps Neg Hx   . Esophageal cancer Neg Hx   . Rectal cancer Neg Hx   . Stomach cancer Neg Hx     ALLERGIES:  is allergic to no known allergies.  MEDICATIONS:  Current Outpatient Medications  Medication Sig Dispense Refill  . atorvastatin (LIPITOR) 20 MG tablet Take 20 mg by mouth.    . BD PEN NEEDLE NANO U/F 32G X 4 MM MISC every other day.     . Cholecalciferol (VITAMIN D) 2000 units CAPS Take 2,000 Units by mouth daily.    [name] FORTEO 600 MCG/2.4ML SOLN Inject 20 mcg into the skin daily. (Patient not taking: Reported on 10/03/2019)    . Multiple Vitamins-Minerals (MULTIVITAMIN WITH MINERALS) tablet Take 1 tablet by mouth.    . Omega-3 Fatty Acids (FISH OIL TRIPLE STRENGTH) 1400 MG CAPS Take 1 capsule by mouth daily.     No current facility-administered medications for this visit.    REVIEW OF SYSTEMS: A 10+ POINT REVIEW OF SYSTEMS WAS OBTAINED including neurology, dermatology, psychiatry, cardiac, respiratory, lymph, extremities, GI, GU, Musculoskeletal, constitutional, breasts, reproductive, HEENT.  All pertinent positives are noted in the HPI.  All others are negative.   PHYSICAL EXAMINATION:  ECOG PERFORMANCE STATUS: 1 - Symptomatic but  completely ambulatory  . There were no vitals filed for this visit. There were no vitals filed for this visit. .There is no height or weight on file to calculate BMI.   GENERAL:alert, in no acute distress and comfortable SKIN: no acute rashes, no significant lesions EYES: conjunctiva are pink and non-injected, sclera anicteric OROPHARYNX: MMM, no exudates, no oropharyngeal erythema or ulceration NECK: supple, no JVD LYMPH:  no palpable lymphadenopathy in the cervical, axillary or inguinal regions LUNGS: clear to auscultation b/l with normal respiratory effort HEART: regular rate & rhythm ABDOMEN:  normoactive bowel sounds , non tender, not distended. No palpable hepatosplenomegaly.  Extremity: no pedal edema PSYCH: alert & oriented x 3 with fluent speech NEURO: no focal motor/sensory deficits  LABORATORY DATA:  I have reviewed the data as listed  CBC Latest Ref Rng & Units 10/03/2019 04/02/2019 10/16/2018  WBC 4.0 - 10.5 K/uL 7.2 8.0 9.1  Hemoglobin  12.0 - 15.0 g/dL 14.9 15.0 14.5  Hematocrit 36.0 - 46.0 % 45.3 46.1(H) 44.9  Platelets 150 - 400 K/uL 236 230 245  HGB 13.6  . CMP Latest Ref Rng & Units 10/03/2019 04/02/2019 10/16/2018  Glucose 70 - 99 mg/dL 87 90 89  BUN 8 - 23 mg/dL _0 Creatinine 0.44 - 1.00 mg/dL 0.96 0.94 1.02(H)  Sodium 135 - 145 mmol/L 142 143 142  Potassium 3.5 - 5.1 mmol/L 4.8 4.1 4.3  Chloride 98 - 111 mmol/L 107 106 107  CO2 22 - 32 mmol/L _1 Calcium 8.9 - 10.3 mg/dL 9.5 9.4 9.2  Total Protein 6.5 - 8.1 g/dL 7.0 7.2 7.1  Total Bilirubin 0.3 - 1.2 mg/dL 0.6 0.5 0.4  Alkaline Phos 38 - 126 U/L 57 55 74  AST 15 - 41 U/L _2 ALT 0 - 44 U/L _3 . Lab Results  Component Value Date   LDH 205 (H) 10/03/2019         RADIOGRAPHIC STUDIES: I have personally reviewed the radiological images as listed and agreed with the findings in the report. No results found.  ASSESSMENT & PLAN:   85 y.o. very pleasant Caucasian lady  with  #1 Stage IIIA low-grade follicular lymphoma ( grade 1-2/3) presenting with right upper neck lymphadenopathy. Patient imaging shows that this is at least a stage III A with mesenteric involvement as well. No overt evidence of bone marrow involvement but bone marrow biopsy is not planned for additional staging based on patient preference and the fact that it will not change treatment at this time . LDH level within normal limits FLIPI score - intermediate risk with 5 year OS 78% and ten-year overall survival 51% (from data in the pre-rituxan era) S/p ISRT to cervical LN  Disease. 07/08/17 PET/CT showed slightly increasing activity levels in the mesentery. 02/21/18 CT C/A/P revealed Areas of abnormal, ill-defined soft tissue in the mesentery and along the peritoneal surface identified on previous PET-CT are similar today. No new or progressive interval findings. 2.  Aortic Atherosclerois.  04/02/2019 CT C/A/[phone]) revealed "Postoperative changes of right middle lobectomy as before. No signs of new adenopathy with stable perienteric and pericolonic soft tissue. Stable lower retroperitoneal soft tissue along the common iliac vasculature. Stable mild pancreatic ductal distension, attention on follow-up Aortic Atherosclerosis (ICD10-I70.0)."  PLAN: -Discussed pt labwork today, 04/03/20; blood counts and chemistries are nml, LDH is mildly elevated.  -No lab or clinical evidence of overt Follicular Lymphoma progression at this time. Will continue watchful observation.  -Continue 2000 IU Vitamin D replacement for goal of 60 -Will see back in 6 months with labs    #2 history of lung carcinoid status post surgery in 2008. No concern with recurrence at this time.  #3 Hypercalcemia -likely from Forteo. Less likely from lymphoma. LDH WNL . No other clinical or lab findings suggestive of lymphoma progression at this time. Previously had normalized briefly when she was off the forteo but was placed  back on this. Pt stopped Forteo and her calcium has since normalized.    FOLLOW UP: RTC with Dr. Irene Limbo in 6 months with labs   The total time spent in the appt was 20 minutes and more than 50% was on counseling and direct patient cares.  All of the patient's questions were answered with apparent satisfaction. The patient knows to call the clinic with any problems, questions or concerns.   Sullivan Lone  MD MS AAHIVMS [facility] Hematology/Oncology Physician [place]  (Office):       [phone] (Work cell):  [phone] (Fax):           [phone]  I, Yevette Edwards, am acting as a scribe for Dr. Sullivan Lone.   .I have reviewed the above documentation for accuracy and completeness, and I agree with the above. Brunetta Genera MD

## 2020-04-03 NOTE — Telephone Encounter (Signed)
Scheduled appointments per 12/16 los. Spoke to patient who is aware of appointments date and times.

## 2020-05-16 DIAGNOSIS — M810 Age-related osteoporosis without current pathological fracture: Secondary | ICD-10-CM | POA: Diagnosis not present

## 2020-05-19 DIAGNOSIS — H353131 Nonexudative age-related macular degeneration, bilateral, early dry stage: Secondary | ICD-10-CM | POA: Diagnosis not present

## 2020-05-19 DIAGNOSIS — H524 Presbyopia: Secondary | ICD-10-CM | POA: Diagnosis not present

## 2020-05-19 DIAGNOSIS — Z961 Presence of intraocular lens: Secondary | ICD-10-CM | POA: Diagnosis not present

## 2020-05-19 DIAGNOSIS — H1789 Other corneal scars and opacities: Secondary | ICD-10-CM | POA: Diagnosis not present

## 2020-06-26 DIAGNOSIS — L821 Other seborrheic keratosis: Secondary | ICD-10-CM | POA: Diagnosis not present

## 2020-06-26 DIAGNOSIS — L812 Freckles: Secondary | ICD-10-CM | POA: Diagnosis not present

## 2020-06-26 DIAGNOSIS — L570 Actinic keratosis: Secondary | ICD-10-CM | POA: Diagnosis not present

## 2020-06-26 DIAGNOSIS — D1722 Benign lipomatous neoplasm of skin and subcutaneous tissue of left arm: Secondary | ICD-10-CM | POA: Diagnosis not present

## 2020-06-26 DIAGNOSIS — L84 Corns and callosities: Secondary | ICD-10-CM | POA: Diagnosis not present

## 2020-06-26 DIAGNOSIS — D1801 Hemangioma of skin and subcutaneous tissue: Secondary | ICD-10-CM | POA: Diagnosis not present

## 2020-06-26 DIAGNOSIS — Z85828 Personal history of other malignant neoplasm of skin: Secondary | ICD-10-CM | POA: Diagnosis not present

## 2020-08-14 ENCOUNTER — Telehealth: Payer: Self-pay | Admitting: Hematology

## 2020-08-14 NOTE — Telephone Encounter (Signed)
R/S per 12/16 los, Patient Aware.

## 2020-10-02 ENCOUNTER — Other Ambulatory Visit: Payer: Medicare Other

## 2020-10-02 ENCOUNTER — Ambulatory Visit: Payer: Medicare Other | Admitting: Hematology

## 2020-10-15 ENCOUNTER — Other Ambulatory Visit (HOSPITAL_BASED_OUTPATIENT_CLINIC_OR_DEPARTMENT_OTHER): Payer: Self-pay

## 2020-10-15 ENCOUNTER — Other Ambulatory Visit: Payer: Self-pay

## 2020-10-15 ENCOUNTER — Telehealth: Payer: Self-pay | Admitting: Hematology

## 2020-10-15 ENCOUNTER — Ambulatory Visit: Payer: Medicare Other | Attending: Internal Medicine

## 2020-10-15 DIAGNOSIS — Z23 Encounter for immunization: Secondary | ICD-10-CM

## 2020-10-15 MED ORDER — COVID-19 MRNA VACC (MODERNA) 100 MCG/0.5ML IM SUSP
INTRAMUSCULAR | 0 refills | Status: DC
  Filled 2020-10-15: qty 0.25, 1d supply, fill #0

## 2020-10-15 NOTE — Progress Notes (Signed)
   Covid-19 Vaccination Clinic  Name:  [name] [name]    MRN: [id number] DOB: [birth year]  10/15/2020  [name] was observed post Covid-19 immunization for 15 minutes without incident. She was provided with Vaccine Information Sheet and instruction to access the V-Safe system.   [name] was instructed to call 911 with any severe reactions post vaccine: Difficulty breathing  Swelling of face and throat  A fast heartbeat  A bad rash all over body  Dizziness and weakness   Immunizations Administered     Name Date Dose VIS Date Route   Moderna Covid-19 Booster Vaccine 10/15/2020 11:45 AM 0.25 mL 02/06/2020 Intramuscular   Manufacturer: Moderna   Lot: [id number]   Mariano Colon: [id number]

## 2020-10-15 NOTE — Telephone Encounter (Signed)
Left message with rescheduled upcoming appointment due to provider not in office.

## 2020-10-17 ENCOUNTER — Ambulatory Visit: Payer: Medicare Other | Admitting: Hematology

## 2020-10-17 ENCOUNTER — Other Ambulatory Visit: Payer: Medicare Other

## 2020-10-26 NOTE — Progress Notes (Signed)
[name]    HEMATOLOGY/ONCOLOGY CLINIC NOTE  Date of Service: 10/27/20    Patient Care Team: Deland Pretty, MD as PCP - General (Internal Medicine)  CHIEF COMPLAINTS/PURPOSE OF CONSULTATION:   F/u for continued management of follicular lymphoma  HISTORY OF PRESENTING ILLNESS:   [name] [name] is a wonderful 86 y.o. female who has been referred to [place] by Dr .Deland Pretty, MD for evaluation and management of newly diagnosed follicular lymphoma.  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 Laverta [place] who is a retired Publishing rights manager from [place], [place].  She presented to her primary care physician with 3 months of asymptomatic fullness/swelling in her right upper neck with no overt symptoms of dental pain or swelling, fevers, chills or night sweats.  The swelling was painless and gradually growing.  She was referred to Dr. Jerrell Belfast (ENT) for further evaluation and had a CT of the neck on 12/08/2015 which showed findings "soft tissue mass measuring up to 3.2 cm which most resembles an abnormal right level 1 B lymph node. This medially displaces but seems to be separate from the adjacent right submandibular gland.There is a larger spiculated and somewhat infiltrative appearing right lateral neck mass centered at the level 2 nodal station measuring up to 4.7 cm. This lesion is inseparable from the undersurface of the right sternocleidomastoid muscle, but does not appear to invade the subjacent right carotid space.  Associated small but asymmetrically enlarged and conspicuous right level 3 lymph nodes. No primary pharyngeal or laryngeal tumor identified."  The patient subsequently had incisional biopsy of the right upper neck lymph node on 12/19/2015 which is consistent with a low-grade follicular lymphoma. Patient has been referred for  evaluation and management for follicular lymphoma. She notes no other areas of obviously enlarged lymph nodes. No constitutional symptoms such as fevers, chills, night sweats, unexpected weight loss. Overall feels well and no differently than she felt 6 months ago.  INTERVAL HISTORY:  [name] is here for her scheduled follow up for follicular lymphoma. The patient's last visit with [place] was on 04/03/2020. The pt reports that she is doing well overall.  The pt reports no new symptoms or concerns over the last seven months. She notes that for two weeks in January she felt a warmness in her stomach, but this was just a sensation and has resolved ever since then.   Lab results today 10/27/2020 of CBC w/diff and CMP is as follows: all values are WNL. 10/27/2020 LDH of 192.  On review of systems, the pt denies sudden weight loss, leg swelling, decreased appetite, abdominal pain, abdominal distention, new lumps/bumps, and any other symptoms.   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   POLYPECTOMY     2001,2006-all colonic mucosa  SUBMANDIBULAR GLAND EXCISION Right 12/19/2015   Procedure: BIOPSY OF RIGHT SUBMANDIBULAR MASS;  Surgeon: David Shoemaker, MD;  Location: [facility] OR;  Service: ENT;  Laterality: Right;    SOCIAL HISTORY: Social History   Socioeconomic History   Marital status: Married    Spouse name: Not on file   Number of children: Not on file   Years of education: Not on file   Highest education  level: Not on file  Occupational History   Not on file  Tobacco Use   Smoking status: Not on file   Smokeless tobacco: Never  Vaping Use   Vaping Use: Never used  Substance and Sexual Activity   Alcohol use: No    Alcohol/week: 0.0 standard drinks   Drug use: No   Sexual activity: Not on file  Other Topics Concern   Not on file  Social History Narrative   ** Merged History Encounter **       Social Determinants of Health   Financial Resource Strain: Not on file  Food Insecurity: Not on file  Transportation Needs: Not on file  Physical Activity: Not on file  Stress: Not on file  Social Connections: Not on file  Intimate Partner Violence: Not on file    FAMILY HISTORY: Family History  Problem Relation Age of Onset   Colon cancer Neg Hx    Colon polyps Neg Hx    Esophageal cancer Neg Hx    Rectal cancer Neg Hx    Stomach cancer Neg Hx     ALLERGIES:  is allergic to no known allergies.  MEDICATIONS:  Current Outpatient Medications  Medication Sig Dispense Refill   atorvastatin (LIPITOR) 20 MG tablet Take 20 mg by mouth.     BD PEN NEEDLE NANO U/F 32G X 4 MM MISC every other day.      Cholecalciferol (VITAMIN D) 2000 units CAPS Take 2,000 Units by mouth daily.     COVID-19 mRNA vaccine, Moderna, 100 MCG/0.5ML injection Inject into the muscle. 0.25 mL 0   Multiple Vitamins-Minerals (MULTIVITAMIN WITH MINERALS) tablet Take 1 tablet by mouth.     Omega-3 Fatty Acids (FISH OIL TRIPLE STRENGTH) 1400 MG CAPS Take 1 capsule by mouth daily.     No current facility-administered medications for this visit.    REVIEW OF SYSTEMS: 10 Point review of Systems was done is negative except as noted above.  PHYSICAL EXAMINATION:  ECOG PERFORMANCE STATUS: 1 - Symptomatic but completely ambulatory  . Vitals:   10/27/20 1317  BP: (!) 142/53  Pulse: 69  Resp: 19  Temp: 98.7 F (37.1 C)  SpO2: 97%   Filed Weights   10/27/20 1317  Weight: 111 lb 9.6 oz (50.6 kg)   .Body  mass index is 21.09 kg/m.   NAD. GENERAL:alert, in no acute distress and comfortable SKIN: no acute rashes, no significant lesions EYES: conjunctiva are pink and non-injected, sclera anicteric OROPHARYNX: MMM, no exudates, no oropharyngeal erythema or ulceration NECK: supple, no JVD LYMPH:  no palpable lymphadenopathy in the cervical, axillary or inguinal regions LUNGS: clear to auscultation b/l with normal respiratory effort HEART: regular rate & rhythm ABDOMEN:  normoactive bowel sounds , non tender, not distended. No palpable hepatosplenomegaly.  Extremity: no pedal edema PSYCH: alert & oriented x 3 with fluent speech NEURO: no focal motor/sensory deficits  LABORATORY DATA:  I have reviewed the data as listed  CBC Latest Ref Rng & Units 10/27/2020 04/03/2020 10/03/2019  WBC 4.0 - 10.5 K/uL 7.8 8.7 7.2    Hemoglobin 12.0 - 15.0 g/dL 14.6 14.6 14.9  Hematocrit 36.0 - 46.0 % 43.7 45.8 45.3  Platelets 150 - 400 K/uL 213 231 236  HGB 13.6  . CMP Latest Ref Rng & Units 10/27/2020 04/03/2020 10/03/2019  Glucose 70 - 99 mg/dL 89 72 87  BUN 8 - 23 mg/dL 18 18 20  Creatinine 0.44 - 1.00 mg/dL 0.81 0.93 0.96  Sodium 135 - 145 mmol/L 142 143 142  Potassium 3.5 - 5.1 mmol/L 4.0 4.7 4.8  Chloride 98 - 111 mmol/L 107 107 107  CO2 22 - 32 mmol/L 27 28 27  Calcium 8.9 - 10.3 mg/dL 10.0 10.2 9.5  Total Protein 6.5 - 8.1 g/dL 6.8 7.0 7.0  Total Bilirubin 0.3 - 1.2 mg/dL 0.4 0.5 0.6  Alkaline Phos 38 - 126 U/L 61 61 57  AST 15 - 41 U/L 20 23 24  ALT 0 - 44 U/L 18 23 21   . Lab Results  Component Value Date   LDH 192 10/27/2020         RADIOGRAPHIC STUDIES: I have personally reviewed the radiological images as listed and agreed with the findings in the report. No results found.  ASSESSMENT & PLAN:   85 y.o. 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 Patient imaging shows that  this is at least a stage III A with mesenteric involvement as well. No overt evidence of bone marrow involvement but bone marrow biopsy is not planned for additional staging based on patient preference and the fact that it will not change treatment at this time . LDH level within normal limits FLIPI score - intermediate risk with 5 year OS 78% and ten-year overall survival 51% (from data in the pre-rituxan era) S/p ISRT to cervical LN  Disease. 07/08/17 PET/CT showed slightly increasing activity levels in the mesentery. 02/21/18 CT C/A/P revealed Areas of abnormal, ill-defined soft tissue in the mesentery and along the peritoneal surface identified on previous PET-CT are similar today. No new or progressive interval findings. 2.  Aortic Atherosclerois.  04/02/2019 CT C/A/P([id number]) revealed "Postoperative changes of right middle lobectomy as before. No signs of new adenopathy with stable perienteric and pericolonic soft tissue. Stable lower retroperitoneal soft tissue along the common iliac vasculature. Stable mild pancreatic ductal distension, attention on follow-up Aortic Atherosclerosis (ICD10-I70.0)."  PLAN: -Discussed pt labwork today, 10/27/2020; blood counts, chemistries, LDH all completely normal. -Advised pt our last scans was December 2020.We can repeat this December 2022, but we do not have to in absence of symptoms and with normal labwork. -No lab or clinical evidence of overt Follicular Lymphoma progression at this time. Will continue watchful observation.  -Continue 2000 IU Vitamin D replacement. -Will see back in 6 months with labs.   #2 history of lung carcinoid status post surgery in 2008. No concern with recurrence at this time.  #3 Hypercalcemia -likely from Forteo. Pt stopped Forteo and her calcium has since normalized.    FOLLOW UP: RTC with Dr Kale with labs in 6 months   The total time spent in the appt was 30 minutes and more than 50% was on counseling and direct  patient cares.  All of the patient's questions were answered with apparent satisfaction. The patient knows to call the clinic with any problems, questions or concerns.   Gautam Kale MD MS AAHIVMS [facility] Hematology/Oncology Physician [facility]  (Office):         [phone] (Work cell):  [phone] (Fax):           [phone]  I, Reinaldo Raddle, am acting as scribe for Dr. Sullivan Lone, MD.    .I have reviewed the above documentation for accuracy and completeness, and I agree with the above. Brunetta Genera MD

## 2020-10-27 ENCOUNTER — Inpatient Hospital Stay: Payer: Medicare Other | Attending: Hematology

## 2020-10-27 ENCOUNTER — Inpatient Hospital Stay (HOSPITAL_BASED_OUTPATIENT_CLINIC_OR_DEPARTMENT_OTHER): Payer: Medicare Other | Admitting: Hematology

## 2020-10-27 ENCOUNTER — Other Ambulatory Visit: Payer: Self-pay

## 2020-10-27 VITALS — BP 142/53 | HR 69 | Temp 98.7°F | Resp 19 | Wt 111.6 lb

## 2020-10-27 DIAGNOSIS — Z8572 Personal history of non-Hodgkin lymphomas: Secondary | ICD-10-CM | POA: Insufficient documentation

## 2020-10-27 DIAGNOSIS — C8201 Follicular lymphoma grade I, lymph nodes of head, face, and neck: Secondary | ICD-10-CM | POA: Diagnosis not present

## 2020-10-27 LAB — CBC WITH DIFFERENTIAL/PLATELET
Abs Immature Granulocytes: 0.03 10*3/uL (ref 0.00–0.07)
Basophils Absolute: 0.1 10*3/uL (ref 0.0–0.1)
Basophils Relative: 1 %
Eosinophils Absolute: 0.2 10*3/uL (ref 0.0–0.5)
Eosinophils Relative: 2 %
HCT: 43.7 % (ref 36.0–46.0)
Hemoglobin: 14.6 g/dL (ref 12.0–15.0)
Immature Granulocytes: 0 %
Lymphocytes Relative: 23 %
Lymphs Abs: 1.8 10*3/uL (ref 0.7–4.0)
MCH: 30 pg (ref 26.0–34.0)
MCHC: 33.4 g/dL (ref 30.0–36.0)
MCV: 89.9 fL (ref 80.0–100.0)
Monocytes Absolute: 0.7 10*3/uL (ref 0.1–1.0)
Monocytes Relative: 9 %
Neutro Abs: 5.1 10*3/uL (ref 1.7–7.7)
Neutrophils Relative %: 65 %
Platelets: 213 10*3/uL (ref 150–400)
RBC: 4.86 MIL/uL (ref 3.87–5.11)
RDW: 14.4 % (ref 11.5–15.5)
WBC: 7.8 10*3/uL (ref 4.0–10.5)
nRBC: 0 % (ref 0.0–0.2)

## 2020-10-27 LAB — CMP (CANCER CENTER ONLY)
ALT: 18 U/L (ref 0–44)
AST: 20 U/L (ref 15–41)
Albumin: 3.8 g/dL (ref 3.5–5.0)
Alkaline Phosphatase: 61 U/L (ref 38–126)
Anion gap: 8 (ref 5–15)
BUN: 18 mg/dL (ref 8–23)
CO2: 27 mmol/L (ref 22–32)
Calcium: 10 mg/dL (ref 8.9–10.3)
Chloride: 107 mmol/L (ref 98–111)
Creatinine: 0.81 mg/dL (ref 0.44–1.00)
GFR, Estimated: >60 mL/min (ref >60)
Glucose, Bld: 89 mg/dL (ref 70–99)
Potassium: 4 mmol/L (ref 3.5–5.1)
Sodium: 142 mmol/L (ref 135–145)
Total Bilirubin: 0.4 mg/dL (ref 0.3–1.2)
Total Protein: 6.8 g/dL (ref 6.5–8.1)

## 2020-10-27 LAB — LACTATE DEHYDROGENASE: LDH: 192 U/L (ref 98–192)

## 2020-10-29 ENCOUNTER — Telehealth: Payer: Self-pay | Admitting: Hematology

## 2020-10-29 NOTE — Telephone Encounter (Signed)
Scheduled follow-up appointment per 7/11 los. Patient is aware. 

## 2020-11-17 DIAGNOSIS — M810 Age-related osteoporosis without current pathological fracture: Secondary | ICD-10-CM | POA: Diagnosis not present

## 2020-12-23 DIAGNOSIS — M810 Age-related osteoporosis without current pathological fracture: Secondary | ICD-10-CM | POA: Diagnosis not present

## 2020-12-23 DIAGNOSIS — E7800 Pure hypercholesterolemia, unspecified: Secondary | ICD-10-CM | POA: Diagnosis not present

## 2020-12-25 ENCOUNTER — Other Ambulatory Visit (HOSPITAL_BASED_OUTPATIENT_CLINIC_OR_DEPARTMENT_OTHER): Payer: Self-pay | Admitting: Internal Medicine

## 2020-12-25 DIAGNOSIS — Z0000 Encounter for general adult medical examination without abnormal findings: Secondary | ICD-10-CM | POA: Diagnosis not present

## 2020-12-25 DIAGNOSIS — Z8639 Personal history of other endocrine, nutritional and metabolic disease: Secondary | ICD-10-CM | POA: Diagnosis not present

## 2020-12-25 DIAGNOSIS — Z23 Encounter for immunization: Secondary | ICD-10-CM | POA: Diagnosis not present

## 2020-12-25 DIAGNOSIS — I700 Atherosclerosis of aorta: Secondary | ICD-10-CM | POA: Diagnosis not present

## 2020-12-25 DIAGNOSIS — M810 Age-related osteoporosis without current pathological fracture: Secondary | ICD-10-CM | POA: Diagnosis not present

## 2020-12-25 DIAGNOSIS — C8209 Follicular lymphoma grade I, extranodal and solid organ sites: Secondary | ICD-10-CM | POA: Diagnosis not present

## 2020-12-25 DIAGNOSIS — Z1231 Encounter for screening mammogram for malignant neoplasm of breast: Secondary | ICD-10-CM

## 2020-12-25 DIAGNOSIS — E7800 Pure hypercholesterolemia, unspecified: Secondary | ICD-10-CM | POA: Diagnosis not present

## 2021-01-30 ENCOUNTER — Other Ambulatory Visit (HOSPITAL_BASED_OUTPATIENT_CLINIC_OR_DEPARTMENT_OTHER): Payer: Self-pay

## 2021-01-30 ENCOUNTER — Other Ambulatory Visit: Payer: Self-pay

## 2021-01-30 ENCOUNTER — Ambulatory Visit: Payer: Medicare Other | Attending: Internal Medicine

## 2021-01-30 DIAGNOSIS — Z23 Encounter for immunization: Secondary | ICD-10-CM

## 2021-01-30 MED ORDER — MODERNA COVID-19 BIVAL BOOSTER 50 MCG/0.5ML IM SUSP
INTRAMUSCULAR | 0 refills | Status: DC
  Filled 2021-01-30: qty 0.5, 1d supply, fill #0

## 2021-01-30 NOTE — Progress Notes (Signed)
   Covid-19 Vaccination Clinic  Name:  [name] [name]    MRN: [id number] DOB: 1934-10-10  01/30/2021  [name] [name] was observed post Covid-19 immunization for 15 minutes without incident. She was provided with Vaccine Information Sheet and instruction to access the V-Safe system.   [name] [name] was instructed to call 911 with any severe reactions post vaccine: Difficulty breathing  Swelling of face and throat  A fast heartbeat  A bad rash all over body  Dizziness and weakness

## 2021-04-29 ENCOUNTER — Inpatient Hospital Stay: Payer: Medicare Other

## 2021-04-29 ENCOUNTER — Inpatient Hospital Stay: Payer: Medicare Other | Attending: Hematology | Admitting: Hematology

## 2021-04-29 ENCOUNTER — Other Ambulatory Visit: Payer: Self-pay

## 2021-04-29 VITALS — BP 147/61 | HR 66 | Temp 97.3°F | Resp 17 | Wt 109.7 lb

## 2021-04-29 DIAGNOSIS — C8201 Follicular lymphoma grade I, lymph nodes of head, face, and neck: Secondary | ICD-10-CM | POA: Diagnosis not present

## 2021-04-29 DIAGNOSIS — C8291 Follicular lymphoma, unspecified, lymph nodes of head, face, and neck: Secondary | ICD-10-CM | POA: Diagnosis not present

## 2021-04-29 DIAGNOSIS — Z85110 Personal history of malignant carcinoid tumor of bronchus and lung: Secondary | ICD-10-CM | POA: Insufficient documentation

## 2021-04-29 LAB — CMP (CANCER CENTER ONLY)
ALT: 17 U/L (ref 0–44)
AST: 21 U/L (ref 15–41)
Albumin: 4.2 g/dL (ref 3.5–5.0)
Alkaline Phosphatase: 52 U/L (ref 38–126)
Anion gap: 5 (ref 5–15)
BUN: 22 mg/dL (ref 8–23)
CO2: 29 mmol/L (ref 22–32)
Calcium: 9.8 mg/dL (ref 8.9–10.3)
Chloride: 106 mmol/L (ref 98–111)
Creatinine: 0.96 mg/dL (ref 0.44–1.00)
GFR, Estimated: 58 mL/min — ABNORMAL LOW (ref >60)
Glucose, Bld: 83 mg/dL (ref 70–99)
Potassium: 4.8 mmol/L (ref 3.5–5.1)
Sodium: 140 mmol/L (ref 135–145)
Total Bilirubin: 0.5 mg/dL (ref 0.3–1.2)
Total Protein: 6.9 g/dL (ref 6.5–8.1)

## 2021-04-29 LAB — CBC WITH DIFFERENTIAL (CANCER CENTER ONLY)
Abs Immature Granulocytes: 0.02 10*3/uL (ref 0.00–0.07)
Basophils Absolute: 0.1 10*3/uL (ref 0.0–0.1)
Basophils Relative: 1 %
Eosinophils Absolute: 0.1 10*3/uL (ref 0.0–0.5)
Eosinophils Relative: 1 %
HCT: 43.2 % (ref 36.0–46.0)
Hemoglobin: 13.9 g/dL (ref 12.0–15.0)
Immature Granulocytes: 0 %
Lymphocytes Relative: 24 %
Lymphs Abs: 1.8 10*3/uL (ref 0.7–4.0)
MCH: 29.1 pg (ref 26.0–34.0)
MCHC: 32.2 g/dL (ref 30.0–36.0)
MCV: 90.4 fL (ref 80.0–100.0)
Monocytes Absolute: 0.6 10*3/uL (ref 0.1–1.0)
Monocytes Relative: 9 %
Neutro Abs: 4.9 10*3/uL (ref 1.7–7.7)
Neutrophils Relative %: 65 %
Platelet Count: 223 10*3/uL (ref 150–400)
RBC: 4.78 MIL/uL (ref 3.87–5.11)
RDW: 14.6 % (ref 11.5–15.5)
WBC Count: 7.5 10*3/uL (ref 4.0–10.5)
nRBC: 0 % (ref 0.0–0.2)

## 2021-04-29 LAB — LACTATE DEHYDROGENASE: LDH: 165 U/L (ref 98–192)

## 2021-05-05 NOTE — Progress Notes (Signed)
[name]    HEMATOLOGY/ONCOLOGY CLINIC NOTE  Date of Service: .04/29/2021   Patient Care Team: Deland Pretty, MD as PCP - General (Internal Medicine)  CHIEF COMPLAINTS/PURPOSE OF CONSULTATION:   Follow-up for continued management of follicular lymphoma  HISTORY OF PRESENTING ILLNESS:   Please see previous notes for details of initial presentation  INTERVAL HISTORY:  [name] [name] is here for continued follow-up of her follicular lymphoma clinic visit about 6 months ago. She is currently undergoing bereavement having lost her husband Dr. Laverta [place] in December 2022. She notes that she does have a lot of friends and family who has been supporting her and she is functioning as well as can be in the circumstances.  She notes no acute new physical symptoms. New lumps or bumps. No fevers no chills no night sweats no unexpected weight loss.  Labs done today were reviewed with her in details. We discussed repeat imaging prior to her next clinic visit to monitor for progression of her follicular lymphoma.   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   POLYPECTOMY     2001,2006-all colonic mucosa    SUBMANDIBULAR GLAND EXCISION Right 12/19/2015   Procedure: BIOPSY OF RIGHT SUBMANDIBULAR MASS;  Surgeon: Jerrell Belfast, MD;  Location[facility];  Service: ENT;  Laterality: Right;    SOCIAL HISTORY: Social History   Socioeconomic History   Marital status: Married    Spouse name: Not on file   Number of children: Not on  file   Years of education: Not on file   Highest education level: Not on file  Occupational History   Not on file  Tobacco Use   Smoking status: Not on file   Smokeless tobacco: Never  Vaping Use   Vaping Use: Never used  Substance and Sexual Activity   Alcohol use: No    Alcohol/week: 0.0 standard drinks   Drug use: No   Sexual activity: Not on file  Other Topics Concern   Not on file  Social History Narrative   ** Merged History Encounter **       Social Determinants of Health   Financial Resource Strain: Not on file  Food Insecurity: Not on file  Transportation Needs: Not on file  Physical Activity: Not on file  Stress: Not on file  Social Connections: Not on file  Intimate Partner Violence: Not on file    FAMILY HISTORY: Family History  Problem Relation Age of Onset   Colon cancer Neg Hx    Colon polyps Neg Hx    Esophageal cancer Neg Hx    Rectal cancer Neg Hx    Stomach cancer Neg Hx     ALLERGIES:  is allergic to no known allergies.  MEDICATIONS:  Current Outpatient Medications  Medication Sig Dispense Refill   atorvastatin (LIPITOR) 20 MG tablet Take 20 mg by mouth.     BD PEN NEEDLE NANO U/F 32G X 4 MM MISC every other  day.      Cholecalciferol (VITAMIN D) 2000 units CAPS Take 2,000 Units by mouth daily.     COVID-19 mRNA bivalent vaccine, Moderna, (MODERNA COVID-19 BIVAL BOOSTER) 50 MCG/0.5ML injection Inject into the muscle. 0.5 mL 0   COVID-19 mRNA vaccine, Moderna, 100 MCG/0.5ML injection Inject into the muscle. 0.25 mL 0   Multiple Vitamins-Minerals (MULTIVITAMIN WITH MINERALS) tablet Take 1 tablet by mouth.     Omega-3 Fatty Acids (FISH OIL TRIPLE STRENGTH) 1400 MG CAPS Take 1 capsule by mouth daily.     No current facility-administered medications for this visit.    REVIEW OF SYSTEMS: .10 Point review of Systems was done is negative except as noted above.   PHYSICAL EXAMINATION:  ECOG PERFORMANCE STATUS: 1 - Symptomatic but completely  ambulatory  . Vitals:   04/29/21 1058  BP: (!) 147/61  Pulse: 66  Resp: 17  Temp: (!) 97.3 F (36.3 C)  SpO2: 97%   Filed Weights   04/29/21 1058  Weight: 109 lb 11.2 oz (49.8 kg)   .Body mass index is 20.73 kg/m.   NAD.[name] GENERAL:alert, in no acute distress and comfortable SKIN: no acute rashes, no significant lesions EYES: conjunctiva are pink and non-injected, sclera anicteric OROPHARYNX: MMM, no exudates, no oropharyngeal erythema or ulceration NECK: supple, no JVD LYMPH:  no palpable lymphadenopathy in the cervical, axillary or inguinal regions LUNGS: clear to auscultation b/l with normal respiratory effort HEART: regular rate & rhythm ABDOMEN:  normoactive bowel sounds , non tender, not distended. Extremity: no pedal edema PSYCH: alert & oriented x 3 with fluent speech NEURO: no focal motor/sensory deficits   LABORATORY DATA:  I have reviewed the data as listed  CBC Latest Ref Rng & Units 04/29/2021 10/27/2020 04/03/2020  WBC 4.0 - 10.5 K/uL 7.5 7.8 8.7  Hemoglobin 12.0 - 15.0 g/dL 13.9 14.6 14.6  Hematocrit 36.0 - 46.0 % 43.2 43.7 45.8  Platelets 150 - 400 K/uL 223 213 231  HGB 13.6  . CMP Latest Ref Rng & Units 04/29/2021 10/27/2020 04/03/2020  Glucose 70 - 99 mg/dL 83 89 72  BUN 8 - 23 mg/dL _0 Creatinine 0.44 - 1.00 mg/dL 0.96 0.81 0.93  Sodium 135 - 145 mmol/L 140 142 143  Potassium 3.5 - 5.1 mmol/L 4.8 4.0 4.7  Chloride 98 - 111 mmol/L 106 107 107  CO2 22 - 32 mmol/L _1 Calcium 8.9 - 10.3 mg/dL 9.8 10.0 10.2  Total Protein 6.5 - 8.1 g/dL 6.9 6.8 7.0  Total Bilirubin 0.3 - 1.2 mg/dL 0.5 0.4 0.5  Alkaline Phos 38 - 126 U/L 52 61 61  AST 15 - 41 U/L _2 ALT 0 - 44 U/L _3 . Lab Results  Component Value Date   LDH 165 04/29/2021         RADIOGRAPHIC STUDIES: I have personally reviewed the radiological images as listed and agreed with the findings in the report. No results found.  ASSESSMENT & PLAN:   87 y.o.  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 Patient imaging shows that this is at least a stage III A with mesenteric involvement as well. No overt evidence of bone marrow involvement but bone marrow biopsy is not planned for additional staging based on patient preference and the fact that it will not change treatment at this time . LDH level within normal limits  FLIPI score - intermediate risk with 5 year OS 78% and ten-year overall survival 51% (from data in the pre-rituxan era) S/p ISRT to cervical LN  Disease. 07/08/17 PET/CT showed slightly increasing activity levels in the mesentery. 02/21/18 CT C/A/P revealed Areas of abnormal, ill-defined soft tissue in the mesentery and along the peritoneal surface identified on previous PET-CT are similar today. No new or progressive interval findings. 2.  Aortic Atherosclerois.  04/02/2019 CT C/A/P([id number]) revealed "Postoperative changes of right middle lobectomy as before. No signs of new adenopathy with stable perienteric and pericolonic soft tissue. Stable lower retroperitoneal soft tissue along the common iliac vasculature. Stable mild pancreatic ductal distension, attention on follow-up Aortic Atherosclerosis (ICD10-I70.0)."  PLAN: -Patient's lab results from today were discussed with her in details.  CBC CMP within normal limits LDH is normal at 165 Patient has no lab or clinical evidence of follicular lymphoma progression at this time. Her last scans were in December 2020 and we discussed and patient is agreeable to repeating CT scans prior to her next clinic visit with [place] in 6 months for continued surveillance. She is aware of constitutional symptoms to monitor and inform us of prior to that. No other acute new focal symptoms  #2 history of lung carcinoid status post surgery in 2008. No concern with recurrence at this time.  #3  Hypercalcemia -likely from Forteo. Pt stopped Forteo and her calcium has since normalized.    FOLLOW UP: RTC with Dr Irene Limbo with labs in 6 months CT neck/CAP in 5 months  All of the patient's questions were answered with apparent satisfaction. The patient knows to call the clinic with any problems, questions or concerns.   Sullivan Lone MD [place] AAHIVMS [facility] Hematology/Oncology Physician [facility]

## 2021-05-26 DIAGNOSIS — M810 Age-related osteoporosis without current pathological fracture: Secondary | ICD-10-CM | POA: Diagnosis not present

## 2021-06-15 DIAGNOSIS — H1789 Other corneal scars and opacities: Secondary | ICD-10-CM | POA: Diagnosis not present

## 2021-06-15 DIAGNOSIS — H52203 Unspecified astigmatism, bilateral: Secondary | ICD-10-CM | POA: Diagnosis not present

## 2021-06-15 DIAGNOSIS — H353131 Nonexudative age-related macular degeneration, bilateral, early dry stage: Secondary | ICD-10-CM | POA: Diagnosis not present

## 2021-06-15 DIAGNOSIS — Z961 Presence of intraocular lens: Secondary | ICD-10-CM | POA: Diagnosis not present

## 2021-07-07 DIAGNOSIS — L817 Pigmented purpuric dermatosis: Secondary | ICD-10-CM | POA: Diagnosis not present

## 2021-07-07 DIAGNOSIS — Z85828 Personal history of other malignant neoplasm of skin: Secondary | ICD-10-CM | POA: Diagnosis not present

## 2021-07-07 DIAGNOSIS — L821 Other seborrheic keratosis: Secondary | ICD-10-CM | POA: Diagnosis not present

## 2021-07-07 DIAGNOSIS — L820 Inflamed seborrheic keratosis: Secondary | ICD-10-CM | POA: Diagnosis not present

## 2021-07-07 DIAGNOSIS — D1801 Hemangioma of skin and subcutaneous tissue: Secondary | ICD-10-CM | POA: Diagnosis not present

## 2021-07-07 DIAGNOSIS — L812 Freckles: Secondary | ICD-10-CM | POA: Diagnosis not present

## 2021-07-14 ENCOUNTER — Ambulatory Visit (HOSPITAL_BASED_OUTPATIENT_CLINIC_OR_DEPARTMENT_OTHER): Payer: Medicare Other | Admitting: Radiology

## 2021-07-16 ENCOUNTER — Ambulatory Visit (HOSPITAL_BASED_OUTPATIENT_CLINIC_OR_DEPARTMENT_OTHER): Payer: Medicare Other | Admitting: Radiology

## 2021-07-17 ENCOUNTER — Other Ambulatory Visit (HOSPITAL_BASED_OUTPATIENT_CLINIC_OR_DEPARTMENT_OTHER): Payer: Self-pay

## 2021-07-17 ENCOUNTER — Ambulatory Visit (HOSPITAL_BASED_OUTPATIENT_CLINIC_OR_DEPARTMENT_OTHER)
Admission: RE | Admit: 2021-07-17 | Discharge: 2021-07-17 | Disposition: A | Discharge status: Home / Self Care | Payer: Medicare Other | Source: Ambulatory Visit | Attending: Internal Medicine | Admitting: Internal Medicine

## 2021-07-17 ENCOUNTER — Encounter (HOSPITAL_BASED_OUTPATIENT_CLINIC_OR_DEPARTMENT_OTHER): Payer: Self-pay | Admitting: Radiology

## 2021-07-17 DIAGNOSIS — Z1231 Encounter for screening mammogram for malignant neoplasm of breast: Secondary | ICD-10-CM | POA: Diagnosis not present

## 2021-07-17 IMAGING — MG MM DIGITAL SCREENING BILAT W/ TOMO AND CAD
8 series · 8 of 24 positions shown · non-contrast
Comparison: Previous exam(s).

CLINICAL DATA: Screening.

EXAM:
DIGITAL SCREENING BILATERAL MAMMOGRAM WITH TOMOSYNTHESIS AND CAD
TECHNIQUE: Bilateral screening digital craniocaudal and mediolateral oblique
mammograms were obtained. Bilateral screening digital breast
tomosynthesis was performed. The images were evaluated with
computer-aided detection.

[L MLO synth-2D]
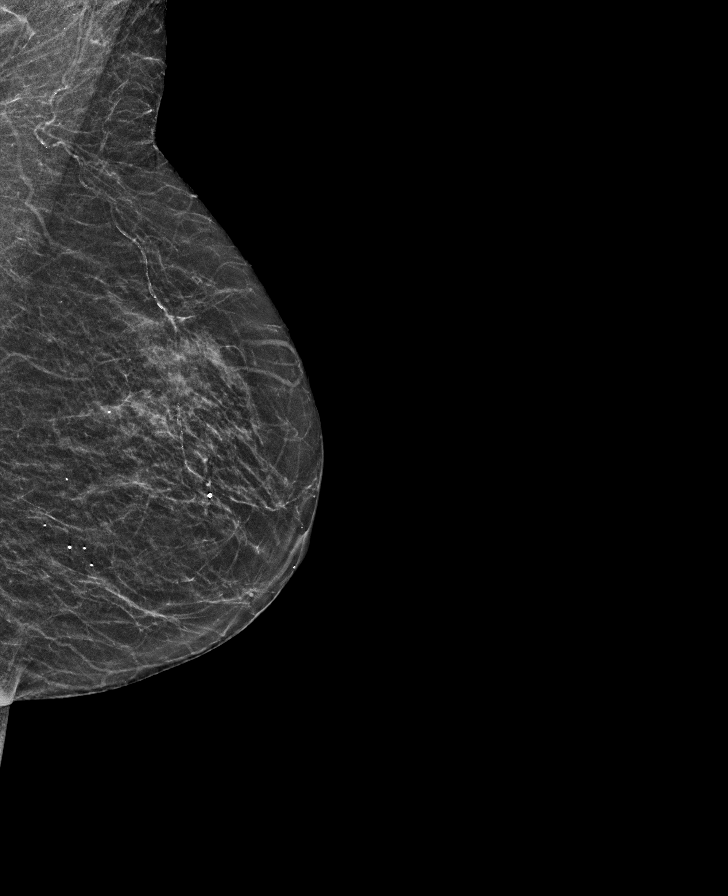

[R MLO synth-2D]
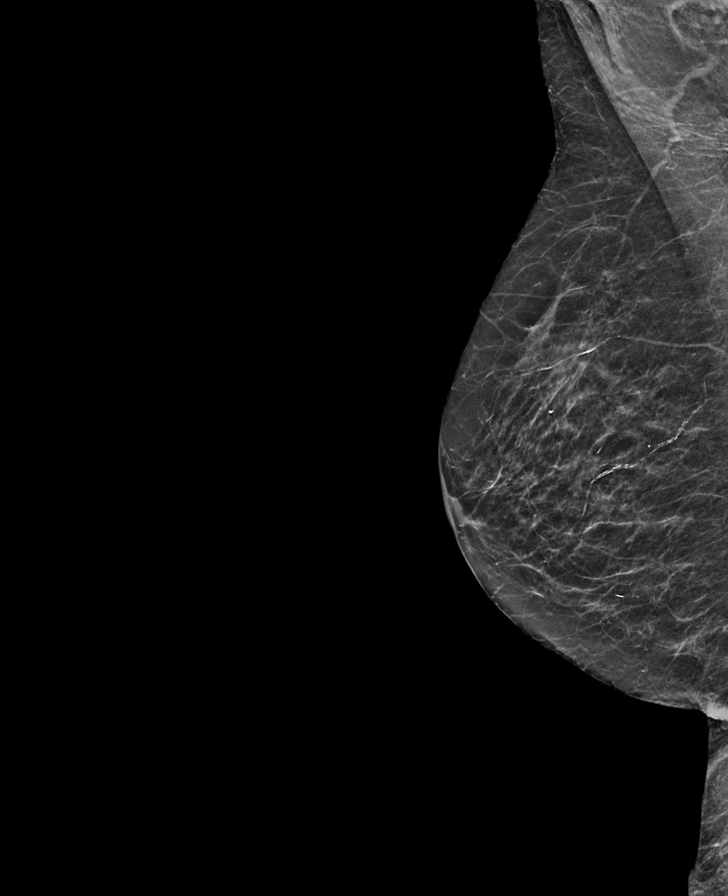

[R CC synth-2D]
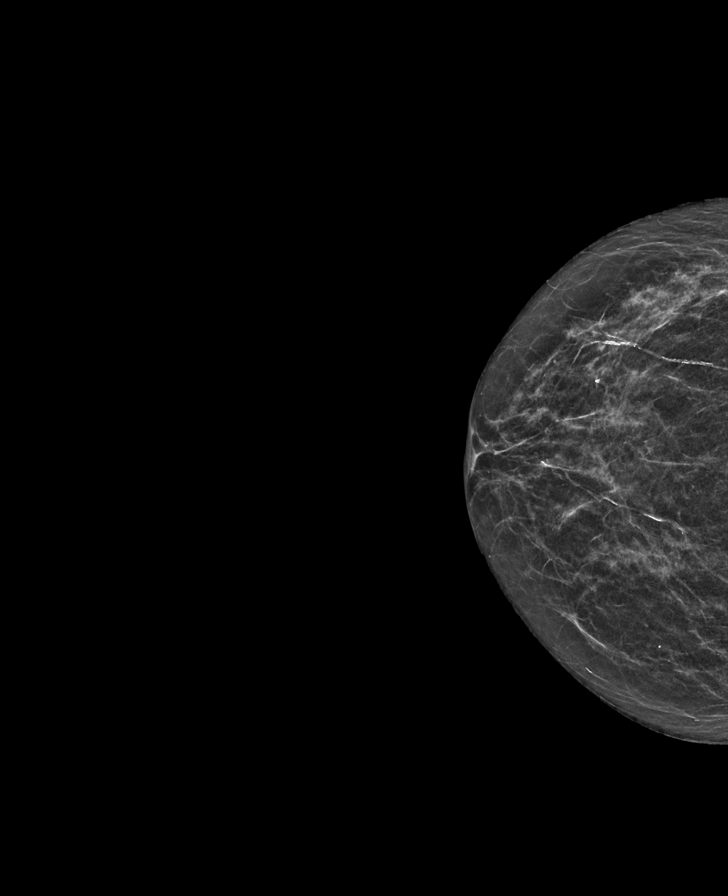

[L CC synth-2D]
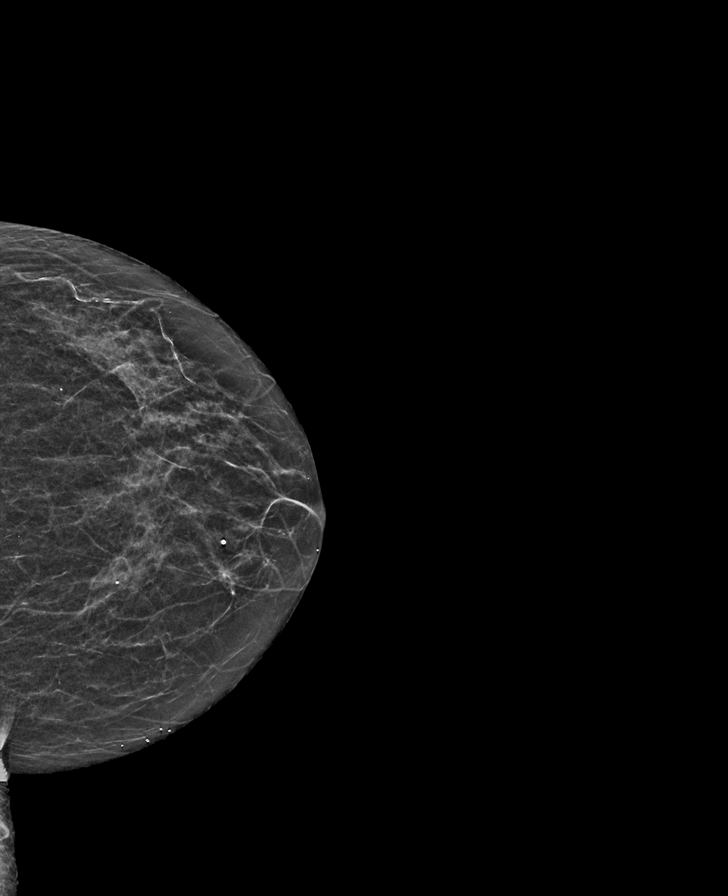

[R CC tomo · tomo slice 21/42.0]
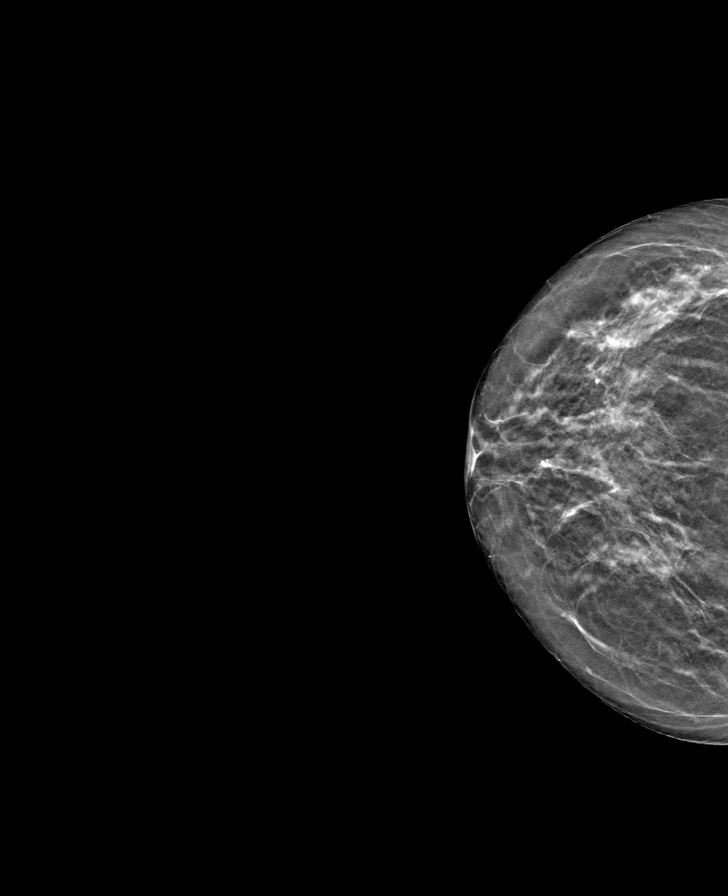

[R MLO tomo · tomo slice 22/43.0]
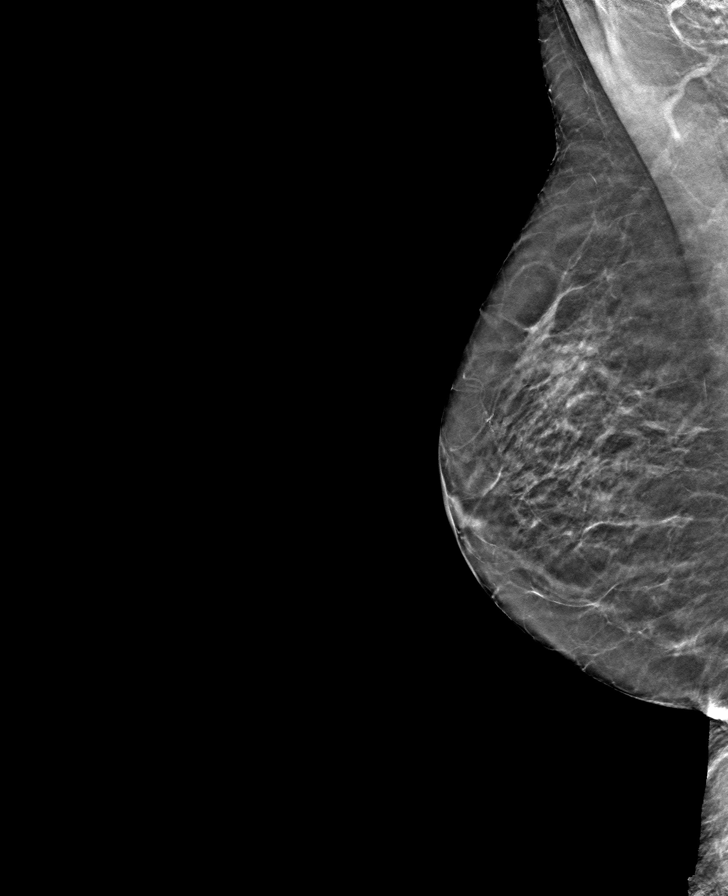

[L MLO tomo · tomo slice 21/41.0]
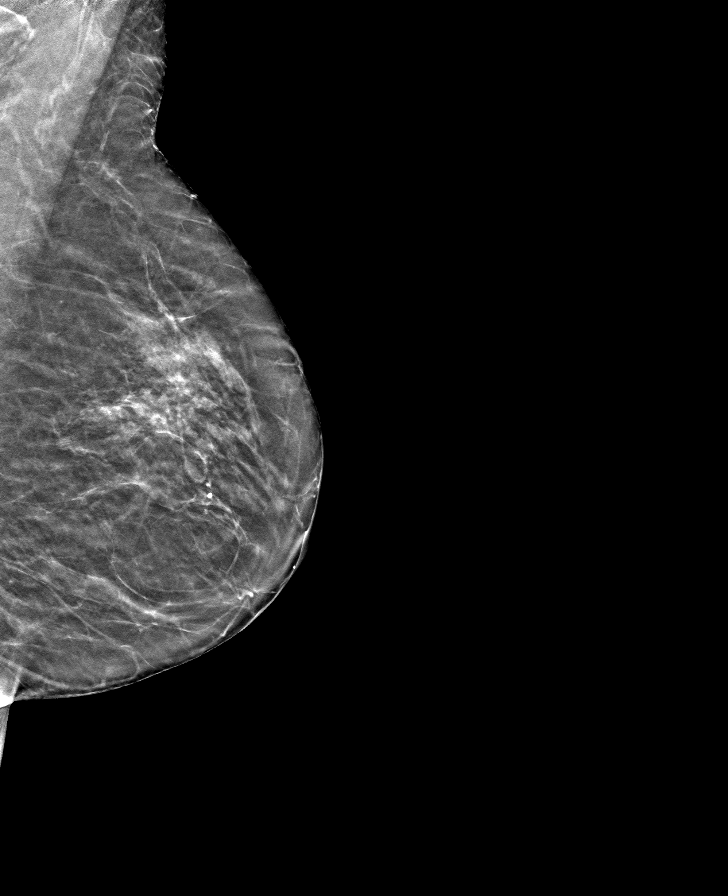

[L CC tomo · tomo slice 21/42.0]
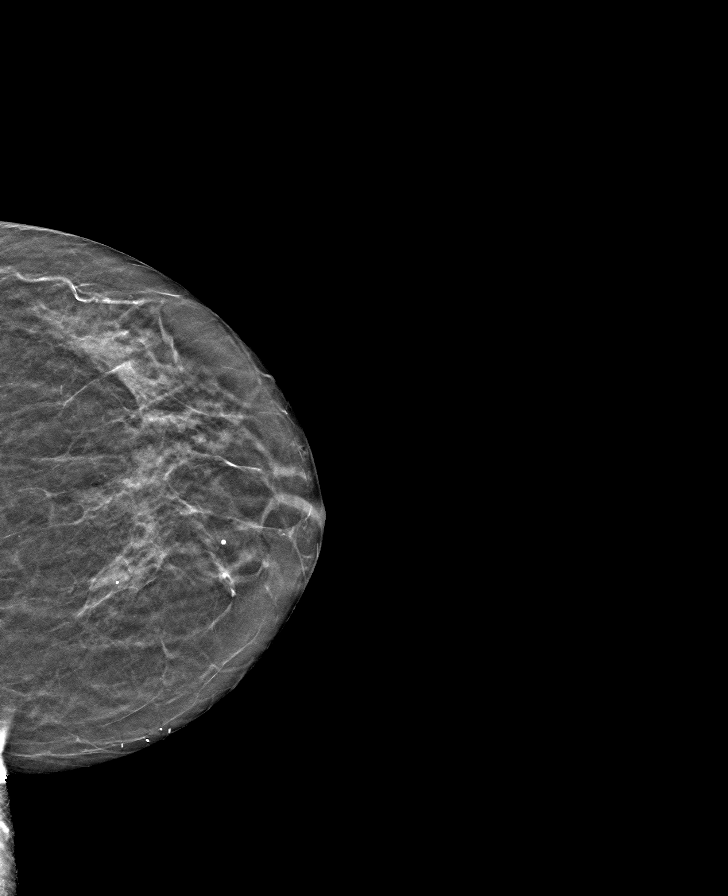

[8 of 24 positions shown; findings below may reference images not displayed]

ACR Breast Density Category b: There are scattered areas of
fibroglandular density.
FINDINGS: There are no findings suspicious for malignancy.
IMPRESSION: No mammographic evidence of malignancy. A result letter of this
screening mammogram will be mailed directly to the patient.

RECOMMENDATION:
Screening mammogram in one year. (Code:51-O-LD2)

BI-RADS CATEGORY  1: Negative.

## 2021-08-05 DIAGNOSIS — J069 Acute upper respiratory infection, unspecified: Secondary | ICD-10-CM | POA: Diagnosis not present

## 2021-08-11 DIAGNOSIS — Z8639 Personal history of other endocrine, nutritional and metabolic disease: Secondary | ICD-10-CM | POA: Diagnosis not present

## 2021-08-11 DIAGNOSIS — J159 Unspecified bacterial pneumonia: Secondary | ICD-10-CM | POA: Diagnosis not present

## 2021-08-12 ENCOUNTER — Other Ambulatory Visit: Payer: Medicare Other

## 2021-08-12 ENCOUNTER — Ambulatory Visit (HOSPITAL_COMMUNITY): Payer: Medicare Other

## 2021-08-21 ENCOUNTER — Other Ambulatory Visit: Payer: Self-pay

## 2021-08-21 DIAGNOSIS — C8201 Follicular lymphoma grade I, lymph nodes of head, face, and neck: Secondary | ICD-10-CM

## 2021-08-24 ENCOUNTER — Other Ambulatory Visit: Payer: Self-pay

## 2021-08-24 ENCOUNTER — Inpatient Hospital Stay: Payer: Medicare Other | Attending: Hematology

## 2021-08-24 DIAGNOSIS — C8231 Follicular lymphoma grade IIIa, lymph nodes of head, face, and neck: Secondary | ICD-10-CM | POA: Insufficient documentation

## 2021-08-24 DIAGNOSIS — C8201 Follicular lymphoma grade I, lymph nodes of head, face, and neck: Secondary | ICD-10-CM

## 2021-08-24 LAB — CMP (CANCER CENTER ONLY)
ALT: 23 U/L (ref 0–44)
AST: 23 U/L (ref 15–41)
Albumin: 4 g/dL (ref 3.5–5.0)
Alkaline Phosphatase: 77 U/L (ref 38–126)
Anion gap: 6 (ref 5–15)
BUN: 15 mg/dL (ref 8–23)
CO2: 30 mmol/L (ref 22–32)
Calcium: 9.4 mg/dL (ref 8.9–10.3)
Chloride: 104 mmol/L (ref 98–111)
Creatinine: 0.93 mg/dL (ref 0.44–1.00)
GFR, Estimated: >60 mL/min (ref >60)
Glucose, Bld: 99 mg/dL (ref 70–99)
Potassium: 4.8 mmol/L (ref 3.5–5.1)
Sodium: 140 mmol/L (ref 135–145)
Total Bilirubin: 0.5 mg/dL (ref 0.3–1.2)
Total Protein: 7.6 g/dL (ref 6.5–8.1)

## 2021-08-24 LAB — CBC WITH DIFFERENTIAL (CANCER CENTER ONLY)
Abs Immature Granulocytes: 0.01 10*3/uL (ref 0.00–0.07)
Basophils Absolute: 0.1 10*3/uL (ref 0.0–0.1)
Basophils Relative: 2 %
Eosinophils Absolute: 0.2 10*3/uL (ref 0.0–0.5)
Eosinophils Relative: 2 %
HCT: 44.7 % (ref 36.0–46.0)
Hemoglobin: 14.1 g/dL (ref 12.0–15.0)
Immature Granulocytes: 0 %
Lymphocytes Relative: 31 %
Lymphs Abs: 2 10*3/uL (ref 0.7–4.0)
MCH: 28.6 pg (ref 26.0–34.0)
MCHC: 31.5 g/dL (ref 30.0–36.0)
MCV: 90.7 fL (ref 80.0–100.0)
Monocytes Absolute: 0.6 10*3/uL (ref 0.1–1.0)
Monocytes Relative: 8 %
Neutro Abs: 3.8 10*3/uL (ref 1.7–7.7)
Neutrophils Relative %: 57 %
Platelet Count: 462 10*3/uL — ABNORMAL HIGH (ref 150–400)
RBC: 4.93 MIL/uL (ref 3.87–5.11)
RDW: 14.9 % (ref 11.5–15.5)
WBC Count: 6.6 10*3/uL (ref 4.0–10.5)
nRBC: 0 % (ref 0.0–0.2)

## 2021-08-24 LAB — LACTATE DEHYDROGENASE: LDH: 163 U/L (ref 98–192)

## 2021-08-25 ENCOUNTER — Encounter (HOSPITAL_COMMUNITY): Payer: Self-pay

## 2021-08-25 ENCOUNTER — Ambulatory Visit (HOSPITAL_COMMUNITY)
Admission: RE | Admit: 2021-08-25 | Discharge: 2021-08-25 | Disposition: A | Discharge status: Home / Self Care | Payer: Medicare Other | Source: Ambulatory Visit | Attending: Hematology | Admitting: Hematology

## 2021-08-25 DIAGNOSIS — N200 Calculus of kidney: Secondary | ICD-10-CM | POA: Diagnosis not present

## 2021-08-25 DIAGNOSIS — M439 Deforming dorsopathy, unspecified: Secondary | ICD-10-CM | POA: Diagnosis not present

## 2021-08-25 DIAGNOSIS — I700 Atherosclerosis of aorta: Secondary | ICD-10-CM | POA: Diagnosis not present

## 2021-08-25 DIAGNOSIS — K6389 Other specified diseases of intestine: Secondary | ICD-10-CM | POA: Diagnosis not present

## 2021-08-25 DIAGNOSIS — N281 Cyst of kidney, acquired: Secondary | ICD-10-CM | POA: Diagnosis not present

## 2021-08-25 DIAGNOSIS — R221 Localized swelling, mass and lump, neck: Secondary | ICD-10-CM | POA: Diagnosis not present

## 2021-08-25 DIAGNOSIS — C8201 Follicular lymphoma grade I, lymph nodes of head, face, and neck: Secondary | ICD-10-CM | POA: Diagnosis not present

## 2021-08-25 DIAGNOSIS — K449 Diaphragmatic hernia without obstruction or gangrene: Secondary | ICD-10-CM | POA: Diagnosis not present

## 2021-08-25 DIAGNOSIS — I517 Cardiomegaly: Secondary | ICD-10-CM | POA: Diagnosis not present

## 2021-08-25 IMAGING — CT CT CHEST-ABD-PELV W/ CM
2 of 9 series · 13 of 46 positions shown, 15 images · IV contrast (OMNIPAQUE)
Comparison: 04/02/2019

CLINICAL DATA: History of follicular lymphoma diagnosed in 8139.
Radiation therapy complete. History of carcinoid tumor diagnosed in
2006. * Tracking Code: BO *

EXAM:
CT CHEST, ABDOMEN, AND PELVIS WITH CONTRAST
TECHNIQUE: Multidetector CT imaging of the chest, abdomen and pelvis was
performed following the standard protocol during bolus
administration of intravenous contrast.

[Series 3: coronal arterial · coronal · arterial · 0.47mm/px · 3 of 70 slices shown]
[im 18/70  soft-tissue]
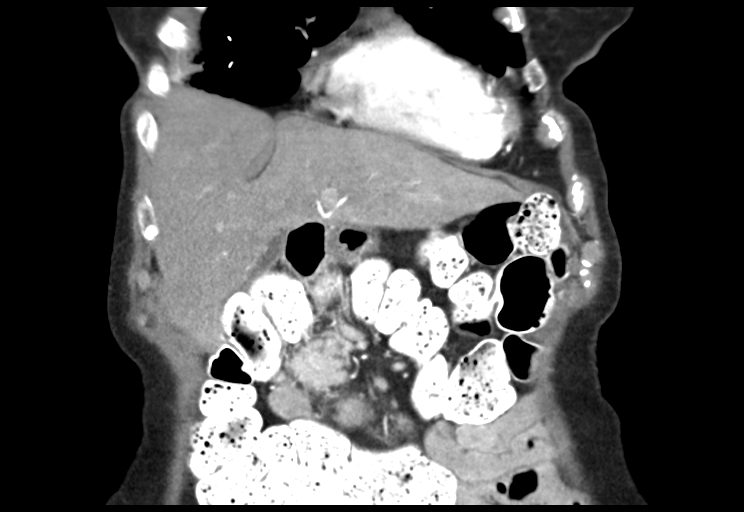
[im 35/70  soft-tissue]
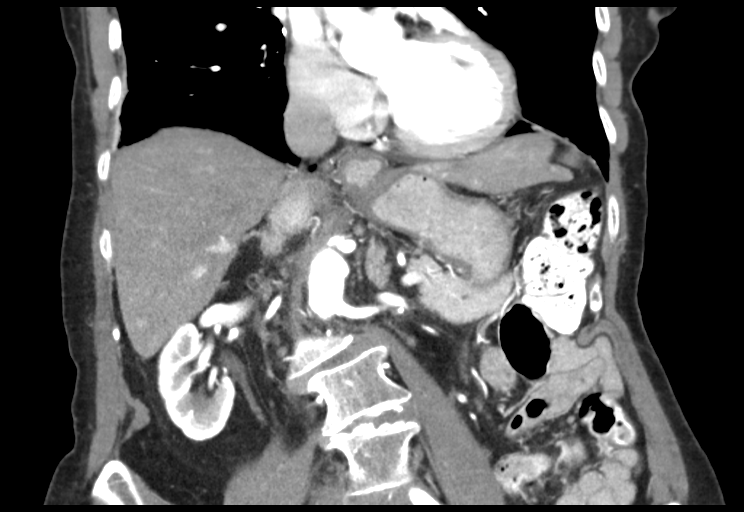
[im 52/70  soft-tissue]
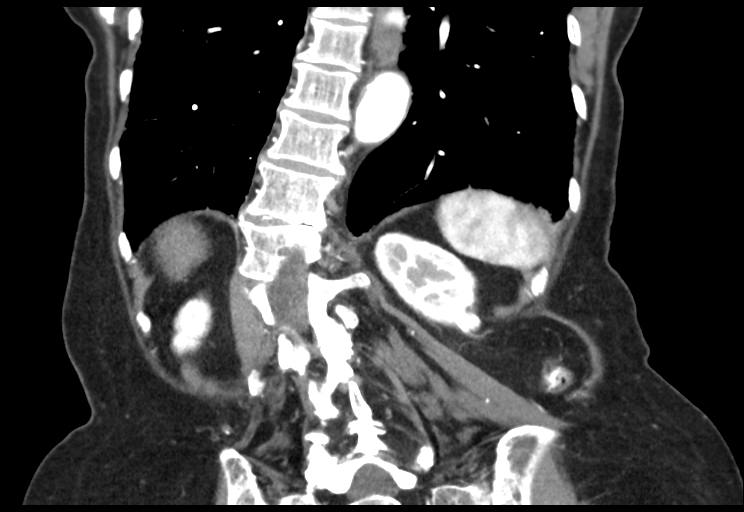

[Series 7: axial venous · axial · portal-venous · 0.69mm/px · z∈[-527,-44]mm · 10 of 197 slices shown, 12 images]
[im 18/197  soft-tissue]
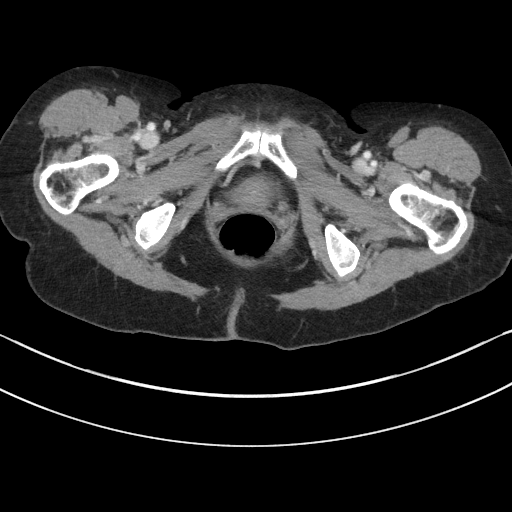
[im 18/197  bone]
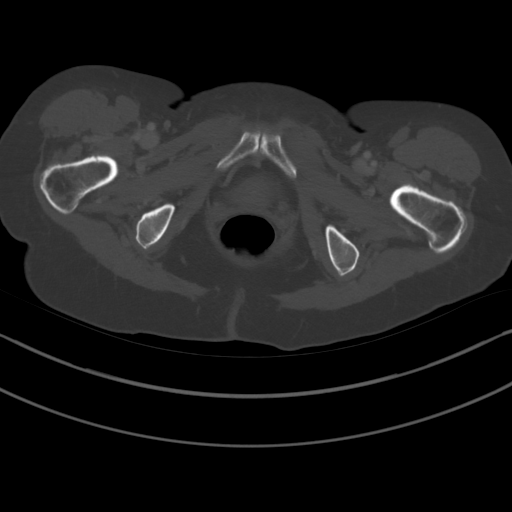
[im 36/197  soft-tissue]
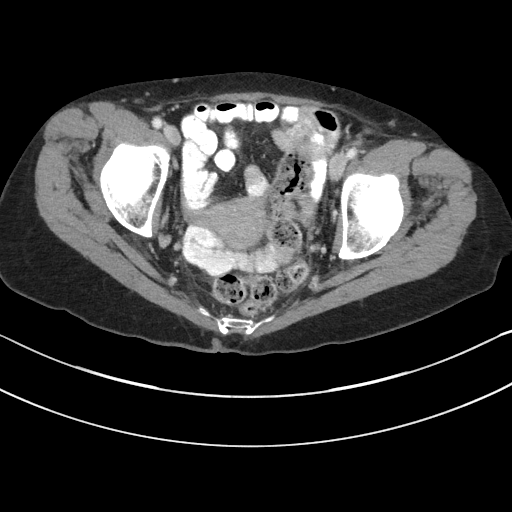
[im 54/197  soft-tissue]
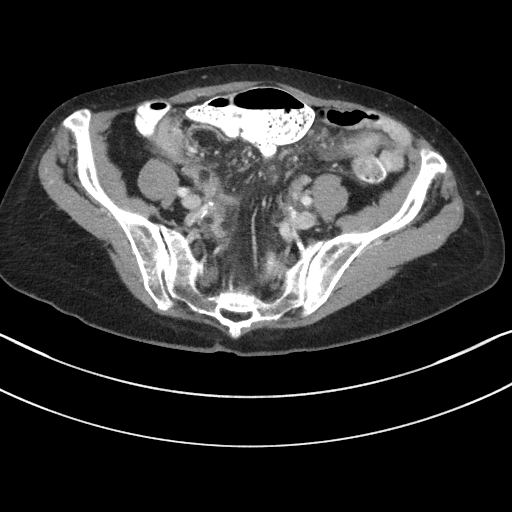
[im 72/197  soft-tissue]
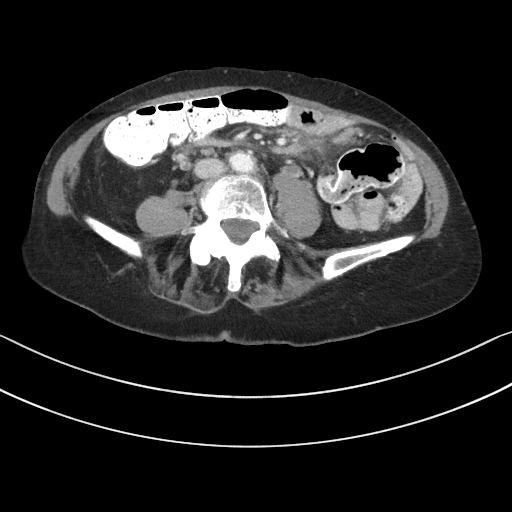
[im 90/197  soft-tissue]
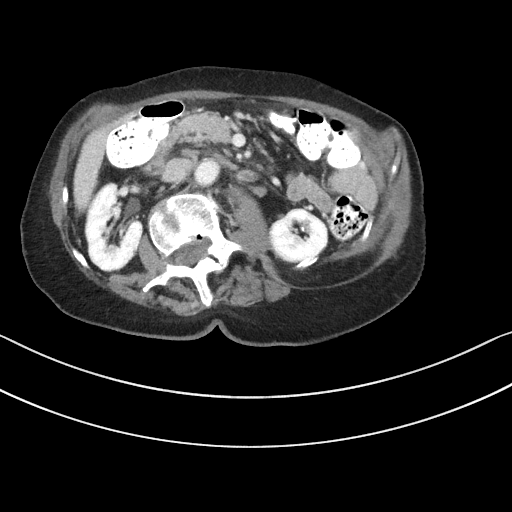
[im 107/197  soft-tissue]
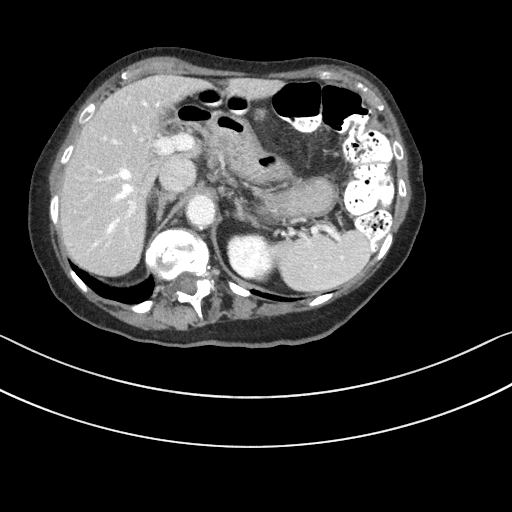
[im 125/197  soft-tissue]
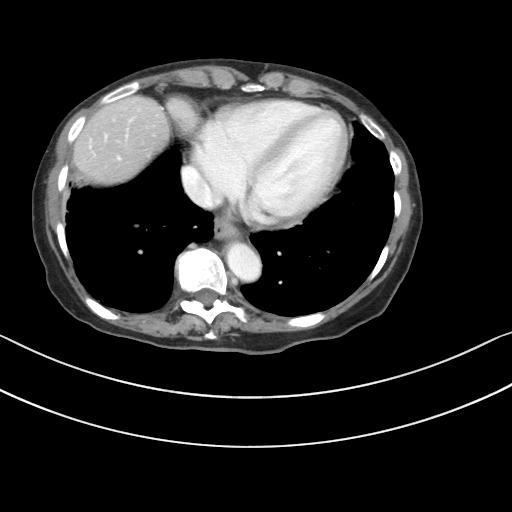
[im 143/197  soft-tissue]
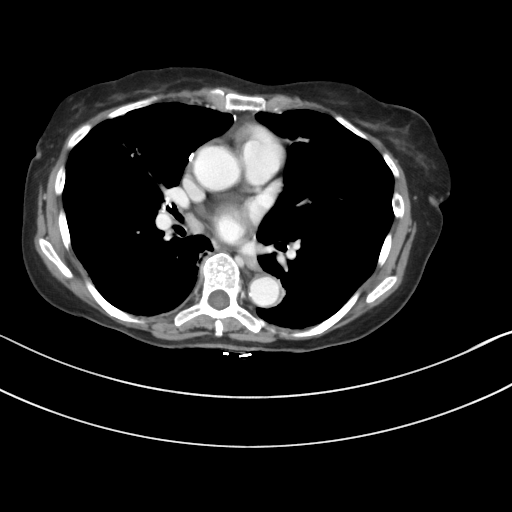
[im 161/197  soft-tissue]
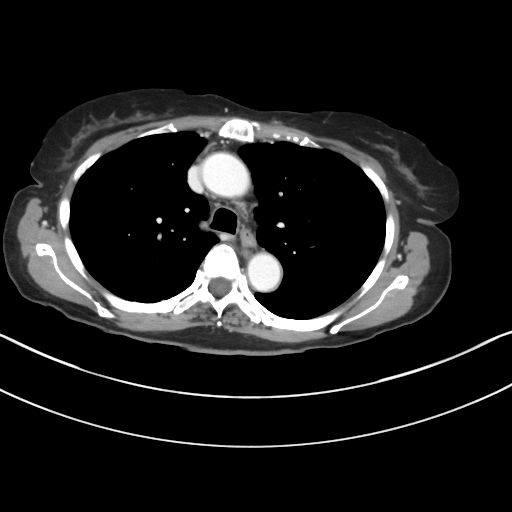
[im 161/197  bone]
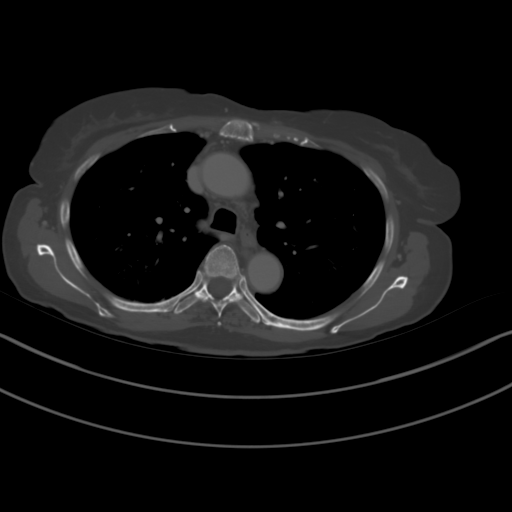
[im 179/197  soft-tissue]
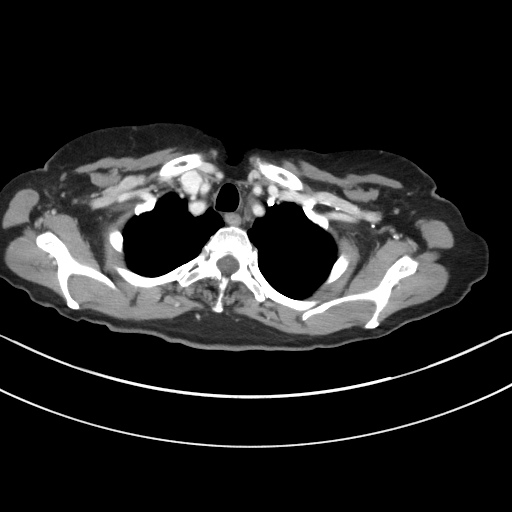

[13 of 46 positions shown; findings below may reference images not displayed]

RADIATION DOSE REDUCTION: This exam was performed according to the
departmental dose-optimization program which includes automated
exposure control, adjustment of the mA and/or kV according to
patient size and/or use of iterative reconstruction technique.

CONTRAST:  100mL OMNIPAQUE IOHEXOL 300 MG/ML  SOLN
FINDINGS: CT CHEST FINDINGS

Cardiovascular: Aortic atherosclerosis. Mild cardiomegaly, without
pericardial effusion. No central pulmonary embolism, on this
non-dedicated study.

Mediastinum/Nodes: No axillary adenopathy. No mediastinal or hilar
adenopathy. Small hiatal hernia. Tiny bilateral thyroid nodules. Not
clinically significant; no follow-up imaging recommended (ref: [HOSPITAL]. [DATE]): 143-50)

Lungs/Pleura: No pleural fluid.  Status post right middle lobectomy.

Development of multiple, bilateral areas of primarily pleural-based
consolidation. These are identified on series 11. A more irregular
medial right lower lobe area of probable consolidation on 72/11
measures on the order of 9 mm.

Minimal clustered "tree-in-bud" nodularity within the
peribronchovascular left upper lobe on 55/11 is new.

Musculoskeletal: No acute osseous abnormality.

CT ABDOMEN PELVIS FINDINGS

Hepatobiliary: Normal liver. Normal gallbladder, without biliary
ductal dilatation.

Pancreas: Upper normal pancreatic duct caliber, similar. No acute
inflammation.

Spleen: Normal in size, without focal abnormality.

Adrenals/Urinary Tract: Normal adrenal glands. Punctate lower pole
left renal collecting system calculus. Subcentimeter interpolar
right renal cyst. No hydronephrosis. Normal urinary bladder.

Stomach/Bowel: Proximal gastric underdistention. The cecum is
redundant, terminating in the midline of the upper pelvis. Normal
small bowel caliber.

Vascular/Lymphatic: Advanced aortic and branch vessel
atherosclerosis. Subcentimeter pre caval nodes are similar and not
pathologic by size criteria.

Multiple small jejunal mesenteric nodes are not significantly
changed.

Soft tissue thickening involving the caudal most portion of the
ileocolic mesentery including at 5.7 x 1.9 cm on 139/7. 6.0 x 2.8 cm
on the prior exam, suggesting stability.

Soft tissue thickening inferolateral to the cecum measures 2.9 x
cm on 141/7 versus 3.9 x 1.8 cm on the prior exam.

1.0 cm node adjacent the right common iliac artery at 135/7 measured
9 mm on the prior.

A node anterior to the right common iliac vein measures 11 mm on
137/7 and is significantly enlarged from 3-4 mm on the prior.

Reproductive: Dystrophic calcifications within the uterus. No
adnexal mass.

Other: Trace fluid within the pelvic cul-de-sac is new on 170/7. No
evidence of omental or peritoneal disease.

Musculoskeletal: S shaped thoracolumbar spine curvature.
IMPRESSION: 1. Development of multiple bilateral primarily pleural-based areas
of pulmonary consolidation. Differential considerations include
infection, recurrent lymphoma, or less likely metastasis from
patient's carcinoid tumor. If there are infectious symptoms,
consider antibiotic therapy and CT follow-up at 6-8 weeks. If not,
consider further evaluation with PET.
2. Primarily similar appearance of areas of soft tissue thickening
within the small bowel mesentery and pericolonic fat. These have an
appearance which favors treated lymphoma. There is however, new
isolated right common iliac adenopathy, suspicious for recurrent
lymphoma or metastatic disease.
3. Status post right middle lobectomy.
4. Left nephrolithiasis
5. New trace pelvic fluid, nonspecific.

## 2021-08-25 IMAGING — CT CT NECK W/ CM
5 series · 16 of 33 positions shown, 18 images · IV contrast (OMNIPAQUE)
Comparison: CT Chest, Abdomen, and Pelvis the same day reported
separately.

Previous neck CT 12/08/2015.

CLINICAL DATA: 86-year-old female with treated follicular lymphoma,
diagnosed in 9457 with right neck masses at that time. Restaging.

EXAM:
CT NECK WITH CONTRAST
TECHNIQUE: Multidetector CT imaging of the neck was performed using the
standard protocol following the bolus administration of intravenous
contrast.

[Series 2: axial neck · axial · 0.39mm/px · z∈[-30,+70]mm · 3 of 102 slices shown]
[im 26/102  bone]
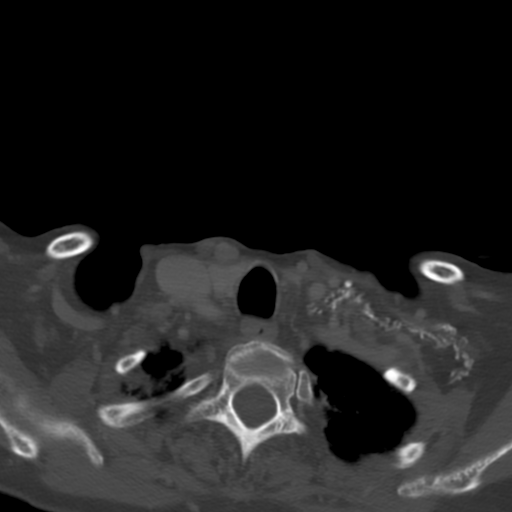
[im 51/102  bone]
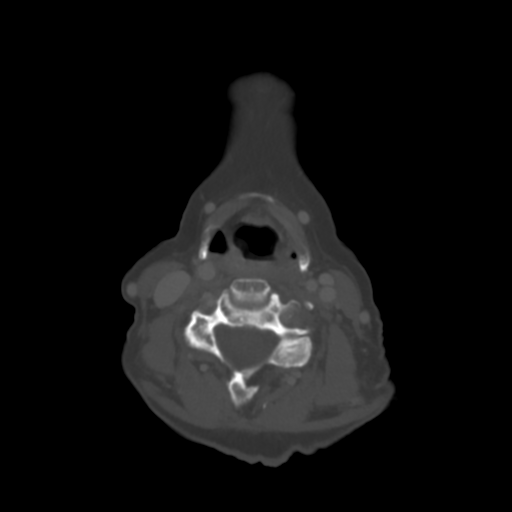
[im 76/102  bone]
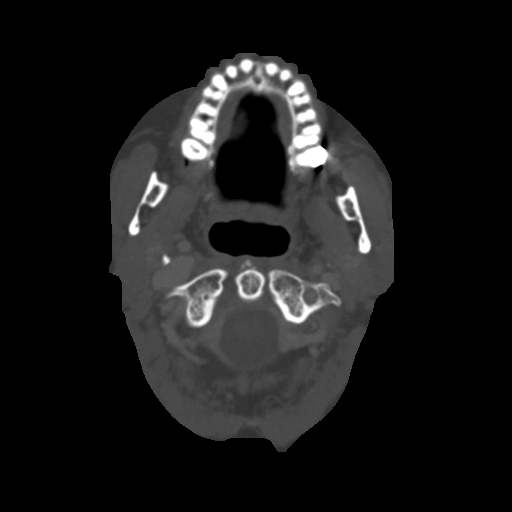

[Series 4: axial bone · axial · 0.39mm/px · z∈[-14,+54]mm · 2 of 102 slices shown]
[im 34/102  bone]
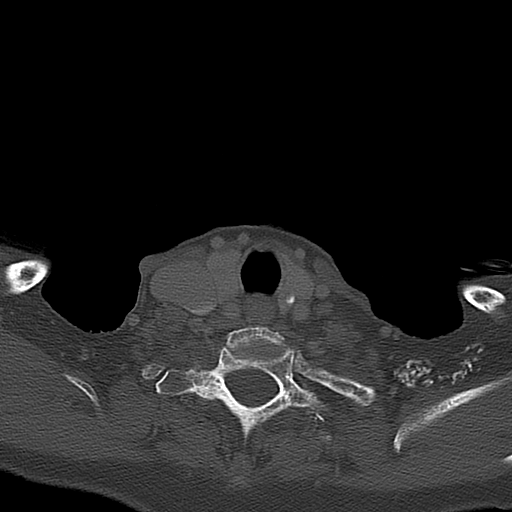
[im 68/102  bone]
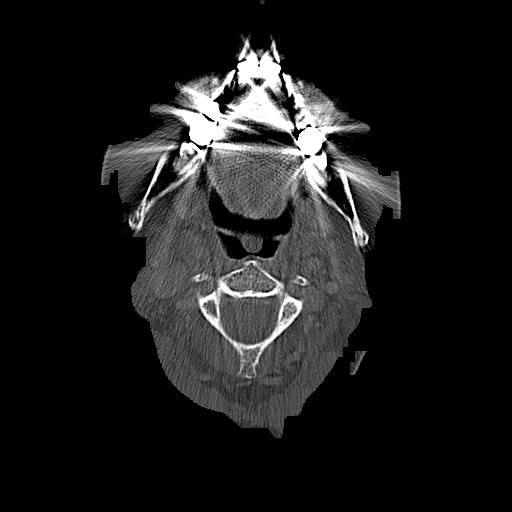

[Series 602: axial reformats neck · axial · 0.40mm/px · z∈[-80,+37]mm · 3 of 126 slices shown, 4 images]
[im 32/126  soft-tissue]
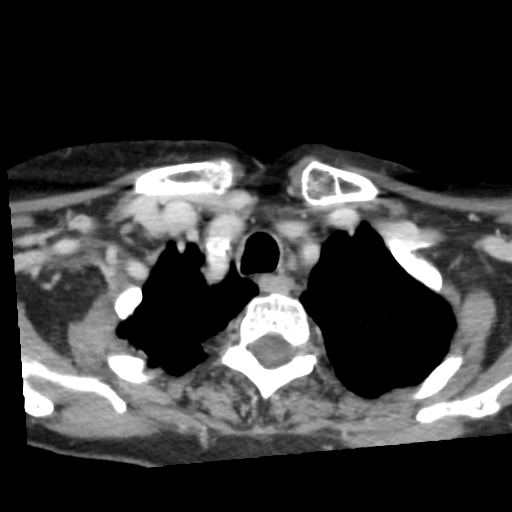
[im 32/126  bone]
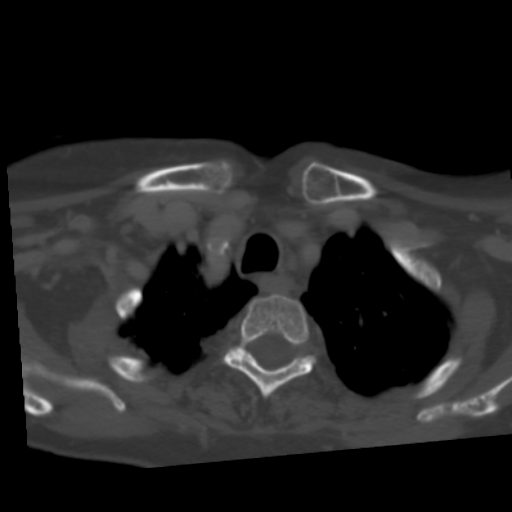
[im 63/126  bone]
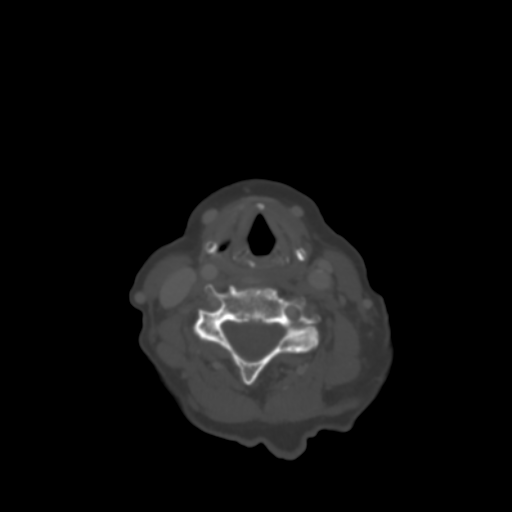
[im 94/126  bone]
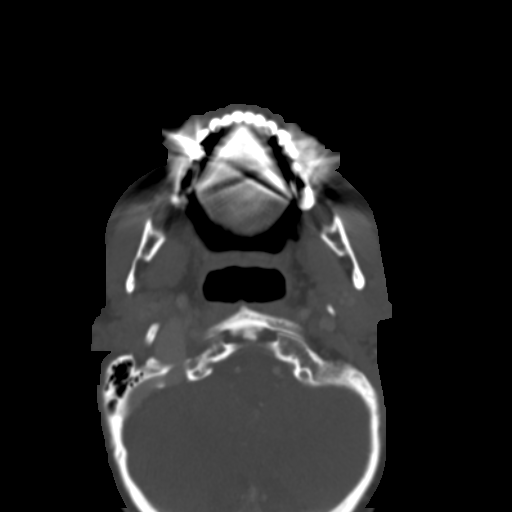

[Series 603: coronal neck · coronal · 0.40mm/px · 3 of 90 slices shown]
[im 23/90  bone]
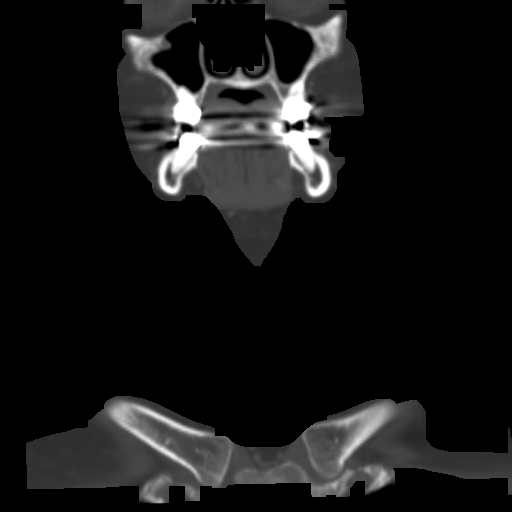
[im 38/90  bone]
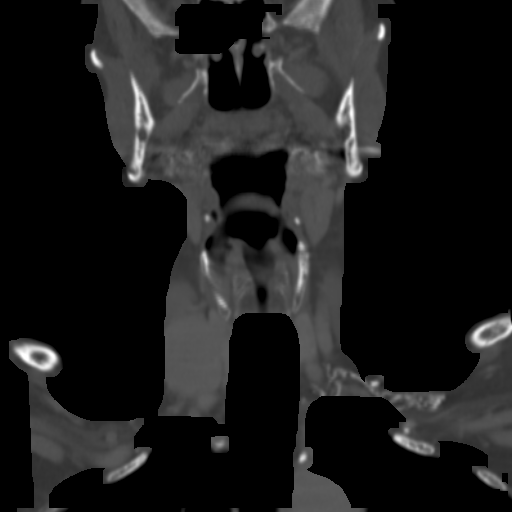
[im 53/90  bone]
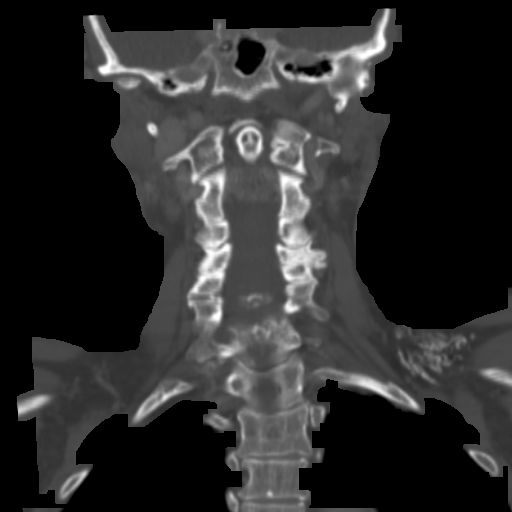

[Series 604: sagittal images · sagittal · 0.40mm/px · 5 of 103 slices shown, 6 images]
[im 35/103  bone]
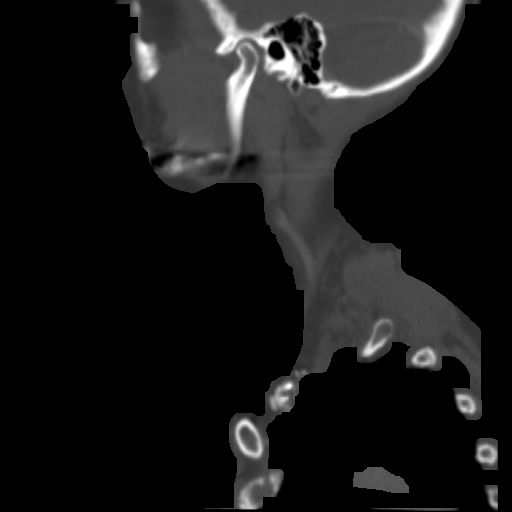
[im 43/103  bone]
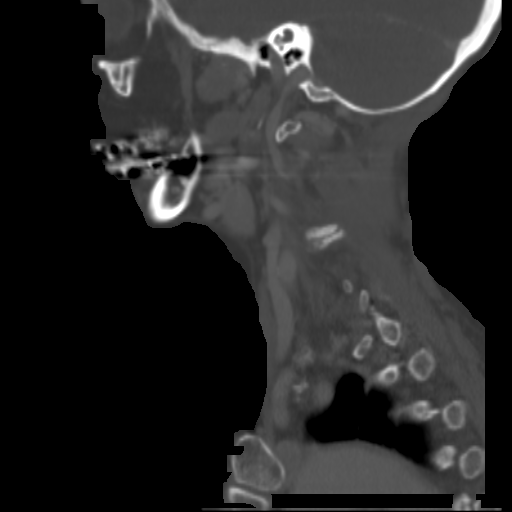
[im 52/103  soft-tissue]
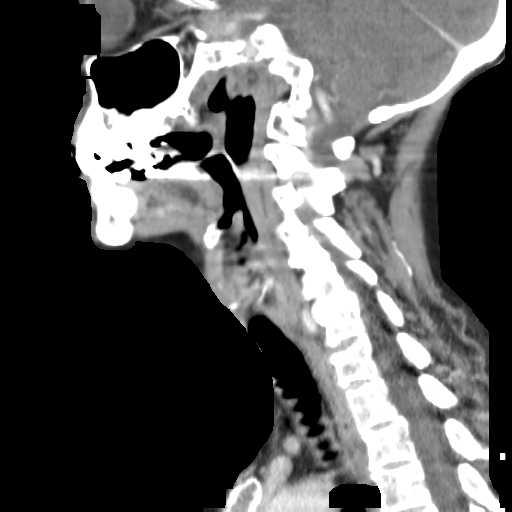
[im 52/103  bone]
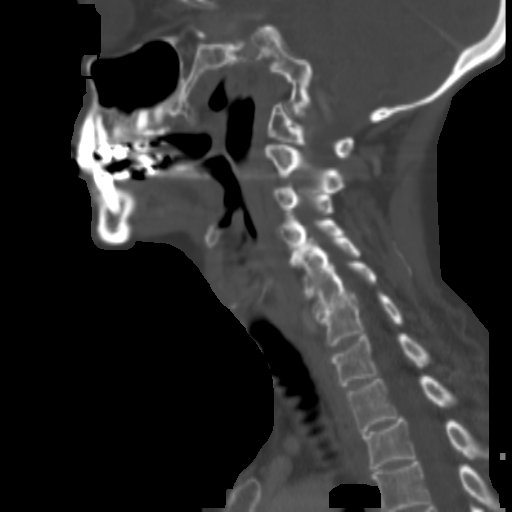
[im 60/103  bone]
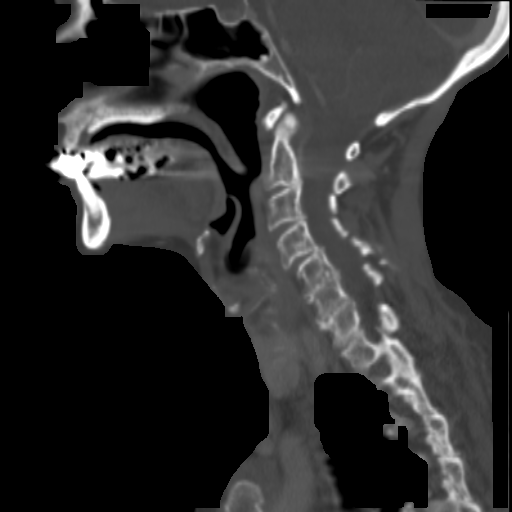
[im 69/103  bone]
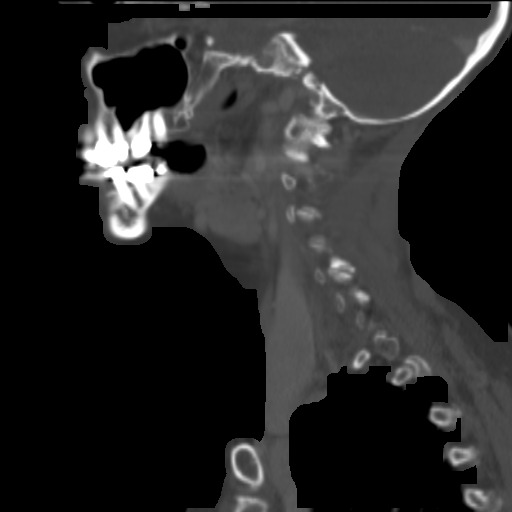

[16 of 33 positions shown; findings below may reference images not displayed]

RADIATION DOSE REDUCTION: This exam was performed according to the
departmental dose-optimization program which includes automated
exposure control, adjustment of the mA and/or kV according to
patient size and/or use of iterative reconstruction technique.

CONTRAST:  100mL OMNIPAQUE IOHEXOL 300 MG/ML  SOLN
FINDINGS: Pharynx and larynx: Laryngeal and pharyngeal soft tissue contours
are stable since 9457 and remain within normal limits. Negative
parapharyngeal spaces. Retropharyngeal course of the right ICA is
chronic. Otherwise negative retropharyngeal space.

Salivary glands: Negative; subtle chronic soft tissue scarring along
the lateral aspect of the right submandibular gland is at the site
of one of the two dominant neck masses on the 9457 exam.

Thyroid: Subcentimeter hypodense bilateral thyroid nodules are
chronic, Not clinically significant; no follow-up imaging
recommended (ref: [HOSPITAL]. [DATE]): 143-50).

Lymph nodes: Resolved discrete right level 1 and level 2
lymphadenopathy seen in 9457.

No abnormal cervical lymph nodes now.

Vascular: Major vascular structures in the neck and at the skull
base remain patent. Small chronic venous collaterals in the left
supraclavicular fossa are unchanged since 9457 and appear benign
(series 2, image 76), extending to series 2, image 61 in the
subcutaneous fat. The contralateral right IJ is dominant. Partially
retropharyngeal course of the right ICA. Mild for age carotid
bifurcation atherosclerosis.

Limited intracranial: Stable, negative; Calcified atherosclerosis at
the skull base.

Visualized orbits: Stable, negative.

Mastoids and visualized paranasal sinuses: Visualized paranasal
sinuses and mastoids are clear.

Skeleton: Chronic cervical spine degeneration with acquired
appearing C5-C6 interbody ankylosis. Mild chronic spondylolisthesis
at that level. No acute or suspicious osseous lesion identified in
the neck.

Upper chest: Reported separately.
IMPRESSION: 1. Resolved right cervical lymphadenopathy seen in 9457.
2. No new abnormality in the neck.
3.  CT Chest, Abdomen, and Pelvis are reported separately.

## 2021-08-25 MED ORDER — SODIUM CHLORIDE (PF) 0.9 % IJ SOLN
INTRAMUSCULAR | Status: AC
  Filled 2021-08-25: qty 50

## 2021-08-25 MED ORDER — IOHEXOL 300 MG/ML  SOLN
100.0000 mL | Freq: Once | INTRAMUSCULAR | Status: AC | PRN
  Administered 2021-08-25: 100 mL via INTRAVENOUS

## 2021-09-09 DIAGNOSIS — J159 Unspecified bacterial pneumonia: Secondary | ICD-10-CM | POA: Diagnosis not present

## 2021-09-09 DIAGNOSIS — Z8639 Personal history of other endocrine, nutritional and metabolic disease: Secondary | ICD-10-CM | POA: Diagnosis not present

## 2021-09-17 ENCOUNTER — Other Ambulatory Visit (HOSPITAL_BASED_OUTPATIENT_CLINIC_OR_DEPARTMENT_OTHER): Payer: Self-pay

## 2021-10-21 ENCOUNTER — Telehealth: Payer: Self-pay | Admitting: Hematology

## 2021-10-21 NOTE — Telephone Encounter (Signed)
Rescheduled upcoming appointment due to provider's PAL. Patient is aware of changes. ?

## 2021-10-27 ENCOUNTER — Ambulatory Visit: Payer: Medicare Other | Admitting: Hematology

## 2021-10-27 ENCOUNTER — Other Ambulatory Visit: Payer: Medicare Other

## 2021-12-07 ENCOUNTER — Ambulatory Visit: Payer: Medicare Other | Admitting: Hematology

## 2021-12-07 ENCOUNTER — Other Ambulatory Visit: Payer: Medicare Other

## 2021-12-08 DIAGNOSIS — M810 Age-related osteoporosis without current pathological fracture: Secondary | ICD-10-CM | POA: Diagnosis not present

## 2021-12-16 ENCOUNTER — Inpatient Hospital Stay: Payer: Medicare Other | Attending: Hematology

## 2021-12-16 ENCOUNTER — Other Ambulatory Visit: Payer: Self-pay

## 2021-12-16 ENCOUNTER — Inpatient Hospital Stay (HOSPITAL_BASED_OUTPATIENT_CLINIC_OR_DEPARTMENT_OTHER): Payer: Medicare Other | Admitting: Hematology

## 2021-12-16 VITALS — BP 174/70 | HR 75 | Temp 97.3°F | Resp 18 | Ht 61.0 in | Wt 111.7 lb

## 2021-12-16 DIAGNOSIS — C8201 Follicular lymphoma grade I, lymph nodes of head, face, and neck: Secondary | ICD-10-CM

## 2021-12-16 DIAGNOSIS — Z85110 Personal history of malignant carcinoid tumor of bronchus and lung: Secondary | ICD-10-CM | POA: Insufficient documentation

## 2021-12-16 DIAGNOSIS — C8231 Follicular lymphoma grade IIIa, lymph nodes of head, face, and neck: Secondary | ICD-10-CM | POA: Diagnosis not present

## 2021-12-16 DIAGNOSIS — R918 Other nonspecific abnormal finding of lung field: Secondary | ICD-10-CM | POA: Diagnosis not present

## 2021-12-16 LAB — CBC WITH DIFFERENTIAL/PLATELET
Abs Immature Granulocytes: 0.02 10*3/uL (ref 0.00–0.07)
Basophils Absolute: 0.1 10*3/uL (ref 0.0–0.1)
Basophils Relative: 1 %
Eosinophils Absolute: 0.2 10*3/uL (ref 0.0–0.5)
Eosinophils Relative: 2 %
HCT: 45.3 % (ref 36.0–46.0)
Hemoglobin: 15 g/dL (ref 12.0–15.0)
Immature Granulocytes: 0 %
Lymphocytes Relative: 25 %
Lymphs Abs: 2.2 10*3/uL (ref 0.7–4.0)
MCH: 29.6 pg (ref 26.0–34.0)
MCHC: 33.1 g/dL (ref 30.0–36.0)
MCV: 89.5 fL (ref 80.0–100.0)
Monocytes Absolute: 0.7 10*3/uL (ref 0.1–1.0)
Monocytes Relative: 8 %
Neutro Abs: 5.6 10*3/uL (ref 1.7–7.7)
Neutrophils Relative %: 64 %
Platelets: 202 10*3/uL (ref 150–400)
RBC: 5.06 MIL/uL (ref 3.87–5.11)
RDW: 14 % (ref 11.5–15.5)
WBC: 8.8 10*3/uL (ref 4.0–10.5)
nRBC: 0 % (ref 0.0–0.2)

## 2021-12-16 LAB — CMP (CANCER CENTER ONLY)
ALT: 18 U/L (ref 0–44)
AST: 24 U/L (ref 15–41)
Albumin: 4.3 g/dL (ref 3.5–5.0)
Alkaline Phosphatase: 68 U/L (ref 38–126)
Anion gap: 4 — ABNORMAL LOW (ref 5–15)
BUN: 21 mg/dL (ref 8–23)
CO2: 29 mmol/L (ref 22–32)
Calcium: 9.8 mg/dL (ref 8.9–10.3)
Chloride: 109 mmol/L (ref 98–111)
Creatinine: 0.89 mg/dL (ref 0.44–1.00)
GFR, Estimated: >60 mL/min (ref >60)
Glucose, Bld: 85 mg/dL (ref 70–99)
Potassium: 4.4 mmol/L (ref 3.5–5.1)
Sodium: 142 mmol/L (ref 135–145)
Total Bilirubin: 0.4 mg/dL (ref 0.3–1.2)
Total Protein: 6.7 g/dL (ref 6.5–8.1)

## 2021-12-16 LAB — LACTATE DEHYDROGENASE: LDH: 167 U/L (ref 98–192)

## 2021-12-17 ENCOUNTER — Telehealth: Payer: Self-pay | Admitting: Hematology

## 2021-12-17 NOTE — Progress Notes (Signed)
[name]    HEMATOLOGY/ONCOLOGY CLINIC NOTE  Date of Service: 12/16/2021   Patient Care Team: Deland Pretty, MD as PCP - General (Internal Medicine)  CHIEF COMPLAINTS/PURPOSE OF CONSULTATION:   Follow-up for continued management of follicular lymphoma  HISTORY OF PRESENTING ILLNESS:   Please see previous notes for details of initial presentation  INTERVAL HISTORY: Mrs. Thedford is here for continued follow-up of her follicular lymphoma clinic visit about 6 months ago. She reports She is doing well with no new symptoms or concerns.  No fever, chills, night sweats. No new lumps, bumps, or lesions/rashes. No new or unexpected weight loss. No other new or acute focal symptoms.  Labs done today were reviewed with her in details.  We discussed her CT chest, abd, pelvis w contrast done 08/25/2021. Patient's CT showed some fluid with both areas of pulmonary consolidation which were likely related to her resolving pneumonia soon prior to this which was treated with antibiotics.   MEDICAL HISTORY:  Past Medical History:  Diagnosis Date   Cancer ([facility])    follicular lymphoma   Cataract    History of colon polyps    all colonic mucosa   Hyperlipidemia    on lipitor   Osteoporosis   Lung carcinoid 05/2006 s/p surgery Colonoscopy recently - 1 polyp Osteoporosis - on forteo, Prolia (2 years)  SURGICAL HISTORY: Past Surgical History:  Procedure Laterality Date   COLONOSCOPY     2001,2006   EYE SURGERY Bilateral    cataract removal   LUNG REMOVAL, PARTIAL Right 05-2006   carcinois tumor 05-2006   POLYPECTOMY     2001,2006-all colonic mucosa    SUBMANDIBULAR GLAND EXCISION Right 12/19/2015   Procedure: BIOPSY OF RIGHT SUBMANDIBULAR MASS;  Surgeon: Jerrell Belfast, MD;  Location: [facility] OR;  Service: ENT;  Laterality: Right;    SOCIAL HISTORY: Social History   Socioeconomic History   Marital status: Married    Spouse name: Not on file   Number of children: Not on file   Years of  education: Not on file   Highest education level: Not on file  Occupational History   Not on file  Tobacco Use   Smoking status: Not on file   Smokeless tobacco: Never  Vaping Use   Vaping Use: Never used  Substance and Sexual Activity   Alcohol use: No    Alcohol/week: 0.0 standard drinks of alcohol   Drug use: No   Sexual activity: Not on file  Other Topics Concern   Not on file  Social History Narrative   ** Merged History Encounter **       Social Determinants of Health   Financial Resource Strain: Not on file  Food Insecurity: Not on file  Transportation Needs: Not on file  Physical Activity: Not on file  Stress: Not on file  Social Connections: Not on file  Intimate Partner Violence: Not on file    FAMILY HISTORY: Family History  Problem Relation Age of Onset   Colon cancer Neg Hx    Colon polyps Neg Hx    Esophageal cancer Neg Hx    Rectal cancer Neg Hx    Stomach cancer Neg Hx     ALLERGIES:  is allergic to benzonatate and no known allergies.  MEDICATIONS:  Current Outpatient Medications  Medication Sig Dispense Refill   atorvastatin (LIPITOR) 20 MG tablet Take 20 mg by mouth.     BD PEN NEEDLE NANO U/F 32G X 4 MM MISC every other day.  Cholecalciferol (VITAMIN D) 2000 units CAPS Take 2,000 Units by mouth daily.     COVID-19 mRNA bivalent vaccine, Moderna, (MODERNA COVID-19 BIVAL BOOSTER) 50 MCG/0.5ML injection Inject into the muscle. 0.5 mL 0   COVID-19 mRNA vaccine, Moderna, 100 MCG/0.5ML injection Inject into the muscle. 0.25 mL 0   Multiple Vitamins-Minerals (MULTIVITAMIN WITH MINERALS) tablet Take 1 tablet by mouth.     Omega-3 Fatty Acids (FISH OIL TRIPLE STRENGTH) 1400 MG CAPS Take 1 capsule by mouth daily.     No current facility-administered medications for this visit.    REVIEW OF SYSTEMS: .10 Point review of Systems was done is negative except as noted above.   PHYSICAL EXAMINATION:  ECOG PERFORMANCE STATUS: 1 - Symptomatic but  completely ambulatory  . Vitals:   12/16/21 0847  BP: (!) 174/70  Pulse: 75  Resp: 18  Temp: (!) 97.3 F (36.3 C)  SpO2: 96%   Filed Weights   12/16/21 0847  Weight: 111 lb 11.2 oz (50.7 kg)   .Body mass index is 21.11 kg/m.   NAD GENERAL:alert, in no acute distress and comfortable SKIN: no acute rashes, no significant lesions EYES: conjunctiva are pink and non-injected, sclera anicteric NECK: supple, no JVD LYMPH:  no palpable lymphadenopathy in the cervical, axillary or inguinal regions LUNGS: clear to auscultation b/l with normal respiratory effort HEART: regular rate & rhythm ABDOMEN:  normoactive bowel sounds , non tender, not distended. Extremity: no pedal edema PSYCH: alert & oriented x 3 with fluent speech NEURO: no focal motor/sensory deficits  LABORATORY DATA:  I have reviewed the data as listed     Latest Ref Rng & Units 12/16/2021    8:37 AM 08/24/2021    8:58 AM 04/29/2021   10:23 AM  CBC  WBC 4.0 - 10.5 K/uL 8.8  6.6  7.5   Hemoglobin 12.0 - 15.0 g/dL 15.0  14.1  13.9   Hematocrit 36.0 - 46.0 % 45.3  44.7  43.2   Platelets 150 - 400 K/uL 202  462  223   HGB 13.6  .    Latest Ref Rng & Units 12/16/2021    8:37 AM 08/24/2021    8:58 AM 04/29/2021   10:23 AM  CMP  Glucose 70 - 99 mg/dL 85  99  83   BUN 8 - 23 mg/dL _0 Creatinine 0.44 - 1.00 mg/dL 0.89  0.93  0.96   Sodium 135 - 145 mmol/L 142  140  140   Potassium 3.5 - 5.1 mmol/L 4.4  4.8  4.8   Chloride 98 - 111 mmol/L 109  104  106   CO2 22 - 32 mmol/L _1 Calcium 8.9 - 10.3 mg/dL 9.8  9.4  9.8   Total Protein 6.5 - 8.1 g/dL 6.7  7.6  6.9   Total Bilirubin 0.3 - 1.2 mg/dL 0.4  0.5  0.5   Alkaline Phos 38 - 126 U/L 68  77  52   AST 15 - 41 U/L _2 ALT 0 - 44 U/L _3 . Lab Results  Component Value Date   LDH 167 12/16/2021         RADIOGRAPHIC STUDIES: I have personally reviewed the radiological images as listed and agreed with the findings in  the report. No results found.  ASSESSMENT & PLAN:   87 y.o. very pleasant Caucasian lady with  #  1 Stage IIIA low-grade follicular lymphoma ( grade 1-2/3) presenting with right upper neck lymphadenopathy. Patient imaging shows that this is at least a stage III A with mesenteric involvement as well. No overt evidence of bone marrow involvement but bone marrow biopsy is not planned for additional staging based on patient preference and the fact that it will not change treatment at this time . LDH level within normal limits FLIPI score - intermediate risk with 5 year OS 78% and ten-year overall survival 51% (from data in the pre-rituxan era) S/p ISRT to cervical LN  Disease. 07/08/17 PET/CT showed slightly increasing activity levels in the mesentery. 02/21/18 CT C/A/P revealed Areas of abnormal, ill-defined soft tissue in the mesentery and along the peritoneal surface identified on previous PET-CT are similar today. No new or progressive interval findings. 2.  Aortic Atherosclerois.  04/02/2019 CT C/A/P([phone]) revealed "Postoperative changes of right middle lobectomy as before. No signs of new adenopathy with stable perienteric and pericolonic soft tissue. Stable lower retroperitoneal soft tissue along the common iliac vasculature. Stable mild pancreatic ductal distension, attention on follow-up Aortic Atherosclerosis (ICD10-I70.0)."  PLAN: -Patient's lab results from today were discussed with her in details.  CBC CMP within normal limits LDH is normal at 167 Patient has no lab or clinical evidence of follicular lymphoma progression at this time. She is aware of constitutional symptoms to monitor and inform us of prior to that. No other acute new focal symptoms We discussed her CT chest, abd, pelvis w contrast done 08/25/2021 which revealed development of multiple b/l pleural-based areas of pulmonary consolidation. New isolated right common iliac adenopathy suspicious for possible recurrent  lymphoma or metastatic disease. -We discussed that the lung findings were likely due to her pneumonia that she had just prior to the CT scans.  Symptoms from this have completely resolved. -No symptomatic progression of lymphoma to consider need for repeat treatment at this time. -We shall get repeat CT chest in 6 weeks to evaluate for resolution of previous lung infiltrates/consolidation. #2 history of lung carcinoid status post surgery in 2008. No concern with recurrence at this time.  FOLLOW UP: CT chest in in 6 weeks Phone visit with Dr Irene Limbo in 7 weeks  All of the patient's questions were answered with apparent satisfaction. The patient knows to call the clinic with any problems, questions or concerns.   Sullivan Lone MD MS AAHIVMS [facility] Hematology/Oncology Physician [facility]  I, Melene Muller, am acting as scribe for Dr. Sullivan Lone, MD.

## 2021-12-17 NOTE — Telephone Encounter (Signed)
Scheduled follow-up appointment per 8/30 los. Patient is aware. 

## 2022-01-11 DIAGNOSIS — E7800 Pure hypercholesterolemia, unspecified: Secondary | ICD-10-CM | POA: Diagnosis not present

## 2022-01-11 DIAGNOSIS — M810 Age-related osteoporosis without current pathological fracture: Secondary | ICD-10-CM | POA: Diagnosis not present

## 2022-01-13 DIAGNOSIS — R030 Elevated blood-pressure reading, without diagnosis of hypertension: Secondary | ICD-10-CM | POA: Diagnosis not present

## 2022-01-13 DIAGNOSIS — C8201 Follicular lymphoma grade I, lymph nodes of head, face, and neck: Secondary | ICD-10-CM | POA: Diagnosis not present

## 2022-01-13 DIAGNOSIS — Z23 Encounter for immunization: Secondary | ICD-10-CM | POA: Diagnosis not present

## 2022-01-13 DIAGNOSIS — M8000XA Age-related osteoporosis with current pathological fracture, unspecified site, initial encounter for fracture: Secondary | ICD-10-CM | POA: Diagnosis not present

## 2022-01-13 DIAGNOSIS — Z902 Acquired absence of lung [part of]: Secondary | ICD-10-CM | POA: Diagnosis not present

## 2022-01-13 DIAGNOSIS — I700 Atherosclerosis of aorta: Secondary | ICD-10-CM | POA: Diagnosis not present

## 2022-01-13 DIAGNOSIS — Z0000 Encounter for general adult medical examination without abnormal findings: Secondary | ICD-10-CM | POA: Diagnosis not present

## 2022-01-13 DIAGNOSIS — Z8639 Personal history of other endocrine, nutritional and metabolic disease: Secondary | ICD-10-CM | POA: Diagnosis not present

## 2022-01-13 DIAGNOSIS — C8203 Follicular lymphoma grade I, intra-abdominal lymph nodes: Secondary | ICD-10-CM | POA: Diagnosis not present

## 2022-01-20 ENCOUNTER — Encounter (HOSPITAL_COMMUNITY): Payer: Self-pay

## 2022-01-20 ENCOUNTER — Ambulatory Visit (HOSPITAL_COMMUNITY)
Admission: RE | Admit: 2022-01-20 | Discharge: 2022-01-20 | Disposition: A | Discharge status: Home / Self Care | Payer: Medicare Other | Source: Ambulatory Visit | Attending: Hematology | Admitting: Hematology

## 2022-01-20 DIAGNOSIS — C8201 Follicular lymphoma grade I, lymph nodes of head, face, and neck: Secondary | ICD-10-CM | POA: Diagnosis not present

## 2022-01-20 DIAGNOSIS — R918 Other nonspecific abnormal finding of lung field: Secondary | ICD-10-CM | POA: Insufficient documentation

## 2022-02-08 ENCOUNTER — Inpatient Hospital Stay: Payer: Medicare Other | Attending: Hematology | Admitting: Hematology

## 2022-02-08 DIAGNOSIS — R918 Other nonspecific abnormal finding of lung field: Secondary | ICD-10-CM | POA: Diagnosis not present

## 2022-02-08 DIAGNOSIS — C8231 Follicular lymphoma grade IIIa, lymph nodes of head, face, and neck: Secondary | ICD-10-CM | POA: Insufficient documentation

## 2022-02-08 NOTE — Progress Notes (Signed)
[name] [name]    HEMATOLOGY/ONCOLOGY PHONE VISIT NOTE  Date of Service: 02/08/22   Patient Care Team: Deland Pretty, MD as PCP - General (Internal Medicine)  CHIEF COMPLAINTS/PURPOSE OF CONSULTATION:   Follow-up for continued management of follicular lymphoma  HISTORY OF PRESENTING ILLNESS:   Please see previous notes for details of initial presentation  INTERVAL HISTORY: I connected with [place] on 02/08/2022 at  3:30 PM EDT by telephone visit and verified that I am speaking with the correct person using two identifiers.  Patient was last seen by me on 12/16/2021 and was doing well.   She reports she has been doing well with any new concerns and new symptoms since last visit. She wants to know the results of her CT scan during this visit.  She denies of any respiratory symptoms or problems since last visit. She denies shortness of breath, congestion, fever, and chills.    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   POLYPECTOMY     2001,2006-all colonic mucosa    SUBMANDIBULAR GLAND EXCISION Right 12/19/2015   Procedure: BIOPSY OF RIGHT SUBMANDIBULAR MASS;  Surgeon: Jerrell Belfast, MD;  Location: [facility] OR;  Service: ENT;  Laterality: Right;    SOCIAL HISTORY: Social History   Socioeconomic History   Marital status: Married    Spouse name: Not on file   Number of children: Not on file   Years of education: Not on file   Highest education level: Not on file  Occupational History   Not on file   Tobacco Use   Smoking status: Not on file   Smokeless tobacco: Never  Vaping Use   Vaping Use: Never used  Substance and Sexual Activity   Alcohol use: No    Alcohol/week: 0.0 standard drinks of alcohol   Drug use: No   Sexual activity: Not on file  Other Topics Concern   Not on file  Social History Narrative   ** Merged History Encounter **       Social Determinants of Health   Financial Resource Strain: Not on file  Food Insecurity: Not on file  Transportation Needs: Not on file  Physical Activity: Not on file  Stress: Not on file  Social Connections: Not on file  Intimate Partner Violence: Not on file    FAMILY HISTORY: Family History  Problem Relation Age of Onset   Colon cancer Neg Hx    Colon polyps Neg Hx    Esophageal cancer Neg Hx    Rectal cancer Neg Hx    Stomach cancer Neg Hx     ALLERGIES:  is allergic to benzonatate and no known allergies.  MEDICATIONS:  Current Outpatient Medications  Medication Sig Dispense Refill   atorvastatin (LIPITOR) 20 MG tablet Take 20 mg by mouth.     BD PEN NEEDLE NANO U/F 32G X 4 MM MISC every other day.      Cholecalciferol (VITAMIN D) 2000 units  CAPS Take 2,000 Units by mouth daily.     COVID-19 mRNA bivalent vaccine, Moderna, (MODERNA COVID-19 BIVAL BOOSTER) 50 MCG/0.5ML injection Inject into the muscle. 0.5 mL 0   COVID-19 mRNA vaccine, Moderna, 100 MCG/0.5ML injection Inject into the muscle. 0.25 mL 0   Multiple Vitamins-Minerals (MULTIVITAMIN WITH MINERALS) tablet Take 1 tablet by mouth.     Omega-3 Fatty Acids (FISH OIL TRIPLE STRENGTH) 1400 MG CAPS Take 1 capsule by mouth daily.     No current facility-administered medications for this visit.    REVIEW OF SYSTEMS: .10 Point review of Systems was done is negative except as noted above.   PHYSICAL EXAMINATION:  Telemedicine visit  LABORATORY DATA:  I have reviewed the data as listed     Latest Ref Rng & Units 12/16/2021    8:37 AM 08/24/2021    8:58 AM  04/29/2021   10:23 AM  CBC  WBC 4.0 - 10.5 K/uL 8.8  6.6  7.5   Hemoglobin 12.0 - 15.0 g/dL 15.0  14.1  13.9   Hematocrit 36.0 - 46.0 % 45.3  44.7  43.2   Platelets 150 - 400 K/uL 202  462  223     .    Latest Ref Rng & Units 12/16/2021    8:37 AM 08/24/2021    8:58 AM 04/29/2021   10:23 AM  CMP  Glucose 70 - 99 mg/dL 85  99  83   BUN 8 - 23 mg/dL '21  15  22   ' Creatinine 0.44 - 1.00 mg/dL 0.89  0.93  0.96   Sodium 135 - 145 mmol/L 142  140  140   Potassium 3.5 - 5.1 mmol/L 4.4  4.8  4.8   Chloride 98 - 111 mmol/L 109  104  106   CO2 22 - 32 mmol/L '29  30  29   ' Calcium 8.9 - 10.3 mg/dL 9.8  9.4  9.8   Total Protein 6.5 - 8.1 g/dL 6.7  7.6  6.9   Total Bilirubin 0.3 - 1.2 mg/dL 0.4  0.5  0.5   Alkaline Phos 38 - 126 U/L 68  77  52   AST 15 - 41 U/L '24  23  21   ' ALT 0 - 44 U/L '18  23  17    ' . Lab Results  Component Value Date   LDH 167 12/16/2021         RADIOGRAPHIC STUDIES: I have personally reviewed the radiological images as listed and agreed with the findings in the report. CT Chest Wo Contrast  Result Date: 01/22/2022 CLINICAL DATA:  87 year old female with history of follicular lymphoma. Follow-up study. * Tracking Code: BO * EXAM: CT CHEST WITHOUT CONTRAST TECHNIQUE: Multidetector CT imaging of the chest was performed following the standard protocol without IV contrast. RADIATION DOSE REDUCTION: This exam was performed according to the departmental dose-optimization program which includes automated exposure control, adjustment of the mA and/or kV according to patient size and/or use of iterative reconstruction technique. COMPARISON:  Chest CT 08/25/2021. FINDINGS: Cardiovascular: Heart size is normal. There is no significant pericardial fluid, thickening or pericardial calcification. There is aortic atherosclerosis, as well as atherosclerosis of the great vessels of the mediastinum and the coronary arteries, including calcified atherosclerotic plaque in the right coronary  artery. Mediastinum/Nodes: No pathologically enlarged mediastinal or hilar lymph nodes. Please note that accurate exclusion of hilar adenopathy is limited on noncontrast CT scans. Esophagus is unremarkable in appearance. No axillary lymphadenopathy. Lungs/Pleura: Status post right  middle lobectomy. Compensatory hyperexpansion of the right upper and lower lobes. When compared to the prior examination the patchy areas of peripheral predominant pleuroparenchymal thickening and architectural distortion have largely regressed compared to the prior study. The best example of this was seen on the prior study on axial image 70 of series 11 of exam 08/25/2021 in the posterolateral left upper lobe, now completely resolved. No new areas of airspace consolidation are noted. However, there is a mixed solid and sub solid lesion in the right lower lobe which has a ground-glass attenuation component measuring 1.5 x 1.4 cm (axial image 87 of series 5) and a solid component measuring 8 mm (axial image 85 of series 5), which has become increasingly apparent compared to the prior study. No pleural effusions. Upper Abdomen: Aortic atherosclerosis. Musculoskeletal: There are no aggressive appearing lytic or blastic lesions noted in the visualized portions of the skeleton. IMPRESSION: 1. Overall, today's study demonstrates a positive response to therapy with regression of nodular and mass-like areas of apparent consolidation seen on the prior examination, most of which have completely resolved. The exception is an enlarging mixed solid and sub solid lesion in the right lower lobe which has a ground-glass attenuation component measuring 1.5 x 1.4 cm and a central solid component measuring 8 mm. The possibility of a slow growing indolent neoplasm such as a primary bronchogenic adenocarcinoma warrants consideration. Given the interval growth in the solid component measuring 8 mm, further evaluation with PET-CT could be considered at this  time. This recommendation follows the consensus statement: Guidelines for Management of Incidental Pulmonary Nodules Detected on CT Images: From the Fleischner Society 2017; Radiology 2017; 284:228-243. 2. Aortic atherosclerosis, in addition to right coronary artery disease. Please note that although the presence of coronary artery calcium documents the presence of coronary artery disease, the severity of this disease and any potential stenosis cannot be assessed on this non-gated CT examination. Assessment for potential risk factor modification, dietary therapy or pharmacologic therapy may be warranted, if clinically indicated. Aortic Atherosclerosis (ICD10-I70.0). Electronically Signed   By: Vinnie Langton M.D.   On: 01/22/2022 07:06    ASSESSMENT & PLAN:   87 y.o. 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 Patient imaging shows that this is at least a stage III A with mesenteric involvement as well. No overt evidence of bone marrow involvement but bone marrow biopsy is not planned for additional staging based on patient preference and the fact that it will not change treatment at this time . LDH level within normal limits FLIPI score - intermediate risk with 5 year OS 78% and ten-year overall survival 51% (from data in the pre-rituxan era) S/p ISRT to cervical LN  Disease. 07/08/17 PET/CT showed slightly increasing activity levels in the mesentery. 02/21/18 CT C/A/P revealed Areas of abnormal, ill-defined soft tissue in the mesentery and along the peritoneal surface identified on previous PET-CT are similar today. No new or progressive interval findings. 2.  Aortic Atherosclerois.  04/02/2019 CT C/A/P([phone]) revealed "Postoperative changes of right middle lobectomy as before. No signs of new adenopathy with stable perienteric and pericolonic soft tissue. Stable lower retroperitoneal soft tissue  along the common iliac vasculature. Stable mild pancreatic ductal distension, attention on follow-up Aortic Atherosclerosis (ICD10-I70.0)."  PLAN: -Patient has no lab or clinical evidence of follicular lymphoma progression at this time. -She is aware of constitutional symptoms to monitor and inform us of prior to that. -  No other acute new focal symptoms. -No symptomatic progression of lymphoma to consider need for repeat treatment at this time. -Discussed CT scan results during today's visit. Most of the pulmonary infiltrates from her pneumonia have resolved but there is still a residual pulmonary that needs to be further characterized.   FOLLOW UP: PET/CT in 7 weeks  Phone visit with Dr Irene Limbo in 8 weeks  The total time spent in the appointment was 10 minutes* .  All of the patient's questions were answered with apparent satisfaction. The patient knows to call the clinic with any problems, questions or concerns.   Zettie Cooley, am acting as a scribe for Sullivan Lone, MD.  Sullivan Lone MD [place] AAHIVMS [facility] Hematology/Oncology Physician [facility]  .*Total Encounter Time as defined by the Centers for Medicare and Medicaid Services includes, in addition to the face-to-face time of a patient visit (documented in the note above) non-face-to-face time: obtaining and reviewing outside history, ordering and reviewing medications, tests or procedures, care coordination (communications with other health care professionals or caregivers) and documentation in the medical record.

## 2022-02-17 DIAGNOSIS — Z23 Encounter for immunization: Secondary | ICD-10-CM | POA: Diagnosis not present

## 2022-02-24 DIAGNOSIS — R030 Elevated blood-pressure reading, without diagnosis of hypertension: Secondary | ICD-10-CM | POA: Diagnosis not present

## 2022-02-24 DIAGNOSIS — M810 Age-related osteoporosis without current pathological fracture: Secondary | ICD-10-CM | POA: Diagnosis not present

## 2022-03-31 ENCOUNTER — Telehealth: Payer: Medicare Other | Admitting: Hematology

## 2022-04-01 ENCOUNTER — Ambulatory Visit (HOSPITAL_COMMUNITY)
Admission: RE | Admit: 2022-04-01 | Discharge: 2022-04-01 | Disposition: A | Discharge status: Home / Self Care | Payer: Medicare Other | Source: Ambulatory Visit | Attending: Hematology | Admitting: Hematology

## 2022-04-01 DIAGNOSIS — R911 Solitary pulmonary nodule: Secondary | ICD-10-CM | POA: Diagnosis not present

## 2022-04-01 DIAGNOSIS — R918 Other nonspecific abnormal finding of lung field: Secondary | ICD-10-CM | POA: Insufficient documentation

## 2022-04-01 LAB — GLUCOSE, CAPILLARY
Glucose-Capillary: 96 mg/dL (ref 70–99)
Glucose-Capillary: 96 mg/dL (ref 70–99)

## 2022-04-01 MED ORDER — FLUDEOXYGLUCOSE F - 18 (FDG) INJECTION
5.4000 | Freq: Once | INTRAVENOUS | Status: AC | PRN
  Administered 2022-04-01: 5.4 via INTRAVENOUS

## 2022-04-13 ENCOUNTER — Telehealth: Payer: Medicare Other | Admitting: Hematology

## 2022-04-16 ENCOUNTER — Inpatient Hospital Stay: Payer: Medicare Other | Attending: Hematology | Admitting: Hematology

## 2022-04-16 ENCOUNTER — Other Ambulatory Visit: Payer: Self-pay

## 2022-04-16 DIAGNOSIS — C8201 Follicular lymphoma grade I, lymph nodes of head, face, and neck: Secondary | ICD-10-CM

## 2022-04-16 DIAGNOSIS — R918 Other nonspecific abnormal finding of lung field: Secondary | ICD-10-CM

## 2022-04-16 DIAGNOSIS — C8231 Follicular lymphoma grade IIIa, lymph nodes of head, face, and neck: Secondary | ICD-10-CM | POA: Insufficient documentation

## 2022-04-22 NOTE — Progress Notes (Signed)
[name] [name]    HEMATOLOGY/ONCOLOGY PHONE VISIT NOTE  Date of Service: .04/16/2022    Patient Care Team: Deland Pretty, MD as PCP - General (Internal Medicine)  CHIEF COMPLAINTS/PURPOSE OF CONSULTATION:   Follow-up for follicular lymphoma and follow-up lung nodule.  Review of PET CT chest abdomen pelvis.  HISTORY OF PRESENTING ILLNESS:   Please see previous notes for details of initial presentation  INTERVAL HISTORY:  I connected with [name] [name] on discharge 04/16/2022  at  8:40 AM EST by telephone visit and verified that I am speaking with the correct person using two identifiers.  Patient notes no acute new symptoms since her last clinic visit.  Had a good Christmas sometimes giving. No fevers no chills no night sweats.  No new lumps or bumps.  No new shortness of breath cough or chest pain.  Patient's PET CT scan results from 04/01/2022 were discussed with in details.  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   POLYPECTOMY     2001,2006-all colonic mucosa    SUBMANDIBULAR GLAND EXCISION Right 12/19/2015   Procedure: BIOPSY OF RIGHT SUBMANDIBULAR MASS;  Surgeon: Jerrell Belfast, MD;  Location: [facility] OR;  Service: ENT;  Laterality: Right;    SOCIAL HISTORY: Social History   Socioeconomic History   Marital status: Married    Spouse name: Not on file   Number of children: Not on file   Years of education: Not on file   Highest education level: Not on file  Occupational History   Not on file  Tobacco  Use   Smoking status: Not on file   Smokeless tobacco: Never  Vaping Use   Vaping Use: Never used  Substance and Sexual Activity   Alcohol use: No    Alcohol/week: 0.0 standard drinks of alcohol   Drug use: No   Sexual activity: Not on file  Other Topics Concern   Not on file  Social History Narrative   ** Merged History Encounter **       Social Determinants of Health   Financial Resource Strain: Not on file  Food Insecurity: Not on file  Transportation Needs: Not on file  Physical Activity: Not on file  Stress: Not on file  Social Connections: Not on file  Intimate Partner Violence: Not on file    FAMILY HISTORY: Family History  Problem Relation Age of Onset   Colon cancer Neg Hx    Colon polyps Neg Hx    Esophageal cancer Neg Hx    Rectal cancer Neg Hx    Stomach cancer Neg Hx     ALLERGIES:  is allergic to benzonatate and no known allergies.  MEDICATIONS:  Current Outpatient Medications  Medication Sig Dispense Refill   atorvastatin (LIPITOR) 20 MG tablet Take 20 mg by mouth.     BD PEN NEEDLE NANO U/F 32G X 4 MM MISC every other day.      Cholecalciferol (VITAMIN D) 2000 units  CAPS Take 2,000 Units by mouth daily.     COVID-19 mRNA bivalent vaccine, Moderna, (MODERNA COVID-19 BIVAL BOOSTER) 50 MCG/0.5ML injection Inject into the muscle. 0.5 mL 0   COVID-19 mRNA vaccine, Moderna, 100 MCG/0.5ML injection Inject into the muscle. 0.25 mL 0   Multiple Vitamins-Minerals (MULTIVITAMIN WITH MINERALS) tablet Take 1 tablet by mouth.     Omega-3 Fatty Acids (FISH OIL TRIPLE STRENGTH) 1400 MG CAPS Take 1 capsule by mouth daily.     No current facility-administered medications for this visit.    REVIEW OF SYSTEMS: 10 Point review of Systems was done is negative except as noted above.   PHYSICAL EXAMINATION:  Telemedicine visit  LABORATORY DATA:  I have reviewed the data as listed     Latest Ref Rng & Units 12/16/2021    8:37 AM 08/24/2021    8:58 AM 04/29/2021    10:23 AM  CBC  WBC 4.0 - 10.5 K/uL 8.8  6.6  7.5   Hemoglobin 12.0 - 15.0 g/dL 15.0  14.1  13.9   Hematocrit 36.0 - 46.0 % 45.3  44.7  43.2   Platelets 150 - 400 K/uL 202  462  223     .    Latest Ref Rng & Units 12/16/2021    8:37 AM 08/24/2021    8:58 AM 04/29/2021   10:23 AM  CMP  Glucose 70 - 99 mg/dL 85  99  83   BUN 8 - 23 mg/dL _0 Creatinine 0.44 - 1.00 mg/dL 0.89  0.93  0.96   Sodium 135 - 145 mmol/L 142  140  140   Potassium 3.5 - 5.1 mmol/L 4.4  4.8  4.8   Chloride 98 - 111 mmol/L 109  104  106   CO2 22 - 32 mmol/L _1 Calcium 8.9 - 10.3 mg/dL 9.8  9.4  9.8   Total Protein 6.5 - 8.1 g/dL 6.7  7.6  6.9   Total Bilirubin 0.3 - 1.2 mg/dL 0.4  0.5  0.5   Alkaline Phos 38 - 126 U/L 68  77  52   AST 15 - 41 U/L _2 ALT 0 - 44 U/L _3 . Lab Results  Component Value Date   LDH 167 12/16/2021         RADIOGRAPHIC STUDIES: I have personally reviewed the radiological images as listed and agreed with the findings in the report. NM PET Image Restag (PS) Skull Base To Thigh  Result Date: 04/02/2022 CLINICAL DATA:  Initial treatment strategy for right lower lobe ground-glass nodule. History of follicular lymphoma. EXAM: NUCLEAR MEDICINE PET SKULL BASE TO THIGH TECHNIQUE: 5.40 mCi F-18 FDG was injected intravenously. Full-ring PET imaging was performed from the skull base to thigh after the radiotracer. CT data was obtained and used for attenuation correction and anatomic localization. Fasting blood glucose: 96 mg/dl COMPARISON:  CT scan 01/20/2022.  Prior PET-CT 07/08/2017 FINDINGS: Mediastinal blood pool activity: SUV max 2.19 Liver activity: SUV max 3.0 NECK: No hypermetabolic lymph nodes in the neck. Incidental CT findings: None. CHEST: No hypermetabolic breast masses. No enlarged or hypermetabolic supraclavicular or axillary nodes. No enlarged or hypermetabolic mediastinal or hilar lymph nodes. 13 mm ground-glass nodule in the right lower  lobe demonstrates mild hypermetabolism with SUV max of 2.03. This is suspicious for adenocarcinoma. Incidental CT findings: Stable surgical changes involving the right lung.  Stable vascular calcifications. ABDOMEN/PELVIS: Extensive hypermetabolic disease in the abdomen 15 mm node anterior to the right renal vein and IVC has an SUV max of 6.90. Cluster of many small lymph nodes in the small bowel mesentery measures approximately 3.5 x 2.5 cm on image 133/4. This is hypermetabolic with SUV max of 9.02. Adjacent right-sided pericolonic nodal disease has an SUV max of 9.75. Ill-defined nodular soft tissue density continues down along the right common iliac artery and into the right pelvis with SUV max of 10.73. Left-sided anterior pelvic nodal disease measures 3.1 x 1.9 cm on image 146/4 and has an SUV max of 5.66. Small left inguinal nodes are mildly hypermetabolic with SUV max of 1.11. Incidental CT findings: Stable vascular disease and left renal calculus. SKELETON: No findings suspicious for osseous lymphoma. Incidental CT findings: None. IMPRESSION: 1. 13 mm right lower lobe ground-glass nodule is mildly hypermetabolic and suspicious for primary lung neoplasm/adenocarcinoma. 2. No hypermetabolic mediastinal or hilar adenopathy. 3. PET-CT findings consistent with recurrent hypermetabolic lymphoma in the abdomen and pelvis as detailed above. Electronically Signed   By: Marijo Sanes M.D.   On: 04/02/2022 13:16    ASSESSMENT & PLAN:   88 y.o. 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 Patient imaging shows that this is at least a stage III A with mesenteric involvement as well. No overt evidence of bone marrow involvement but bone marrow biopsy is not planned for additional staging based on patient preference and the fact that it will not change treatment at this time . LDH level within normal limits FLIPI  score - intermediate risk with 5 year OS 78% and ten-year overall survival 51% (from data in the pre-rituxan era) S/p ISRT to cervical LN  Disease. 07/08/17 PET/CT showed slightly increasing activity levels in the mesentery. 02/21/18 CT C/A/P revealed Areas of abnormal, ill-defined soft tissue in the mesentery and along the peritoneal surface identified on previous PET-CT are similar today. No new or progressive interval findings. 2.  Aortic Atherosclerois.  04/02/2019 CT C/A/P([id number]) revealed "Postoperative changes of right middle lobectomy as before. No signs of new adenopathy with stable perienteric and pericolonic soft tissue. Stable lower retroperitoneal soft tissue along the common iliac vasculature. Stable mild pancreatic ductal distension, attention on follow-up Aortic Atherosclerosis (ICD10-I70.0)."  #2 lower lobe pulmonary nodule mildly hypermetabolic with an SUV of 5.52.  Suspicious for lung primary. PLAN: Patient's PET CT scan from 04/01/2022 large discussed in details with her. She has no new symptoms suggestive of lymphoma progression at this time. PET CT scan shows lymphadenopathy in the abdomen and pelvis without any significant endorgan involvement or clinical symptoms related to these. Patient noted to have 13 mm right lower lobe lung nodule which is only mildly hypermetabolic with an SUV max of 2.03 this does not appear to have changed much in size since her last CT scan in October 2023. -We discussed that the lung nodule could be inflammatory versus lung adenocarcinoma versus follicular lymphoma versus pulmonary carcinoid.  Patient has had a history of previous pulmonary carcinoid that has been resected. -We discussed surveillance versus evaluation by cardiothoracic surgery for consideration of possible biopsy/resection. -Patient is agreeable with having a discussion with cardiothoracic surgery.  Will refer to the Dr. Roxan Hockey We shall repeat CT chest in 11 weeks and follow  back up in 12 weeks unless she decides to have biopsy/resection of the nodule with cardiothoracic surgery. -No clear  indication to initiate treatment for her follicular lymphoma at this time.  FOLLOW UP:  note: Referral to Dr Roxan Hockey for pulmonary nodule ?carcinoid vs lymphoma vs lung cancer CT chest in 11 weeks RTC with Dr Irene Limbo in 12 weeks with labs   The total time spent in the appointment was 21 minutes*.  All of the patient's questions were answered with apparent satisfaction. The patient knows to call the clinic with any problems, questions or concerns.   Sullivan Lone MD [place] AAHIVMS [facility] Hematology/Oncology Physician [facility]  .*Total Encounter Time as defined by the Centers for Medicare and Medicaid Services includes, in addition to the face-to-face time of a patient visit (documented in the note above) non-face-to-face time: obtaining and reviewing outside history, ordering and reviewing medications, tests or procedures, care coordination (communications with other health care professionals or caregivers) and documentation in the medical record.

## 2022-05-03 ENCOUNTER — Encounter: Payer: Self-pay | Admitting: Thoracic Surgery (Cardiothoracic Vascular Surgery)

## 2022-05-03 ENCOUNTER — Institutional Professional Consult (permissible substitution) (INDEPENDENT_AMBULATORY_CARE_PROVIDER_SITE_OTHER): Payer: Medicare Other | Admitting: Thoracic Surgery (Cardiothoracic Vascular Surgery)

## 2022-05-03 VITALS — BP 150/78 | HR 62 | Resp 20 | Ht 61.0 in | Wt 110.0 lb

## 2022-05-03 DIAGNOSIS — C8293 Follicular lymphoma, unspecified, intra-abdominal lymph nodes: Secondary | ICD-10-CM | POA: Insufficient documentation

## 2022-05-03 DIAGNOSIS — Z7982 Long term (current) use of aspirin: Secondary | ICD-10-CM | POA: Insufficient documentation

## 2022-05-03 DIAGNOSIS — Z902 Acquired absence of lung [part of]: Secondary | ICD-10-CM | POA: Insufficient documentation

## 2022-05-03 DIAGNOSIS — R600 Localized edema: Secondary | ICD-10-CM | POA: Insufficient documentation

## 2022-05-03 DIAGNOSIS — Z923 Personal history of irradiation: Secondary | ICD-10-CM | POA: Insufficient documentation

## 2022-05-03 DIAGNOSIS — E041 Nontoxic single thyroid nodule: Secondary | ICD-10-CM | POA: Insufficient documentation

## 2022-05-03 DIAGNOSIS — Z8639 Personal history of other endocrine, nutritional and metabolic disease: Secondary | ICD-10-CM | POA: Insufficient documentation

## 2022-05-03 DIAGNOSIS — I341 Nonrheumatic mitral (valve) prolapse: Secondary | ICD-10-CM | POA: Insufficient documentation

## 2022-05-03 DIAGNOSIS — R3129 Other microscopic hematuria: Secondary | ICD-10-CM | POA: Insufficient documentation

## 2022-05-03 DIAGNOSIS — I700 Atherosclerosis of aorta: Secondary | ICD-10-CM | POA: Insufficient documentation

## 2022-05-03 DIAGNOSIS — M545 Low back pain, unspecified: Secondary | ICD-10-CM | POA: Insufficient documentation

## 2022-05-03 DIAGNOSIS — R911 Solitary pulmonary nodule: Secondary | ICD-10-CM

## 2022-05-03 DIAGNOSIS — Z8781 Personal history of (healed) traumatic fracture: Secondary | ICD-10-CM | POA: Insufficient documentation

## 2022-05-03 DIAGNOSIS — E7800 Pure hypercholesterolemia, unspecified: Secondary | ICD-10-CM | POA: Insufficient documentation

## 2022-05-03 DIAGNOSIS — M4120 Other idiopathic scoliosis, site unspecified: Secondary | ICD-10-CM | POA: Insufficient documentation

## 2022-05-03 DIAGNOSIS — Z86010 Personal history of colonic polyps: Secondary | ICD-10-CM | POA: Insufficient documentation

## 2022-05-03 DIAGNOSIS — Z8619 Personal history of other infectious and parasitic diseases: Secondary | ICD-10-CM | POA: Insufficient documentation

## 2022-05-03 DIAGNOSIS — L821 Other seborrheic keratosis: Secondary | ICD-10-CM | POA: Insufficient documentation

## 2022-05-03 DIAGNOSIS — M810 Age-related osteoporosis without current pathological fracture: Secondary | ICD-10-CM | POA: Insufficient documentation

## 2022-05-03 DIAGNOSIS — R1310 Dysphagia, unspecified: Secondary | ICD-10-CM | POA: Insufficient documentation

## 2022-05-03 NOTE — Progress Notes (Signed)
PCP is Deland Pretty, MD Referring Provider is Brunetta Genera, MD  Chief Complaint  Patient presents with   Lung Lesion    Surgical consult, PET Scan 04/01/22/ Chest CT 01/20/22    HPI: [name] [name] is sent for consultation regarding a right lower lobe lung nodule.  [name] [name] is an 88 year old woman with a history of follicular lymphoma, carcinoid tumor of the lung, right middle lobectomy, hyperlipidemia, cataracts, and osteoporosis.  She is a lifelong non-smoker.  She had a right middle lobectomy by Dr. Arlyce Dice in 2008 for 2 carcinoid tumors.  She was diagnosed with stage IIIa low-grade follicular lymphoma in 4098.  She was treated with radiation (to the neck) and chemotherapy.  In May she had pneumonia.  In October she had a follow-up CT of the chest PET/CT.  Overall there was improvement except for a right lower lobe lung nodule that increased in size and had a slightly larger solid component.  PET/CT in December showed the nodule was mildly hypermetabolic with an SUV of about 2.  She is a lifelong non-smoker.  She feels well.  No chest pain, pressure, tightness, shortness of breath.  She is widowed and lives at [facility] in independent living.  She is very active.  No change in appetite or weight loss.  She is very reluctant to have any invasive procedures such as a biopsy. Past Medical History:  Diagnosis Date   Cancer ([place])    follicular lymphoma   Cataract    History of colon polyps    all colonic mucosa   Hyperlipidemia    on lipitor   Osteoporosis     Past Surgical History:  Procedure Laterality Date   COLONOSCOPY     2001,2006   EYE SURGERY Bilateral    cataract removal   LUNG REMOVAL, PARTIAL Right 05-2006   carcinois tumor 05-2006   POLYPECTOMY     2001,2006-all colonic mucosa    SUBMANDIBULAR GLAND EXCISION Right 12/19/2015   Procedure: BIOPSY OF RIGHT SUBMANDIBULAR MASS;  Surgeon: Jerrell Belfast, MD;  Location: [facility];  Service: ENT;  Laterality: Right;    Family  History  Problem Relation Age of Onset   Colon cancer Neg Hx    Colon polyps Neg Hx    Esophageal cancer Neg Hx    Rectal cancer Neg Hx    Stomach cancer Neg Hx     Social History Social History   Tobacco Use   Smoking status: Never   Smokeless tobacco: Never  Vaping Use   Vaping Use: Never used  Substance Use Topics   Alcohol use: No    Alcohol/week: 0.0 standard drinks of alcohol   Drug use: No    Current Outpatient Medications  Medication Sig Dispense Refill   atorvastatin (LIPITOR) 20 MG tablet Take 20 mg by mouth.     Cholecalciferol (VITAMIN D) 2000 units CAPS Take 2,000 Units by mouth daily.     COVID-19 mRNA bivalent vaccine, Moderna, (MODERNA COVID-19 BIVAL BOOSTER) 50 MCG/0.5ML injection Inject into the muscle. 0.5 mL 0   COVID-19 mRNA vaccine, Moderna, 100 MCG/0.5ML injection Inject into the muscle. 0.25 mL 0   Multiple Vitamins-Minerals (MULTIVITAMIN WITH MINERALS) tablet Take 1 tablet by mouth.     Omega-3 Fatty Acids (FISH OIL TRIPLE STRENGTH) 1400 MG CAPS Take 1 capsule by mouth daily.     BD PEN NEEDLE NANO U/F 32G X 4 MM MISC every other day.      No current facility-administered medications for this visit.  Allergies  Allergen Reactions   Benzonatate     Discoloration and swelling around eyes.   No Known Allergies    Orlistat Other (See Comments)    Review of Systems  Constitutional:  Negative for activity change, appetite change, fever and unexpected weight change.  HENT:  Negative for trouble swallowing and voice change.   Respiratory:  Negative for shortness of breath.   Cardiovascular:  Negative for chest pain and leg swelling.  Neurological:  Negative for seizures and weakness.  Hematological:  Bruises/bleeds easily.    BP (!) 150/78   Pulse 62   Resp 20   Ht 5\' 1"  (1.549 m)   Wt 110 lb (49.9 kg)   SpO2 96% Comment: RA  BMI 20.78 kg/m  Physical Exam Vitals reviewed.  Constitutional:      General: She is not in acute distress.     Appearance: Normal appearance.  HENT:     Head: Normocephalic and atraumatic.  Eyes:     General: No scleral icterus.    Extraocular Movements: Extraocular movements intact.  Cardiovascular:     Rate and Rhythm: Normal rate and regular rhythm.     Heart sounds: Normal heart sounds. No murmur heard.    No friction rub. No gallop.  Pulmonary:     Effort: Pulmonary effort is normal.     Breath sounds: Normal breath sounds. No wheezing.  Abdominal:     General: There is no distension.     Palpations: Abdomen is soft.  Musculoskeletal:     Cervical back: Neck supple.  Lymphadenopathy:     Cervical: No cervical adenopathy.  Skin:    General: Skin is warm and dry.  Neurological:     General: No focal deficit present.     Mental Status: She is alert and oriented to person, place, and time.     Cranial Nerves: No cranial nerve deficit.     Motor: No weakness.    Diagnostic Tests: CT CHEST WITHOUT CONTRAST   TECHNIQUE: Multidetector CT imaging of the chest was performed following the standard protocol without IV contrast.   RADIATION DOSE REDUCTION: This exam was performed according to the departmental dose-optimization program which includes automated exposure control, adjustment of the mA and/or kV according to patient size and/or use of iterative reconstruction technique.   COMPARISON:  Chest CT 08/25/2021.   FINDINGS: Cardiovascular: Heart size is normal. There is no significant pericardial fluid, thickening or pericardial calcification. There is aortic atherosclerosis, as well as atherosclerosis of the great vessels of the mediastinum and the coronary arteries, including calcified atherosclerotic plaque in the right coronary artery.   Mediastinum/Nodes: No pathologically enlarged mediastinal or hilar lymph nodes. Please note that accurate exclusion of hilar adenopathy is limited on noncontrast CT scans. Esophagus is unremarkable in appearance. No axillary  lymphadenopathy.   Lungs/Pleura: Status post right middle lobectomy. Compensatory hyperexpansion of the right upper and lower lobes. When compared to the prior examination the patchy areas of peripheral predominant pleuroparenchymal thickening and architectural distortion have largely regressed compared to the prior study. The best example of this was seen on the prior study on axial image 70 of series 11 of exam 08/25/2021 in the posterolateral left upper lobe, now completely resolved. No new areas of airspace consolidation are noted. However, there is a mixed solid and sub solid lesion in the right lower lobe which has a ground-glass attenuation component measuring 1.5 x 1.4 cm (axial image 87 of series 5) and a solid component measuring  8 mm (axial image 85 of series 5), which has become increasingly apparent compared to the prior study. No pleural effusions.   Upper Abdomen: Aortic atherosclerosis.   Musculoskeletal: There are no aggressive appearing lytic or blastic lesions noted in the visualized portions of the skeleton.   IMPRESSION: 1. Overall, today's study demonstrates a positive response to therapy with regression of nodular and mass-like areas of apparent consolidation seen on the prior examination, most of which have completely resolved. The exception is an enlarging mixed solid and sub solid lesion in the right lower lobe which has a ground-glass attenuation component measuring 1.5 x 1.4 cm and a central solid component measuring 8 mm. The possibility of a slow growing indolent neoplasm such as a primary bronchogenic adenocarcinoma warrants consideration. Given the interval growth in the solid component measuring 8 mm, further evaluation with PET-CT could be considered at this time. This recommendation follows the consensus statement: Guidelines for Management of Incidental Pulmonary Nodules Detected on CT Images: From the Fleischner Society 2017; Radiology  2017; 284:228-243. 2. Aortic atherosclerosis, in addition to right coronary artery disease. Please note that although the presence of coronary artery calcium documents the presence of coronary artery disease, the severity of this disease and any potential stenosis cannot be assessed on this non-gated CT examination. Assessment for potential risk factor modification, dietary therapy or pharmacologic therapy may be warranted, if clinically indicated.   Aortic Atherosclerosis (ICD10-I70.0).     Electronically Signed   By: Trudie Reed M.D.   On: 01/22/2022 07:06 NUCLEAR MEDICINE PET SKULL BASE TO THIGH   TECHNIQUE: 5.40 mCi F-18 FDG was injected intravenously. Full-ring PET imaging was performed from the skull base to thigh after the radiotracer. CT data was obtained and used for attenuation correction and anatomic localization.   Fasting blood glucose: 96 mg/dl   COMPARISON:  CT scan 01/20/2022.  Prior PET-CT 07/08/2017   FINDINGS: Mediastinal blood pool activity: SUV max 2.19   Liver activity: SUV max 3.0   NECK: No hypermetabolic lymph nodes in the neck.   Incidental CT findings: None.   CHEST: No hypermetabolic breast masses. No enlarged or hypermetabolic supraclavicular or axillary nodes. No enlarged or hypermetabolic mediastinal or hilar lymph nodes. 13 mm ground-glass nodule in the right lower lobe demonstrates mild hypermetabolism with SUV max of 2.03. This is suspicious for adenocarcinoma.   Incidental CT findings: Stable surgical changes involving the right lung. Stable vascular calcifications.   ABDOMEN/PELVIS: Extensive hypermetabolic disease in the abdomen 15 mm node anterior to the right renal vein and IVC has an SUV max of 6.90. Cluster of many small lymph nodes in the small bowel mesentery measures approximately 3.5 x 2.5 cm on image 133/4. This is hypermetabolic with SUV max of 6.32. Adjacent right-sided pericolonic nodal disease has an SUV max of  9.75. Ill-defined nodular soft tissue density continues down along the right common iliac artery and into the right pelvis with SUV max of 10.73. Left-sided anterior pelvic nodal disease measures 3.1 x 1.9 cm on image 146/4 and has an SUV max of 5.66. Small left inguinal nodes are mildly hypermetabolic with SUV max of 3.10.   Incidental CT findings: Stable vascular disease and left renal calculus.   SKELETON: No findings suspicious for osseous lymphoma.   Incidental CT findings: None.   IMPRESSION: 1. 13 mm right lower lobe ground-glass nodule is mildly hypermetabolic and suspicious for primary lung neoplasm/adenocarcinoma. 2. No hypermetabolic mediastinal or hilar adenopathy. 3. PET-CT findings consistent with recurrent hypermetabolic  lymphoma in the abdomen and pelvis as detailed above.     Electronically Signed   By: Rudie Meyer M.D.   On: 04/02/2022 13:16  I personally reviewed the CT and PET/CT images.  CT showed an increase in size of the mixed density nodule in the right lower lobe.  About 14 mm overall with an 8 mm solid component.  Some activity PET with SUV of 2.  Also has mesenteric and pelvic nodal disease.  Impression: [name] [name] is an 88 year old woman with a history of follicular lymphoma, carcinoid tumor of the lung, right middle lobectomy, hyperlipidemia, cataracts, and osteoporosis.  Lifelong non-smoker  Right lower lobe lung nodule-increased in size between May and October 2023.  15 mm nodule with about an 8 mm solid component.  On PET/CT has an SUV of about 2.  Any activity is significant in a nodule that size.  Differential diagnosis includes infectious inflammatory process such as BOOP, adenocarcinoma of the lung, lymphoma and recurrent carcinoid tumor.  Radiographic appearance is most consistent with a low-grade adenocarcinoma.  I reviewed the images with her and we discussed options for both diagnosis and treatment.  She is reluctant to have any type of  invasive procedure done.  Even though I informed her that my suspicion was that this was a lung cancer, she still would prefer radiographic follow-up to an invasive biopsy.  We discussed potential treatment options including surgical resection and stereotactic radiation.  She strongly would prefer stereotactic radiation worksheet to require treatment.  She asked if it might be possible to do radiation without a biopsy.  I would defer that to the radiation oncologist.  She was treated by Dr. Kathrynn Running previously.  She is agreeable to continued radiographic follow-up.   Plan: She will follow-up with Dr. Candise Che. I will be happy to see her back if I can assist with her care in any way.  I spent 35 minutes in review of records, images, and in consultation with [name] [name] today. Loreli Slot, MD [facility] [phone]

## 2022-05-17 ENCOUNTER — Inpatient Hospital Stay: Payer: Medicare Other | Attending: Hematology | Admitting: Hematology

## 2022-05-17 DIAGNOSIS — R918 Other nonspecific abnormal finding of lung field: Secondary | ICD-10-CM | POA: Diagnosis not present

## 2022-05-17 DIAGNOSIS — R911 Solitary pulmonary nodule: Secondary | ICD-10-CM | POA: Insufficient documentation

## 2022-05-17 DIAGNOSIS — C8231 Follicular lymphoma grade IIIa, lymph nodes of head, face, and neck: Secondary | ICD-10-CM | POA: Insufficient documentation

## 2022-05-17 NOTE — Progress Notes (Signed)
[name] [name]    HEMATOLOGY/ONCOLOGY PHONE VISIT NOTE  Date of Service: 05/17/22     Patient Care Team: Deland Pretty, MD as PCP - General (Internal Medicine)  CHIEF COMPLAINTS/PURPOSE OF CONSULTATION:   Follow-up for follicular lymphoma and follow-up lung nodule.    HISTORY OF PRESENTING ILLNESS:   Please see previous notes for details of initial presentation  INTERVAL HISTORY: .I connected with [place] on 05/17/2022 at  8:40 AM EST by telephone visit and verified that I am speaking with the correct person using two identifiers.   I discussed the limitations, risks, security and privacy concerns of performing an evaluation and management service by telemedicine and the availability of in-person appointments. I also discussed with the patient that there may be a patient responsible charge related to this service. The patient expressed understanding and agreed to proceed.   Other persons participating in the visit and their role in the encounter: None   Patient's location: Home  Provider's location: [facility]   Chief Complaint: follicular lymphoma and lung nodule.     Patient reports she has been doing well since our last visit. She notes that she recently had appointment with Dr. Roxan Hockey on 05/03/2022. Patient notes that she had future treatment discussion with Dr. Roxan Hockey. She is interested in having an discussion with an radiation oncologist for localized radiation treatment of her presumed lung cancer.    MEDICAL HISTORY:  Past Medical History:  Diagnosis Date   Cancer ([place])    follicular lymphoma   Cataract    History of colon polyps    all colonic mucosa   Hyperlipidemia    on lipitor   Osteoporosis   Lung carcinoid 05/2006 s/p surgery Colonoscopy recently - 1 polyp Osteoporosis - on forteo, Prolia (2 years)  SURGICAL HISTORY: Past Surgical History:  Procedure Laterality Date   COLONOSCOPY     2001,2006   EYE SURGERY Bilateral    cataract removal   LUNG  REMOVAL, PARTIAL Right 05-2006   carcinois tumor 05-2006   POLYPECTOMY     2001,2006-all colonic mucosa    SUBMANDIBULAR GLAND EXCISION Right 12/19/2015   Procedure: BIOPSY OF RIGHT SUBMANDIBULAR MASS;  Surgeon: Jerrell Belfast, MD;  Location: [facility];  Service: ENT;  Laterality: Right;    SOCIAL HISTORY: Social History   Socioeconomic History   Marital status: Married    Spouse name: Not on file   Number of children: Not on file   Years of education: Not on file   Highest education level: Not on file  Occupational History   Not on file  Tobacco Use   Smoking status: Never   Smokeless tobacco: Never  Vaping Use   Vaping Use: Never used  Substance and Sexual Activity   Alcohol use: No    Alcohol/week: 0.0 standard drinks of alcohol   Drug use: No   Sexual activity: Not on file  Other Topics Concern   Not on file  Social History Narrative   ** Merged History Encounter **       Social Determinants of Health   Financial Resource Strain: Not on file  Food Insecurity: Not on file  Transportation Needs: Not on file  Physical Activity: Not on file  Stress: Not on file  Social Connections: Not on file  Intimate Partner Violence: Not on file    FAMILY HISTORY: Family History  Problem Relation Age of Onset   Colon cancer Neg Hx    Colon polyps Neg Hx    Esophageal cancer  Neg Hx    Rectal cancer Neg Hx    Stomach cancer Neg Hx     ALLERGIES:  is allergic to benzonatate, no known allergies, and orlistat.  MEDICATIONS:  Current Outpatient Medications  Medication Sig Dispense Refill   atorvastatin (LIPITOR) 20 MG tablet Take 20 mg by mouth.     BD PEN NEEDLE NANO U/F 32G X 4 MM MISC every other day.      Cholecalciferol (VITAMIN D) 2000 units CAPS Take 2,000 Units by mouth daily.     COVID-19 mRNA bivalent vaccine, Moderna, (MODERNA COVID-19 BIVAL BOOSTER) 50 MCG/0.5ML injection Inject into the muscle. 0.5 mL 0   COVID-19 mRNA vaccine, Moderna, 100 MCG/0.5ML injection  Inject into the muscle. 0.25 mL 0   Multiple Vitamins-Minerals (MULTIVITAMIN WITH MINERALS) tablet Take 1 tablet by mouth.     Omega-3 Fatty Acids (FISH OIL TRIPLE STRENGTH) 1400 MG CAPS Take 1 capsule by mouth daily.     No current facility-administered medications for this visit.    REVIEW OF SYSTEMS: 10 Point review of Systems was done is negative except as noted above.   PHYSICAL EXAMINATION:  Telemedicine visit  LABORATORY DATA:  I have reviewed the data as listed     Latest Ref Rng & Units 12/16/2021    8:37 AM 08/24/2021    8:58 AM 04/29/2021   10:23 AM  CBC  WBC 4.0 - 10.5 K/uL 8.8  6.6  7.5   Hemoglobin 12.0 - 15.0 g/dL 15.0  14.1  13.9   Hematocrit 36.0 - 46.0 % 45.3  44.7  43.2   Platelets 150 - 400 K/uL 202  462  223     .    Latest Ref Rng & Units 12/16/2021    8:37 AM 08/24/2021    8:58 AM 04/29/2021   10:23 AM  CMP  Glucose 70 - 99 mg/dL 85  99  83   BUN 8 - 23 mg/dL 21  15  22    Creatinine 0.44 - 1.00 mg/dL 0.89  0.93  0.96   Sodium 135 - 145 mmol/L 142  140  140   Potassium 3.5 - 5.1 mmol/L 4.4  4.8  4.8   Chloride 98 - 111 mmol/L 109  104  106   CO2 22 - 32 mmol/L 29  30  29    Calcium 8.9 - 10.3 mg/dL 9.8  9.4  9.8   Total Protein 6.5 - 8.1 g/dL 6.7  7.6  6.9   Total Bilirubin 0.3 - 1.2 mg/dL 0.4  0.5  0.5   Alkaline Phos 38 - 126 U/L 68  77  52   AST 15 - 41 U/L 24  23  21    ALT 0 - 44 U/L 18  23  17     . Lab Results  Component Value Date   LDH 167 12/16/2021         RADIOGRAPHIC STUDIES: I have personally reviewed the radiological images as listed and agreed with the findings in the report. No results found.  ASSESSMENT & PLAN:   88 y.o. 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 Patient imaging shows that this is at least a stage III A with mesenteric involvement as well. No overt evidence of bone marrow involvement but bone marrow  biopsy is not planned for additional staging based on patient preference and the fact that it will not change treatment at this time .  LDH level within normal limits FLIPI score - intermediate risk with 5 year OS 78% and ten-year overall survival 51% (from data in the pre-rituxan era) S/p ISRT to cervical LN  Disease. 07/08/17 PET/CT showed slightly increasing activity levels in the mesentery. 02/21/18 CT C/A/P revealed Areas of abnormal, ill-defined soft tissue in the mesentery and along the peritoneal surface identified on previous PET-CT are similar today. No new or progressive interval findings. 2.  Aortic Atherosclerois.  04/02/2019 CT C/A/P([id number]) revealed "Postoperative changes of right middle lobectomy as before. No signs of new adenopathy with stable perienteric and pericolonic soft tissue. Stable lower retroperitoneal soft tissue along the common iliac vasculature. Stable mild pancreatic ductal distension, attention on follow-up Aortic Atherosclerosis (ICD10-I70.0)."  #2 lower lobe pulmonary nodule mildly hypermetabolic with an SUV of 7.32.  Suspicious for lung primary.  PLAN: -discussed recommendation from Dr Roxan Hockey. -Discussed that radiation oncologist could potentially do localized radiation on the lung spot without an biopsy. Patient agrees to have a discussion with Radiation Oncologist.  -Will refer the patient to Radiation oncologist.  -CT scan 11-12 weeks after radiation treatment if patient decides to go with the radiation treatment after discussion with radiation oncologist.   FOLLOW UP: -Referral to Dr Tammi Klippel (Radiation oncology) for consideration of SBRT to lung lesion concerning for primary lung adenocarcinoma -Ct chest 11-12 weeks -RTC with Dr Irene Limbo with labs in 14 weeks  The total time spent in the appointment was 20 minutes* .  All of the patient's questions were answered with apparent satisfaction. The patient knows to call the clinic with any problems,  questions or concerns.   Sullivan Lone MD MS AAHIVMS [facility] Hematology/Oncology Physician [facility]  .*Total Encounter Time as defined by the Centers for Medicare and Medicaid Services includes, in addition to the face-to-face time of a patient visit (documented in the note above) non-face-to-face time: obtaining and reviewing outside history, ordering and reviewing medications, tests or procedures, care coordination (communications with other health care professionals or caregivers) and documentation in the medical record.   I, Cleda Mccreedy, am acting as a Education administrator for Sullivan Lone, MD.  .I have reviewed the above documentation for accuracy and completeness, and I agree with the above. Brunetta Genera MD

## 2022-05-17 NOTE — Progress Notes (Signed)
Radiation Oncology         [phone] ________________________________  Initial outpatient Consultation - Conducted via telephone due to current COVID-19 concerns for limiting patient exposure  Name: [name] [name] MRN: [id number]  Date of Service: 05/18/2022 DOB: 20-May-1934  JK:DTOIZ, Thayer Jew, MD  Brunetta Genera, MD   REFERRING PHYSICIAN: Brunetta Genera, MD  DIAGNOSIS: There were no encounter diagnoses.  No diagnosis found.  HISTORY OF PRESENT ILLNESS: [name] [name] is a 88 y.o. female seen at the request of Dr. [name] She has a history of carcinous lung tumors, s/p excision in 2008, and non-Hodgkin B-cell lymphoma. In summary, her lymphoma was diagnosed in 2017 after she presented with asymptomatic right upper neck swelling/fullness. A neck CT on 12/08/15 showed two soft tissue masses within the right neck. She proceeded to excisional biopsy on 12/19/15; pathology revealed low-grade follicular lymphoma, and cytometry showed findings consistent with a non-Hodgkin B-cell lymphoma of germinal center origin. She was subsequently treated with palliative radiation therapy to the right neck. She has been under surveillance with Dr. Irene Limbo since that time.  More recently, on a routine surveillance CT C/A/P from 08/25/21, she was found to have new bilateral pleural-based areas of pulmonary consolidation. This was felt to be related to her recent pneumonia. When she returned for a repeat chest CT on 01/20/22, the consolidation showed regression to complete resolution, but there was an enlarging lesion in the right lower lobe measuring 1.5 cm. This prompted further evaluation with a PET scan on 04/01/22 showing: mild hypermetabolism to the 13 mm right lower lobe ground-glass nodule. While there were no hypermetabolic mediastinal or hilar adenopathy, there were findings consistent with recurrent hypermetabolic lymphoma in the abdomen and pelvis.  She was seen by Dr. Roxan Hockey on 05/03/22 to discuss  potential biopsy and surgical treatment options. Per his note, she is more interested in proceeding with radiation therapy or radiographic surveillance to avoid any invasive procedures.  PREVIOUS RADIATION THERAPY: Yes  02/17/16 - 03/08/16: The right neck was treated to 30 Gy in 10 fractions   PAST MEDICAL HISTORY:  Past Medical History:  Diagnosis Date   Cancer ([place])    follicular lymphoma   Cataract    History of colon polyps    all colonic mucosa   Hyperlipidemia    on lipitor   Osteoporosis       PAST SURGICAL HISTORY: Past Surgical History:  Procedure Laterality Date   COLONOSCOPY     2001,2006   EYE SURGERY Bilateral    cataract removal   LUNG REMOVAL, PARTIAL Right 05-2006   carcinois tumor 05-2006   POLYPECTOMY     2001,2006-all colonic mucosa    SUBMANDIBULAR GLAND EXCISION Right 12/19/2015   Procedure: BIOPSY OF RIGHT SUBMANDIBULAR MASS;  Surgeon: Jerrell Belfast, MD;  Location: [facility];  Service: ENT;  Laterality: Right;    FAMILY HISTORY:  Family History  Problem Relation Age of Onset   Colon cancer Neg Hx    Colon polyps Neg Hx    Esophageal cancer Neg Hx    Rectal cancer Neg Hx    Stomach cancer Neg Hx     SOCIAL HISTORY:  Social History   Socioeconomic History   Marital status: Married    Spouse name: Not on file   Number of children: Not on file   Years of education: Not on file   Highest education level: Not on file  Occupational History   Not on file  Tobacco Use  Smoking status: Never   Smokeless tobacco: Never  Vaping Use   Vaping Use: Never used  Substance and Sexual Activity   Alcohol use: No    Alcohol/week: 0.0 standard drinks of alcohol   Drug use: No   Sexual activity: Not on file  Other Topics Concern   Not on file  Social History Narrative   ** Merged History Encounter **       Social Determinants of Health   Financial Resource Strain: Not on file  Food Insecurity: Not on file  Transportation Needs: Not on file   Physical Activity: Not on file  Stress: Not on file  Social Connections: Not on file  Intimate Partner Violence: Not on file    ALLERGIES: Benzonatate, No known allergies, and Orlistat  MEDICATIONS:  Current Outpatient Medications  Medication Sig Dispense Refill   atorvastatin (LIPITOR) 20 MG tablet Take 20 mg by mouth.     BD PEN NEEDLE NANO U/F 32G X 4 MM MISC every other day.      Cholecalciferol (VITAMIN D) 2000 units CAPS Take 2,000 Units by mouth daily.     COVID-19 mRNA bivalent vaccine, Moderna, (MODERNA COVID-19 BIVAL BOOSTER) 50 MCG/0.5ML injection Inject into the muscle. 0.5 mL 0   COVID-19 mRNA vaccine, Moderna, 100 MCG/0.5ML injection Inject into the muscle. 0.25 mL 0   Multiple Vitamins-Minerals (MULTIVITAMIN WITH MINERALS) tablet Take 1 tablet by mouth.     Omega-3 Fatty Acids (FISH OIL TRIPLE STRENGTH) 1400 MG CAPS Take 1 capsule by mouth daily.     No current facility-administered medications for this encounter.    REVIEW OF SYSTEMS:  On review of systems, the patient reports that she is doing well overall. She denies any chest pain, shortness of breath, cough, fevers, chills, night sweats, unintended weight changes. She denies any bowel or bladder disturbances, and denies abdominal pain, nausea or vomiting. She denies any new musculoskeletal or joint aches or pains.*** A complete review of systems is obtained and is otherwise negative.    PHYSICAL EXAM:  Wt Readings from Last 3 Encounters:  05/03/22 110 lb (49.9 kg)  12/16/21 111 lb 11.2 oz (50.7 kg)  04/29/21 109 lb 11.2 oz (49.8 kg)   Temp Readings from Last 3 Encounters:  12/16/21 (!) 97.3 F (36.3 C) (Temporal)  04/29/21 (!) 97.3 F (36.3 C) (Temporal)  10/27/20 98.7 F (37.1 C) (Oral)   BP Readings from Last 3 Encounters:  05/03/22 (!) 150/78  12/16/21 (!) 174/70  04/29/21 (!) 147/61   Pulse Readings from Last 3 Encounters:  05/03/22 62  12/16/21 75  04/29/21 66    /10  Physical exam not  performed in light of telephone encounter.   KPS = ***  100 - Normal; no complaints; no evidence of disease. 90   - Able to carry on normal activity; minor signs or symptoms of disease. 80   - Normal activity with effort; some signs or symptoms of disease. 4   - Cares for self; unable to carry on normal activity or to do active work. 60   - Requires occasional assistance, but is able to care for most of his personal needs. 50   - Requires considerable assistance and frequent medical care. 29   - Disabled; requires special care and assistance. 26   - Severely disabled; hospital admission is indicated although death not imminent. 47   - Very sick; hospital admission necessary; active supportive treatment necessary. 10   - Moribund; fatal processes progressing rapidly. 0     -  Dead  Karnofsky DA, Abelmann [place], Craver LS and Burchenal [facility] [phone]) The use of the nitrogen mustards in the palliative treatment of carcinoma: with particular reference to bronchogenic carcinoma Cancer 1 634-56  LABORATORY DATA:  Lab Results  Component Value Date   WBC 8.8 12/16/2021   HGB 15.0 12/16/2021   HCT 45.3 12/16/2021   MCV 89.5 12/16/2021   PLT 202 12/16/2021   Lab Results  Component Value Date   NA 142 12/16/2021   K 4.4 12/16/2021   CL 109 12/16/2021   CO2 29 12/16/2021   Lab Results  Component Value Date   ALT 18 12/16/2021   AST 24 12/16/2021   ALKPHOS 68 12/16/2021   BILITOT 0.4 12/16/2021     RADIOGRAPHY: No results found.    IMPRESSION/PLAN: This visit was conducted via telephone to spare the patient unnecessary potential exposure in the healthcare setting during the current COVID-19 pandemic. 67. 88 y.o. woman with enlarging RLL lung nodule***  Today, we talked to the patient and family about the findings and workup thus far. We discussed the natural history of lung adenocarcinoma and general treatment, highlighting the role of radiotherapy in the management. We discussed the  available radiation techniques, and focused on the details and logistics of delivery. We reviewed the anticipated acute and late sequelae associated with radiation in this setting. The patient was encouraged to ask questions that were answered to her satisfaction.  At the end of our conversation, the patient ***   Given current concerns for patient exposure during the COVID-19 pandemic, this encounter was conducted via telephone. The patient was notified in advance and was offered a [facility] meeting to allow for face to face communication but unfortunately reported that he did not have the appropriate resources/technology to support such a visit and instead preferred to proceed with telephone consult. The patient has given verbal consent for this type of encounter. The time spent during this encounter was *** minutes. The attendants for this meeting include Tyler Pita MD, Ashlyn Bruning PA-C, patient Jadae R Santistevan {and ***.} During the encounter, Tyler Pita MD and Freeman Caldron PA-C were located at [facility].  Patient Miosotis R Wardle {and *** were} was located at home.     Nicholos Johns, PA-C    Tyler Pita, MD  [facility] Direct Dial: [phone]  Fax: [phone] [email or web address]  Skype  [email or web address]   This document serves as a record of services personally performed by Tyler Pita, MD and Freeman Caldron, PA-C. It was created on their behalf by Wilburn Mylar, a trained medical scribe. The creation of this record is based on the scribe's personal observations and the provider's statements to them. This document has been checked and approved by the attending provider.

## 2022-05-18 ENCOUNTER — Encounter: Payer: Self-pay | Admitting: Radiation Oncology

## 2022-05-18 ENCOUNTER — Ambulatory Visit
Admission: RE | Admit: 2022-05-18 | Discharge: 2022-05-18 | Disposition: A | Discharge status: Home / Self Care | Payer: Medicare Other | Source: Ambulatory Visit | Attending: Radiation Oncology | Admitting: Radiation Oncology

## 2022-05-18 ENCOUNTER — Other Ambulatory Visit: Payer: Self-pay

## 2022-05-18 VITALS — Ht 62.0 in | Wt 110.0 lb

## 2022-05-18 DIAGNOSIS — R911 Solitary pulmonary nodule: Secondary | ICD-10-CM

## 2022-05-18 DIAGNOSIS — C3431 Malignant neoplasm of lower lobe, right bronchus or lung: Secondary | ICD-10-CM | POA: Insufficient documentation

## 2022-05-18 NOTE — Progress Notes (Signed)
Thoracic Location of Tumor / Histology: Lung mass right lower lobe  04/01/2022 Dr. Irene Limbo  NM PET Image Restag (PS) Skull Base to Thigh CLINICAL DATA:  Initial treatment strategy for right lower lobe ground-glass nodule. History of follicular lymphoma  FINDINGS: Mediastinal blood pool activity: SUV max 2.19.   Liver activity: SUV max 3.0   NECK: No hypermetabolic lymph nodes in the neck.   Incidental CT findings: None.   CHEST: No hypermetabolic breast masses. No enlarged or hypermetabolic supraclavicular or axillary nodes. No enlarged or hypermetabolic mediastinal or hilar lymph nodes. 13 mm ground-glass nodule in the right lower lobe demonstrates mild hypermetabolism with SUV max of 2.03. This is suspicious for adenocarcinoma.   Incidental CT findings: Stable surgical changes involving the right lung. Stable vascular calcifications.   ABDOMEN/PELVIS: Extensive hypermetabolic disease in the abdomen 15 mm node anterior to the right renal vein and IVC has an SUV max of 6.90. Cluster of many small lymph nodes in the small bowel mesentery measures approximately 3.5 x 2.5 cm on image 133/4. This is hypermetabolic with SUV max of 7.06. Adjacent right-sided pericolonic nodal disease has an SUV max of 9.75. Ill-defined nodular soft tissue density continues down along the right common iliac artery and into the right pelvis with SUV max of 10.73.  Left-sided anterior pelvic nodal disease measures 3.1 x 1.9 cm on image 146/4 and has an SUV max of 5.66. Small left inguinal nodes are mildly hypermetabolic with SUV max of 2.37.   Incidental CT findings: Stable vascular disease and left renal calculus.   SKELETON: No findings suspicious for osseous lymphoma.   Incidental CT findings: None.   IMPRESSION: 1. 13 mm right lower lobe ground-glass nodule is mildly hypermetabolic and suspicious for primary lung neoplasm/adenocarcinoma. 2. No hypermetabolic mediastinal or hilar adenopathy. 3. PET-CT  findings consistent with recurrent hypermetabolic lymphoma in the abdomen and pelvis as detailed above.   CT Chest without Contrast 01/20/2022 Dr. Irene Limbo CLINICAL DATA:  88 year old female with history of follicular lymphoma. Follow-up study. * Tracking Code: BO *  FINDINGS: Cardiovascular: Heart size is normal. There is no significant pericardial fluid, thickening or pericardial calcification. There is aortic atherosclerosis, as well as atherosclerosis of the great vessels of the mediastinum and the coronary arteries, including calcified atherosclerotic plaque in the right coronary artery.   Mediastinum/Nodes: No pathologically enlarged mediastinal or hilar lymph nodes. Please note that accurate exclusion of hilar adenopathy is limited on noncontrast CT scans. Esophagus is unremarkable in appearance. No axillary lymphadenopathy.   Lungs/Pleura: Status post right middle lobectomy. Compensatory hyperexpansion of the right upper and lower lobes. When compared to the prior examination the patchy areas of peripheral predominant pleuroparenchymal thickening and architectural distortion have largely regressed compared to the prior study. The best example of this was seen on the prior study on axial image 70 of series 11 of exam 08/25/2021 in the posterolateral left upper lobe, now completely resolved. No new areas of airspace consolidation are noted. However, there is a mixed solid and sub solid lesion in the right lower lobe which has a ground-glass attenuation component measuring 1.5 x 1.4 cm (axial image 87 of series 5) and a solid component measuring 8 mm (axial image 85 of series 5), which has become increasingly apparent compared to the prior study. No pleural effusions.   Upper Abdomen: Aortic atherosclerosis.   Musculoskeletal: There are no aggressive appearing lytic or blastic lesions noted in the visualized portions of the skeleton.   IMPRESSION: 1. Overall, today's  study demonstrates a  positive response to therapy with regression of nodular and mass-like areas of apparent consolidation seen on the prior examination, most of which have completely resolved. The exception is an enlarging mixed solid and sub solid lesion in the right lower lobe which has a ground-glass attenuation component measuring 1.5 x 1.4 cm and a central solid component measuring 8 mm. The possibility of a slow growing indolent neoplasm such as a primary bronchogenic adenocarcinoma warrants consideration. Given the interval growth in the solid component measuring 8 mm, further evaluation with PET-CT could be considered at this time. This recommendation follows the consensus statement:  Guidelines for Management of Incidental Pulmonary Nodules Detected on CT Images: From the Fleischner Society 2017; Radiology 2017; 284:228-243. 2. Aortic atherosclerosis, in addition to right coronary artery disease. Please note that although the presence of coronary artery calcium documents the presence of coronary artery disease, the severity of this disease and any potential stenosis cannot be assessed on this non-gated CT examination. Assessment for potential risk factor modification, dietary therapy or pharmacologic therapy may be warranted, if clinically indicated. Aortic Atherosclerosis (ICD10-I70.0).  CT Chest Abdomen Pelvis with Contrast Dr. Irene Limbo 08/25/2021 CLINICAL DATA:  History of follicular lymphoma diagnosed in 2017.  Radiation therapy complete. History of carcinoid tumor diagnosed in 2008. * Tracking Code: BO *   FINDINGS: CT CHEST FINDINGS   Cardiovascular: Aortic atherosclerosis. Mild cardiomegaly, without pericardial effusion. No central pulmonary embolism, on this non-dedicated study.   Mediastinum/Nodes: No axillary adenopathy. No mediastinal or hilar adenopathy. Small hiatal hernia. Tiny bilateral thyroid nodules. Not clinically significant; no follow-up imaging recommended (ref: J Am Coll Radiol. 2015  Feb;12(2): 143-50)   Lungs/Pleura: No pleural fluid.  Status post right middle lobectomy.   Development of multiple, bilateral areas of primarily pleural-based consolidation. These are identified on series 11. A more irregular medial right lower lobe area of probable consolidation on 72/11 measures on the order of 9 mm.   Minimal clustered "tree-in-bud" nodularity within the peribronchovascular left upper lobe on 55/11 is new.   Musculoskeletal: No acute osseous abnormality.   CT ABDOMEN PELVIS FINDINGS Hepatobiliary: Normal liver. Normal gallbladder, without biliary ductal dilatation.   Pancreas: Upper normal pancreatic duct caliber, similar. No acute inflammation.   Spleen: Normal in size, without focal abnormality.   Adrenals/Urinary Tract: Normal adrenal glands. Punctate lower pole left renal collecting system calculus. Subcentimeter interpolar right renal cyst. No  hydronephrosis. Normal urinary bladder.   Stomach/Bowel: Proximal gastric underdistention. The cecum is redundant, terminating in the midline of the upper pelvis. Normal small bowel caliber.   Vascular/Lymphatic: Advanced aortic and branch vessel atherosclerosis. Subcentimeter pre caval nodes are similar and not pathologic by size criteria.   Multiple small jejunal mesenteric nodes are not significantly changed.   Soft tissue thickening involving the caudal most portion of the ileocolic mesentery including at 5.7 x 1.9 cm on 139/7. 6.0 x 2.8 cm on the prior exam, suggesting stability.   Soft tissue thickening inferolateral to the cecum measures 2.9 x 1.9 cm on 141/7 versus 3.9 x 1.8 cm on the prior exam.   1.0 cm node adjacent the right common iliac artery at 135/7 measured 9 mm on the prior.   A node anterior to the right common iliac vein measures 11 mm on 137/7 and is significantly enlarged from 3-4 mm on the prior.   Reproductive: Dystrophic calcifications within the uterus. No adnexal mass.   Other: Trace  fluid within the pelvic cul-de-sac is new on 170/7.  No evidence of omental or peritoneal disease.   Musculoskeletal: S shaped thoracolumbar spine curvature.   IMPRESSION: 1. Development of multiple bilateral primarily pleural-based areas of pulmonary consolidation. Differential considerations include infection, recurrent lymphoma, or less likely metastasis from patient's carcinoid tumor. If there are infectious symptoms, consider antibiotic therapy and CT follow-up at 6-8 weeks. If not, consider further evaluation with PET. 2. Primarily similar appearance of areas of soft tissue thickening within the small bowel mesentery and pericolonic fat. These have an appearance which favors treated lymphoma. There is however, new isolated right common iliac adenopathy, suspicious for recurrent lymphoma or metastatic disease. 3. Status post right middle lobectomy. 4. Left nephrolithiasis 5. New trace pelvic fluid, nonspecific.    Tobacco/Marijuana/Snuff/ETOH use: Lifelong non-smoker. No drug or alcohol use.  Past/Anticipated interventions by cardiothoracic surgery, if any:   05/03/2022 Dr. Modesto Charon Lung Lesion I reviewed the images with her and we discussed options for both diagnosis and treatment.  She is reluctant to have any type of invasive procedure done.  Even though I informed her that my suspicion was that this was a lung cancer, she still would prefer radiographic follow-up to an invasive biopsy.   We discussed potential treatment options including surgical resection and stereotactic radiation.  She strongly would prefer stereotactic radiation worksheet to require treatment.  She asked if it might be possible to do radiation without a biopsy.  I would defer that to the radiation oncologist.  She was treated by Dr. Tammi Klippel previously.  Past/Anticipated interventions by medical oncology, if any:   Signs/Symptoms Weight changes, if any:  No Respiratory complaints, if any:   No Hemoptysis, if any: No Pain issues, if any:  0/10  SAFETY ISSUES: Prior radiation? Yes,  Radiation to neck and chemotherapy.  Stage IIIa low-grade follicular lymphoma in 8937. Pacemaker/ICD? No  Possible current pregnancy? Postmenopausal Is the patient on methotrexate? No  Current Complaints / other details:  Right middle lobectomy by Dr. Arlyce Dice (2008) for 2 carcinoid tumors.

## 2022-05-20 NOTE — Progress Notes (Signed)
  Radiation Oncology         [phone] ________________________________  Name: [name] [name] MRN: [id number]  Date: 05/21/2022  DOB: 13-Mar-1935  STEREOTACTIC BODY RADIOTHERAPY SIMULATION AND TREATMENT PLANNING NOTE    ICD-10-CM   1. Primary cancer of right lower lobe of lung (HCC)  C34.31       DIAGNOSIS:  88 yo woman with a 1.5 cm putative non-small cell carcinoma of the right lower lung - Stage IA2   NARRATIVE:  The patient was brought to the [facility].  Identity was confirmed.  All relevant records and images related to the planned course of therapy were reviewed.  The patient freely provided informed written consent to proceed with treatment after reviewing the details related to the planned course of therapy. The consent form was witnessed and verified by the simulation staff.  Then, the patient was set-up in a stable reproducible  supine position for radiation therapy.  A BodyFix immobilization pillow was fabricated for reproducible positioning.  Then I personally applied the abdominal compression paddle to limit respiratory excursion.  4D respiratoy motion management CT images were obtained.  Surface markings were placed.  The CT images were loaded into the planning software.  Then, using Cine, MIP, and standard views, the internal target volume (ITV) and planning target volumes (PTV) were delinieated, and avoidance structures were contoured.  Treatment planning then occurred.  The radiation prescription was entered and confirmed.  A total of two complex treatment devices were fabricated in the form of the BodyFix immobilization pillow and a neck accuform cushion.  I have requested : 3D Simulation  I have requested a DVH of the following structures: Heart, Lungs, Esophagus, Chest Wall, Brachial Plexus, Major Blood Vessels, and targets.  SPECIAL TREATMENT PROCEDURE:  The planned course of therapy using radiation constitutes a special treatment procedure. Special care is  required in the management of this patient for the following reasons. This treatment constitutes a Special Treatment Procedure for the following reason: [ High dose per fraction requiring special monitoring for increased toxicities of treatment including daily imaging..  The special nature of the planned course of radiotherapy will require increased physician supervision and oversight to ensure patient's safety with optimal treatment outcomes.  This requires extended time and effort.    RESPIRATORY MOTION MANAGEMENT SIMULATION:  In order to account for effect of respiratory motion on target structures and other organs in the planning and delivery of radiotherapy, this patient underwent respiratory motion management simulation.  To accomplish this, when the patient was brought to the CT simulation planning suite, 4D respiratoy motion management CT images were obtained.  The CT images were loaded into the planning software.  Then, using a variety of tools including Cine, MIP, and standard views, the target volume and planning target volumes (PTV) were delineated.  Avoidance structures were contoured.  Treatment planning then occurred.  Dose volume histograms were generated and reviewed for each of the requested structure.  The resulting plan was carefully reviewed and approved today.  PLAN:  The patient will receive 54*** Gy in 3 fractions.  ________________________________  Sheral Apley Tammi Klippel, M.D.

## 2022-05-21 ENCOUNTER — Ambulatory Visit
Admission: RE | Admit: 2022-05-21 | Discharge: 2022-05-21 | Disposition: A | Discharge status: Home / Self Care | Payer: Medicare Other | Source: Ambulatory Visit | Attending: Radiation Oncology | Admitting: Radiation Oncology

## 2022-05-21 DIAGNOSIS — Z510 Encounter for antineoplastic radiation therapy: Secondary | ICD-10-CM | POA: Diagnosis not present

## 2022-05-21 DIAGNOSIS — C3431 Malignant neoplasm of lower lobe, right bronchus or lung: Secondary | ICD-10-CM | POA: Insufficient documentation

## 2022-05-24 DIAGNOSIS — C3431 Malignant neoplasm of lower lobe, right bronchus or lung: Secondary | ICD-10-CM | POA: Diagnosis not present

## 2022-05-24 DIAGNOSIS — Z510 Encounter for antineoplastic radiation therapy: Secondary | ICD-10-CM | POA: Diagnosis not present

## 2022-06-01 ENCOUNTER — Other Ambulatory Visit: Payer: Self-pay

## 2022-06-01 ENCOUNTER — Ambulatory Visit
Admission: RE | Admit: 2022-06-01 | Discharge: 2022-06-01 | Disposition: A | Discharge status: Home / Self Care | Payer: Medicare Other | Source: Ambulatory Visit | Attending: Radiation Oncology | Admitting: Radiation Oncology

## 2022-06-01 DIAGNOSIS — C3431 Malignant neoplasm of lower lobe, right bronchus or lung: Secondary | ICD-10-CM

## 2022-06-01 DIAGNOSIS — Z510 Encounter for antineoplastic radiation therapy: Secondary | ICD-10-CM | POA: Diagnosis not present

## 2022-06-01 LAB — RAD ONC ARIA SESSION SUMMARY
Course Elapsed Days: 0
Plan Fractions Treated to Date: 1
Plan Prescribed Dose Per Fraction: 18 Gy
Plan Total Fractions Prescribed: 3
Plan Total Prescribed Dose: 54 Gy
Reference Point Dosage Given to Date: 18 Gy
Reference Point Session Dosage Given: 18 Gy
Session Number: 1

## 2022-06-02 ENCOUNTER — Other Ambulatory Visit (HOSPITAL_BASED_OUTPATIENT_CLINIC_OR_DEPARTMENT_OTHER): Payer: Self-pay | Admitting: Internal Medicine

## 2022-06-02 DIAGNOSIS — Z1231 Encounter for screening mammogram for malignant neoplasm of breast: Secondary | ICD-10-CM

## 2022-06-03 ENCOUNTER — Other Ambulatory Visit: Payer: Self-pay

## 2022-06-03 ENCOUNTER — Ambulatory Visit
Admission: RE | Admit: 2022-06-03 | Discharge: 2022-06-03 | Disposition: A | Discharge status: Home / Self Care | Payer: Medicare Other | Source: Ambulatory Visit | Attending: Radiation Oncology | Admitting: Radiation Oncology

## 2022-06-03 DIAGNOSIS — C3431 Malignant neoplasm of lower lobe, right bronchus or lung: Secondary | ICD-10-CM

## 2022-06-03 DIAGNOSIS — Z510 Encounter for antineoplastic radiation therapy: Secondary | ICD-10-CM | POA: Diagnosis not present

## 2022-06-03 LAB — RAD ONC ARIA SESSION SUMMARY
Course Elapsed Days: 2
Plan Fractions Treated to Date: 2
Plan Prescribed Dose Per Fraction: 18 Gy
Plan Total Fractions Prescribed: 3
Plan Total Prescribed Dose: 54 Gy
Reference Point Dosage Given to Date: 36 Gy
Reference Point Session Dosage Given: 18 Gy
Session Number: 2

## 2022-06-08 ENCOUNTER — Other Ambulatory Visit: Payer: Self-pay

## 2022-06-08 ENCOUNTER — Encounter: Payer: Self-pay | Admitting: Urology

## 2022-06-08 ENCOUNTER — Ambulatory Visit
Admission: RE | Admit: 2022-06-08 | Discharge: 2022-06-08 | Disposition: A | Discharge status: Home / Self Care | Payer: Medicare Other | Source: Ambulatory Visit | Attending: Radiation Oncology | Admitting: Radiation Oncology

## 2022-06-08 DIAGNOSIS — R918 Other nonspecific abnormal finding of lung field: Secondary | ICD-10-CM

## 2022-06-08 DIAGNOSIS — C3431 Malignant neoplasm of lower lobe, right bronchus or lung: Secondary | ICD-10-CM

## 2022-06-08 DIAGNOSIS — Z510 Encounter for antineoplastic radiation therapy: Secondary | ICD-10-CM | POA: Diagnosis not present

## 2022-06-08 LAB — RAD ONC ARIA SESSION SUMMARY
Course Elapsed Days: 7
Plan Fractions Treated to Date: 3
Plan Prescribed Dose Per Fraction: 18 Gy
Plan Total Fractions Prescribed: 3
Plan Total Prescribed Dose: 54 Gy
Reference Point Dosage Given to Date: 54 Gy
Reference Point Session Dosage Given: 18 Gy
Session Number: 3

## 2022-06-11 DIAGNOSIS — M810 Age-related osteoporosis without current pathological fracture: Secondary | ICD-10-CM | POA: Diagnosis not present

## 2022-06-17 NOTE — Progress Notes (Signed)
                                                                                                                                                             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  Plan: The patient will receive a call in about one month from the radiation oncology department. She will continue follow up with her medical oncologist, Dr. Irene Limbo as well.  ------------------------------------------------   Tyler Pita, MD [place]: [phone]  Fax: [phone] [email or web address]  Skype  LinkedIn

## 2022-07-02 ENCOUNTER — Ambulatory Visit (HOSPITAL_COMMUNITY): Payer: Medicare Other

## 2022-07-06 ENCOUNTER — Ambulatory Visit
Admission: RE | Admit: 2022-07-06 | Discharge: 2022-07-06 | Disposition: A | Discharge status: Home / Self Care | Payer: Medicare Other | Source: Ambulatory Visit | Attending: Hematology | Admitting: Hematology

## 2022-07-06 DIAGNOSIS — Z510 Encounter for antineoplastic radiation therapy: Secondary | ICD-10-CM | POA: Insufficient documentation

## 2022-07-06 DIAGNOSIS — C3431 Malignant neoplasm of lower lobe, right bronchus or lung: Secondary | ICD-10-CM | POA: Insufficient documentation

## 2022-07-06 NOTE — Progress Notes (Signed)
  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   Symptoms of fatigue have improved since completing therapy.  Symptoms of skin changes have improved since completing therapy.  Symptoms of esophagitis have improved since completing therapy.   The patient has scheduled follow up with her medical oncologist Dr. Irene Limbo  for ongoing care, and was encouraged to call if she develops concerns or questions regarding radiation.  This concludes the interview.   Leandra Kern, LPN

## 2022-07-09 ENCOUNTER — Ambulatory Visit: Payer: Medicare Other | Admitting: Hematology

## 2022-07-09 ENCOUNTER — Other Ambulatory Visit: Payer: Medicare Other

## 2022-07-12 DIAGNOSIS — H1789 Other corneal scars and opacities: Secondary | ICD-10-CM | POA: Diagnosis not present

## 2022-07-12 DIAGNOSIS — Z961 Presence of intraocular lens: Secondary | ICD-10-CM | POA: Diagnosis not present

## 2022-07-12 DIAGNOSIS — H353131 Nonexudative age-related macular degeneration, bilateral, early dry stage: Secondary | ICD-10-CM | POA: Diagnosis not present

## 2022-07-26 DIAGNOSIS — L821 Other seborrheic keratosis: Secondary | ICD-10-CM | POA: Diagnosis not present

## 2022-07-26 DIAGNOSIS — L812 Freckles: Secondary | ICD-10-CM | POA: Diagnosis not present

## 2022-07-26 DIAGNOSIS — Z85828 Personal history of other malignant neoplasm of skin: Secondary | ICD-10-CM | POA: Diagnosis not present

## 2022-07-26 DIAGNOSIS — D1801 Hemangioma of skin and subcutaneous tissue: Secondary | ICD-10-CM | POA: Diagnosis not present

## 2022-07-26 DIAGNOSIS — D1721 Benign lipomatous neoplasm of skin and subcutaneous tissue of right arm: Secondary | ICD-10-CM | POA: Diagnosis not present

## 2022-07-26 DIAGNOSIS — L578 Other skin changes due to chronic exposure to nonionizing radiation: Secondary | ICD-10-CM | POA: Diagnosis not present

## 2022-07-28 ENCOUNTER — Ambulatory Visit (HOSPITAL_BASED_OUTPATIENT_CLINIC_OR_DEPARTMENT_OTHER): Payer: Medicare Other | Admitting: Radiology

## 2022-07-30 ENCOUNTER — Encounter (HOSPITAL_BASED_OUTPATIENT_CLINIC_OR_DEPARTMENT_OTHER): Payer: Self-pay | Admitting: Internal Medicine

## 2022-07-30 ENCOUNTER — Ambulatory Visit (HOSPITAL_BASED_OUTPATIENT_CLINIC_OR_DEPARTMENT_OTHER)
Admission: RE | Admit: 2022-07-30 | Discharge: 2022-07-30 | Disposition: A | Discharge status: Home / Self Care | Payer: Medicare Other | Source: Ambulatory Visit | Attending: Internal Medicine | Admitting: Internal Medicine

## 2022-07-30 ENCOUNTER — Other Ambulatory Visit (HOSPITAL_BASED_OUTPATIENT_CLINIC_OR_DEPARTMENT_OTHER): Payer: Self-pay | Admitting: Internal Medicine

## 2022-07-30 DIAGNOSIS — Z1231 Encounter for screening mammogram for malignant neoplasm of breast: Secondary | ICD-10-CM | POA: Diagnosis not present

## 2022-07-30 DIAGNOSIS — Z1211 Encounter for screening for malignant neoplasm of colon: Secondary | ICD-10-CM

## 2022-08-12 DIAGNOSIS — Z23 Encounter for immunization: Secondary | ICD-10-CM | POA: Diagnosis not present

## 2022-08-24 ENCOUNTER — Other Ambulatory Visit: Payer: Medicare Other

## 2022-08-24 ENCOUNTER — Ambulatory Visit: Payer: Medicare Other | Admitting: Hematology

## 2022-08-26 ENCOUNTER — Other Ambulatory Visit: Payer: Self-pay

## 2022-08-26 DIAGNOSIS — C8201 Follicular lymphoma grade I, lymph nodes of head, face, and neck: Secondary | ICD-10-CM

## 2022-09-02 ENCOUNTER — Ambulatory Visit (HOSPITAL_COMMUNITY)
Admission: RE | Admit: 2022-09-02 | Discharge: 2022-09-02 | Disposition: A | Discharge status: Home / Self Care | Payer: Medicare Other | Source: Ambulatory Visit | Attending: Hematology | Admitting: Hematology

## 2022-09-02 DIAGNOSIS — R918 Other nonspecific abnormal finding of lung field: Secondary | ICD-10-CM | POA: Diagnosis not present

## 2022-09-02 DIAGNOSIS — R911 Solitary pulmonary nodule: Secondary | ICD-10-CM | POA: Diagnosis not present

## 2022-09-02 DIAGNOSIS — J479 Bronchiectasis, uncomplicated: Secondary | ICD-10-CM | POA: Diagnosis not present

## 2022-09-03 ENCOUNTER — Other Ambulatory Visit: Payer: Self-pay

## 2022-09-03 ENCOUNTER — Inpatient Hospital Stay (HOSPITAL_BASED_OUTPATIENT_CLINIC_OR_DEPARTMENT_OTHER): Payer: Medicare Other | Admitting: Hematology

## 2022-09-03 ENCOUNTER — Inpatient Hospital Stay: Payer: Medicare Other | Attending: Hematology

## 2022-09-03 VITALS — BP 158/62 | HR 76 | Temp 98.1°F | Resp 15 | Wt 115.7 lb

## 2022-09-03 DIAGNOSIS — C8201 Follicular lymphoma grade I, lymph nodes of head, face, and neck: Secondary | ICD-10-CM

## 2022-09-03 DIAGNOSIS — R918 Other nonspecific abnormal finding of lung field: Secondary | ICD-10-CM

## 2022-09-03 DIAGNOSIS — R911 Solitary pulmonary nodule: Secondary | ICD-10-CM | POA: Diagnosis not present

## 2022-09-03 DIAGNOSIS — C8238 Follicular lymphoma grade IIIa, lymph nodes of multiple sites: Secondary | ICD-10-CM | POA: Diagnosis not present

## 2022-09-03 LAB — CBC WITH DIFFERENTIAL (CANCER CENTER ONLY)
Abs Immature Granulocytes: 0.01 10*3/uL (ref 0.00–0.07)
Basophils Absolute: 0.1 10*3/uL (ref 0.0–0.1)
Basophils Relative: 1 %
Eosinophils Absolute: 0.2 10*3/uL (ref 0.0–0.5)
Eosinophils Relative: 2 %
HCT: 44.9 % (ref 36.0–46.0)
Hemoglobin: 14.7 g/dL (ref 12.0–15.0)
Immature Granulocytes: 0 %
Lymphocytes Relative: 24 %
Lymphs Abs: 2 10*3/uL (ref 0.7–4.0)
MCH: 30.1 pg (ref 26.0–34.0)
MCHC: 32.7 g/dL (ref 30.0–36.0)
MCV: 92 fL (ref 80.0–100.0)
Monocytes Absolute: 0.7 10*3/uL (ref 0.1–1.0)
Monocytes Relative: 8 %
Neutro Abs: 5.5 10*3/uL (ref 1.7–7.7)
Neutrophils Relative %: 65 %
Platelet Count: 214 10*3/uL (ref 150–400)
RBC: 4.88 MIL/uL (ref 3.87–5.11)
RDW: 14.3 % (ref 11.5–15.5)
WBC Count: 8.4 10*3/uL (ref 4.0–10.5)
nRBC: 0 % (ref 0.0–0.2)

## 2022-09-03 LAB — CMP (CANCER CENTER ONLY)
ALT: 20 U/L (ref 0–44)
AST: 26 U/L (ref 15–41)
Albumin: 4.4 g/dL (ref 3.5–5.0)
Alkaline Phosphatase: 62 U/L (ref 38–126)
Anion gap: 6 (ref 5–15)
BUN: 18 mg/dL (ref 8–23)
CO2: 30 mmol/L (ref 22–32)
Calcium: 10.1 mg/dL (ref 8.9–10.3)
Chloride: 105 mmol/L (ref 98–111)
Creatinine: 0.87 mg/dL (ref 0.44–1.00)
GFR, Estimated: >60 mL/min (ref >60)
Glucose, Bld: 82 mg/dL (ref 70–99)
Potassium: 4.9 mmol/L (ref 3.5–5.1)
Sodium: 141 mmol/L (ref 135–145)
Total Bilirubin: 0.6 mg/dL (ref 0.3–1.2)
Total Protein: 7 g/dL (ref 6.5–8.1)

## 2022-09-03 LAB — LACTATE DEHYDROGENASE: LDH: 177 U/L (ref 98–192)

## 2022-09-03 NOTE — Progress Notes (Signed)
HEMATOLOGY/ONCOLOGY CLINIC VISIT NOTE  Date of Service: 09/03/22    Patient Care Team: Merri Brunette, MD as PCP - General (Internal Medicine)  CHIEF COMPLAINTS/PURPOSE OF CONSULTATION:   Follow-up for follicular lymphoma and follow-up lung nodule.    HISTORY OF PRESENTING ILLNESS:   Please see previous notes for details of initial presentation  INTERVAL HISTORY:  [name] [name] is a 88 y.o. female here for continued evaluation and management of follicular lymphoma and lung nodule. Patient was last seen by me on 05/17/2022 and was doing well overall.   Today, she reports that her radiation therapy went well and was not bothersome. She denies any following chest pain or changed SOB compared to her baseline.  She does complain of a mild lump in her throat and occasional LE edema.   Patient reports endorsing a normal p.o. intake and normal sleeping habits. She denies any sudden weight loss, abdominal pain, change in bowel habits, diarrhea, back pain, or cough. She reports that her severe curvature of the spine has not been bothersome.  She reports that she will be traveling to [place] on 09/05/2022.  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   POLYPECTOMY     2001,2006-all colonic mucosa    SUBMANDIBULAR GLAND EXCISION Right 12/19/2015   Procedure: BIOPSY OF RIGHT SUBMANDIBULAR MASS;  Surgeon: Osborn Coho, MD;  Location: [facility] OR;  Service: ENT;  Laterality: Right;    SOCIAL HISTORY: Social History    Socioeconomic History   Marital status: Married    Spouse name: Not on file   Number of children: Not on file   Years of education: Not on file   Highest education level: Not on file  Occupational History   Not on file  Tobacco Use   Smoking status: Never   Smokeless tobacco: Never  Vaping Use   Vaping Use: Never used  Substance and Sexual Activity   Alcohol use: No    Alcohol/week: 0.0 standard drinks of alcohol   Drug use: No   Sexual activity: Not on file  Other Topics Concern   Not on file  Social History Narrative   ** Merged History Encounter **       Social Determinants of Health   Financial Resource Strain: Not on file  Food Insecurity: No Food Insecurity (05/18/2022)   Hunger Vital Sign    Worried About Running Out of Food in the Last Year: Never true    Ran Out of Food in the Last Year: Never true  Transportation Needs: No Transportation Needs (05/18/2022)   PRAPARE - Administrator, Civil Service (Medical): No    Lack of Transportation (Non-Medical): No  Physical Activity: Not on file  Stress: No Stress Concern Present (05/18/2022)   [employer] of Occupational Health - Occupational Stress Questionnaire    Feeling of Stress : Only a little  Social Connections: Not on file  Intimate Partner  Violence: Not At Risk (05/18/2022)   Humiliation, Afraid, Rape, and Kick questionnaire    Fear of Current or Ex-Partner: No    Emotionally Abused: No    Physically Abused: No    Sexually Abused: No    FAMILY HISTORY: Family History  Problem Relation Age of Onset   Colon cancer Neg Hx    Colon polyps Neg Hx    Esophageal cancer Neg Hx    Rectal cancer Neg Hx    Stomach cancer Neg Hx     ALLERGIES:  is allergic to benzonatate.  MEDICATIONS:  Current Outpatient Medications  Medication Sig Dispense Refill   atorvastatin (LIPITOR) 20 MG tablet Take 20 mg by mouth.     Cholecalciferol (VITAMIN D) 2000 units CAPS Take 2,000 Units by mouth daily.      co-enzyme Q-10 30 MG capsule Take 200 mg by mouth daily.     COVID-19 mRNA bivalent vaccine, Moderna, (MODERNA COVID-19 BIVAL BOOSTER) 50 MCG/0.5ML injection Inject into the muscle. 0.5 mL 0   COVID-19 mRNA vaccine, Moderna, 100 MCG/0.5ML injection Inject into the muscle. 0.25 mL 0   Multiple Vitamins-Minerals (MULTIVITAMIN WITH MINERALS) tablet Take 1 tablet by mouth.     naproxen sodium (ALEVE) 220 MG tablet Take by mouth.     Omega-3 Fatty Acids (FISH OIL TRIPLE STRENGTH) 1400 MG CAPS Take 1 capsule by mouth daily.     No current facility-administered medications for this visit.    REVIEW OF SYSTEMS:  10 Point review of Systems was done is negative except as noted above.   PHYSICAL EXAMINATION:  .BP (!) 158/62 (BP Location: Left Arm, Patient Position: Sitting)   Pulse 76   Temp 98.1 F (36.7 C) (Oral)   Resp 15   Wt 115 lb 11.2 oz (52.5 kg)   SpO2 97%   BMI 21.16 kg/m  GENERAL:alert, in no acute distress and comfortable SKIN: no acute rashes, no significant lesions EYES: conjunctiva are pink and non-injected, sclera anicteric OROPHARYNX: MMM, no exudates, no oropharyngeal erythema or ulceration NECK: supple, no JVD LYMPH:  no palpable lymphadenopathy in the cervical, axillary or inguinal regions LUNGS: clear to auscultation b/l with normal respiratory effort HEART: regular rate & rhythm ABDOMEN:  normoactive bowel sounds , non tender, not distended. Extremity: no pedal edema PSYCH: alert & oriented x 3 with fluent speech NEURO: no focal motor/sensory deficits   LABORATORY DATA:  I have reviewed the data as listed     Latest Ref Rng & Units 09/03/2022   11:53 AM 12/16/2021    8:37 AM 08/24/2021    8:58 AM  CBC  WBC 4.0 - 10.5 K/uL 8.4  8.8  6.6   Hemoglobin 12.0 - 15.0 g/dL 16.1  09.6  04.5   Hematocrit 36.0 - 46.0 % 44.9  45.3  44.7   Platelets 150 - 400 K/uL 214  202  462     .    Latest Ref Rng & Units 09/03/2022   11:53 AM 12/16/2021    8:37 AM 08/24/2021     8:58 AM  CMP  Glucose 70 - 99 mg/dL 82  85  99   BUN 8 - 23 mg/dL 18  21  15    Creatinine 0.44 - 1.00 mg/dL 4.09  8.11  9.14   Sodium 135 - 145 mmol/L 141  142  140   Potassium 3.5 - 5.1 mmol/L 4.9  4.4  4.8   Chloride 98 - 111 mmol/L 105  109  104  CO2 22 - 32 mmol/L 30  29  30    Calcium 8.9 - 10.3 mg/dL 16.1  9.8  9.4   Total Protein 6.5 - 8.1 g/dL 7.0  6.7  7.6   Total Bilirubin 0.3 - 1.2 mg/dL 0.6  0.4  0.5   Alkaline Phos 38 - 126 U/L 62  68  77   AST 15 - 41 U/L 26  24  23    ALT 0 - 44 U/L 20  18  23     . Lab Results  Component Value Date   LDH 177 09/03/2022         RADIOGRAPHIC STUDIES: I have personally reviewed the radiological images as listed and agreed with the findings in the report. No results found.  ASSESSMENT & PLAN:   88 y.o. 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 Patient imaging shows that this is at least a stage III A with mesenteric involvement as well. No overt evidence of bone marrow involvement but bone marrow biopsy is not planned for additional staging based on patient preference and the fact that it will not change treatment at this time . LDH level within normal limits FLIPI score - intermediate risk with 5 year OS 78% and ten-year overall survival 51% (from data in the pre-rituxan era) S/p ISRT to cervical LN  Disease. 07/08/17 PET/CT showed slightly increasing activity levels in the mesentery. 02/21/18 CT C/A/P revealed Areas of abnormal, ill-defined soft tissue in the mesentery and along the peritoneal surface identified on previous PET-CT are similar today. No new or progressive interval findings. 2.  Aortic Atherosclerois.  04/02/2019 CT C/A/P([phone]) revealed "Postoperative changes of right middle lobectomy as before. No signs of new adenopathy with stable perienteric and pericolonic soft tissue. Stable lower retroperitoneal soft  tissue along the common iliac vasculature. Stable mild pancreatic ductal distension, attention on follow-up Aortic Atherosclerosis (ICD10-I70.0)."  #2 lower lobe pulmonary nodule mildly hypermetabolic with an SUV of 2.03.  Suspicious for lung primary.  PLAN:  -Discussed lab results on 09/03/2022 in detail with patient. CBC stable, showed WBC of 8.4K, hemoglobin of 14.7, and platelets of 214K. -CMP normal -LDH wnl at 177 -last CT abdomen scan was 1 year ago -Discussed results of 09/02/2022 CT chest scan which showed no new findings. Did show plenty of radiation changes, but no new nodules or new concerning findings. -no clinical findings or evidence suggesting to initiate treatment at this time  -will order CT chest without contrast in 6 months  FOLLOW UP: CT chest/ wo contrast in 23 weeks RTC with Dr Candise Che with labs in 24 weeks  The total time spent in the appointment was 20 minutes* .  All of the patient's questions were answered with apparent satisfaction. The patient knows to call the clinic with any problems, questions or concerns.   Wyvonnia Lora MD MS AAHIVMS [facility] Hematology/Oncology Physician [facility]  .*Total Encounter Time as defined by the Centers for Medicare and Medicaid Services includes, in addition to the face-to-face time of a patient visit (documented in the note above) non-face-to-face time: obtaining and reviewing outside history, ordering and reviewing medications, tests or procedures, care coordination (communications with other health care professionals or caregivers) and documentation in the medical record.    I,Mitra Faeizi,acting as a Neurosurgeon for Wyvonnia Lora, MD.,have documented all relevant documentation on the behalf of Wyvonnia Lora, MD,as directed by  Wyvonnia Lora, MD while  in the presence of Wyvonnia Lora, MD.  .I have reviewed the above documentation for accuracy and completeness, and I agree with the above. Johney Maine MD

## 2022-10-01 ENCOUNTER — Telehealth: Payer: Self-pay | Admitting: Hematology

## 2022-10-01 NOTE — Telephone Encounter (Signed)
Patient stated they would be out of town, was able to cancel their appointment and also transfer them over to Radiology so they were able to reschedule their appointments there as well.

## 2022-12-15 DIAGNOSIS — M810 Age-related osteoporosis without current pathological fracture: Secondary | ICD-10-CM | POA: Diagnosis not present

## 2022-12-28 DIAGNOSIS — Z23 Encounter for immunization: Secondary | ICD-10-CM | POA: Diagnosis not present

## 2023-01-11 DIAGNOSIS — E785 Hyperlipidemia, unspecified: Secondary | ICD-10-CM | POA: Diagnosis not present

## 2023-01-11 DIAGNOSIS — Z8572 Personal history of non-Hodgkin lymphomas: Secondary | ICD-10-CM | POA: Diagnosis not present

## 2023-01-11 DIAGNOSIS — Z8261 Family history of arthritis: Secondary | ICD-10-CM | POA: Diagnosis not present

## 2023-01-11 DIAGNOSIS — Z832 Family history of diseases of the blood and blood-forming organs and certain disorders involving the immune mechanism: Secondary | ICD-10-CM | POA: Diagnosis not present

## 2023-01-25 ENCOUNTER — Ambulatory Visit (HOSPITAL_COMMUNITY)
Admission: RE | Admit: 2023-01-25 | Discharge: 2023-01-25 | Disposition: A | Discharge status: Home / Self Care | Payer: Medicare Other | Source: Ambulatory Visit | Attending: Hematology | Admitting: Hematology

## 2023-01-25 DIAGNOSIS — I700 Atherosclerosis of aorta: Secondary | ICD-10-CM | POA: Diagnosis not present

## 2023-01-25 DIAGNOSIS — C3490 Malignant neoplasm of unspecified part of unspecified bronchus or lung: Secondary | ICD-10-CM | POA: Diagnosis not present

## 2023-01-25 DIAGNOSIS — R918 Other nonspecific abnormal finding of lung field: Secondary | ICD-10-CM | POA: Insufficient documentation

## 2023-01-31 DIAGNOSIS — M810 Age-related osteoporosis without current pathological fracture: Secondary | ICD-10-CM | POA: Diagnosis not present

## 2023-01-31 DIAGNOSIS — E7800 Pure hypercholesterolemia, unspecified: Secondary | ICD-10-CM | POA: Diagnosis not present

## 2023-01-31 DIAGNOSIS — E041 Nontoxic single thyroid nodule: Secondary | ICD-10-CM | POA: Diagnosis not present

## 2023-02-01 DIAGNOSIS — S80812A Abrasion, left lower leg, initial encounter: Secondary | ICD-10-CM | POA: Diagnosis not present

## 2023-02-02 ENCOUNTER — Other Ambulatory Visit: Payer: Medicare Other

## 2023-02-02 ENCOUNTER — Ambulatory Visit: Payer: Medicare Other | Admitting: Hematology

## 2023-02-03 DIAGNOSIS — Z902 Acquired absence of lung [part of]: Secondary | ICD-10-CM | POA: Diagnosis not present

## 2023-02-03 DIAGNOSIS — I700 Atherosclerosis of aorta: Secondary | ICD-10-CM | POA: Diagnosis not present

## 2023-02-03 DIAGNOSIS — R911 Solitary pulmonary nodule: Secondary | ICD-10-CM | POA: Diagnosis not present

## 2023-02-03 DIAGNOSIS — M810 Age-related osteoporosis without current pathological fracture: Secondary | ICD-10-CM | POA: Diagnosis not present

## 2023-02-03 DIAGNOSIS — Z923 Personal history of irradiation: Secondary | ICD-10-CM | POA: Diagnosis not present

## 2023-02-03 DIAGNOSIS — C8201 Follicular lymphoma grade I, lymph nodes of head, face, and neck: Secondary | ICD-10-CM | POA: Diagnosis not present

## 2023-02-03 DIAGNOSIS — Z0000 Encounter for general adult medical examination without abnormal findings: Secondary | ICD-10-CM | POA: Diagnosis not present

## 2023-02-03 DIAGNOSIS — Z8639 Personal history of other endocrine, nutritional and metabolic disease: Secondary | ICD-10-CM | POA: Diagnosis not present

## 2023-02-14 ENCOUNTER — Other Ambulatory Visit: Payer: Self-pay

## 2023-02-14 DIAGNOSIS — C8201 Follicular lymphoma grade I, lymph nodes of head, face, and neck: Secondary | ICD-10-CM

## 2023-02-16 ENCOUNTER — Other Ambulatory Visit: Payer: Self-pay

## 2023-02-16 ENCOUNTER — Inpatient Hospital Stay: Payer: Medicare Other | Attending: Hematology

## 2023-02-16 ENCOUNTER — Inpatient Hospital Stay (HOSPITAL_BASED_OUTPATIENT_CLINIC_OR_DEPARTMENT_OTHER): Payer: Medicare Other | Admitting: Hematology

## 2023-02-16 VITALS — BP 160/65 | HR 82 | Temp 97.0°F | Resp 18 | Wt 113.1 lb

## 2023-02-16 DIAGNOSIS — R350 Frequency of micturition: Secondary | ICD-10-CM

## 2023-02-16 DIAGNOSIS — C8201 Follicular lymphoma grade I, lymph nodes of head, face, and neck: Secondary | ICD-10-CM

## 2023-02-16 DIAGNOSIS — N390 Urinary tract infection, site not specified: Secondary | ICD-10-CM

## 2023-02-16 DIAGNOSIS — C8231 Follicular lymphoma grade IIIa, lymph nodes of head, face, and neck: Secondary | ICD-10-CM | POA: Diagnosis not present

## 2023-02-16 DIAGNOSIS — M7989 Other specified soft tissue disorders: Secondary | ICD-10-CM | POA: Diagnosis not present

## 2023-02-16 DIAGNOSIS — R918 Other nonspecific abnormal finding of lung field: Secondary | ICD-10-CM

## 2023-02-16 LAB — CBC WITH DIFFERENTIAL (CANCER CENTER ONLY)
Abs Immature Granulocytes: 0.03 10*3/uL (ref 0.00–0.07)
Basophils Absolute: 0.1 10*3/uL (ref 0.0–0.1)
Basophils Relative: 1 %
Eosinophils Absolute: 0.2 10*3/uL (ref 0.0–0.5)
Eosinophils Relative: 3 %
HCT: 42.7 % (ref 36.0–46.0)
Hemoglobin: 14.1 g/dL (ref 12.0–15.0)
Immature Granulocytes: 1 %
Lymphocytes Relative: 25 %
Lymphs Abs: 1.6 10*3/uL (ref 0.7–4.0)
MCH: 30.1 pg (ref 26.0–34.0)
MCHC: 33 g/dL (ref 30.0–36.0)
MCV: 91 fL (ref 80.0–100.0)
Monocytes Absolute: 0.6 10*3/uL (ref 0.1–1.0)
Monocytes Relative: 9 %
Neutro Abs: 4 10*3/uL (ref 1.7–7.7)
Neutrophils Relative %: 61 %
Platelet Count: 238 10*3/uL (ref 150–400)
RBC: 4.69 MIL/uL (ref 3.87–5.11)
RDW: 14 % (ref 11.5–15.5)
WBC Count: 6.5 10*3/uL (ref 4.0–10.5)
nRBC: 0 % (ref 0.0–0.2)

## 2023-02-16 LAB — CMP (CANCER CENTER ONLY)
ALT: 17 U/L (ref 0–44)
AST: 22 U/L (ref 15–41)
Albumin: 4.1 g/dL (ref 3.5–5.0)
Alkaline Phosphatase: 56 U/L (ref 38–126)
Anion gap: 6 (ref 5–15)
BUN: 17 mg/dL (ref 8–23)
CO2: 28 mmol/L (ref 22–32)
Calcium: 9.7 mg/dL (ref 8.9–10.3)
Chloride: 108 mmol/L (ref 98–111)
Creatinine: 0.9 mg/dL (ref 0.44–1.00)
GFR, Estimated: >60 mL/min (ref >60)
Glucose, Bld: 99 mg/dL (ref 70–99)
Potassium: 3.9 mmol/L (ref 3.5–5.1)
Sodium: 142 mmol/L (ref 135–145)
Total Bilirubin: 0.5 mg/dL (ref 0.3–1.2)
Total Protein: 6.8 g/dL (ref 6.5–8.1)

## 2023-02-16 LAB — URINALYSIS, COMPLETE (UACMP) WITH MICROSCOPIC
Bacteria, UA: NONE SEEN
Bilirubin Urine: NEGATIVE
Glucose, UA: NEGATIVE mg/dL
Ketones, ur: NEGATIVE mg/dL
Nitrite: NEGATIVE
Protein, ur: NEGATIVE mg/dL
RBC / HPF: >50 RBC/hpf (ref 0–5)
Specific Gravity, Urine: 1.013 (ref 1.005–1.030)
WBC, UA: >50 WBC/hpf (ref 0–5)
pH: 6 (ref 5.0–8.0)

## 2023-02-16 LAB — LACTATE DEHYDROGENASE: LDH: 165 U/L (ref 98–192)

## 2023-02-16 MED ORDER — CEFPODOXIME PROXETIL 100 MG PO TABS: 100.0000 mg | ORAL_TABLET | Freq: Two times a day (BID) | ORAL | 0 refills | Status: AC

## 2023-02-16 MED ORDER — CEFPODOXIME PROXETIL 100 MG PO TABS: 100.0000 mg | ORAL_TABLET | Freq: Two times a day (BID) | ORAL | 0 refills | Status: DC

## 2023-02-16 NOTE — Progress Notes (Signed)
HEMATOLOGY/ONCOLOGY CLINIC VISIT NOTE  Date of Service: 02/16/23    Patient Care Team: Merri Brunette, MD as PCP - General (Internal Medicine)  CHIEF COMPLAINTS/PURPOSE OF CONSULTATION:   Follow-up for follicular lymphoma and follow-up lung nodule.    HISTORY OF PRESENTING ILLNESS:   Please see previous notes for details of initial presentation  INTERVAL HISTORY:  [name] [name] is a 88 y.o. female here for continued evaluation and management of follicular lymphoma and lung nodule. Patient was last seen by me on 09/03/2022 and reported a mild lump in her throat and occasional lower extremity edema.   Today, she reports that she has been doing well overall since her last clinical visit. She reports that she traveled to [place] in May after her last visit with [place] and also recently returned from travels to [place]. She is planning to travel to [place] next month.   She reports having leg swelling in her left leg due to having trauma to her shin during her travels. Her leg swelling is improving at this time.   She complains of frequent urination and believes she may have a UTI. Patient reports that her symptoms suddenly appeared mid-last week. She denies any history of kidney stones previously. Patient stays well hydrated and denies any fevers or chills. She reports that she is not allergic to any antibiotic.   She denies any new lumps/bumps, chills, fever, night sweats, new abdominal pain, change in bowel habits,  or change in breathing habits.   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   POLYPECTOMY     2001,2006-all colonic mucosa    SUBMANDIBULAR GLAND EXCISION Right 12/19/2015   Procedure: BIOPSY OF RIGHT SUBMANDIBULAR MASS;  Surgeon: Osborn Coho, MD;  Location: [facility] OR;  Service: ENT;  Laterality: Right;    SOCIAL HISTORY: Social History   Socioeconomic History   Marital status: Married    Spouse name: Not on file   Number of children: Not on file   Years of education: Not on file   Highest education level: Not on file  Occupational History   Not on file  Tobacco Use   Smoking status: Never   Smokeless tobacco: Never  Vaping Use   Vaping status: Never Used  Substance and Sexual Activity   Alcohol use: No    Alcohol/week: 0.0 standard drinks of alcohol   Drug use: No   Sexual activity: Not on file  Other Topics Concern   Not on file  Social History Narrative   ** Merged History Encounter **       Social Determinants of Health   Financial Resource Strain: Not on file  Food Insecurity: No Food Insecurity (05/18/2022)   Hunger Vital Sign    Worried About Running Out of Food in the Last Year: Never true    Ran Out of Food in the Last Year: Never true  Transportation Needs: No Transportation Needs (05/18/2022)  PRAPARE - Administrator, Civil Service (Medical): No    Lack of Transportation (Non-Medical): No  Physical Activity: Not on file  Stress: No Stress Concern Present (05/18/2022)   [employer] of Occupational Health - Occupational Stress Questionnaire    Feeling of Stress : Only a little  Social Connections: Not on file  Intimate Partner Violence: Not At Risk (05/18/2022)   Humiliation, Afraid, Rape, and Kick questionnaire    Fear of Current or Ex-Partner: No    Emotionally Abused: No    Physically Abused: No    Sexually Abused: No    FAMILY HISTORY: Family History  Problem Relation Age of Onset   Colon cancer Neg Hx    Colon polyps Neg  Hx    Esophageal cancer Neg Hx    Rectal cancer Neg Hx    Stomach cancer Neg Hx     ALLERGIES:  is allergic to benzonatate.  MEDICATIONS:  Current Outpatient Medications  Medication Sig Dispense Refill   atorvastatin (LIPITOR) 20 MG tablet Take 20 mg by mouth.     Cholecalciferol (VITAMIN D) 2000 units CAPS Take 2,000 Units by mouth daily.     co-enzyme Q-10 30 MG capsule Take 200 mg by mouth daily.     COVID-19 mRNA bivalent vaccine, Moderna, (MODERNA COVID-19 BIVAL BOOSTER) 50 MCG/0.5ML injection Inject into the muscle. 0.5 mL 0   COVID-19 mRNA vaccine, Moderna, 100 MCG/0.5ML injection Inject into the muscle. 0.25 mL 0   Multiple Vitamins-Minerals (MULTIVITAMIN WITH MINERALS) tablet Take 1 tablet by mouth.     naproxen sodium (ALEVE) 220 MG tablet Take by mouth.     Omega-3 Fatty Acids (FISH OIL TRIPLE STRENGTH) 1400 MG CAPS Take 1 capsule by mouth daily.     No current facility-administered medications for this visit.    REVIEW OF SYSTEMS:  10 Point review of Systems was done is negative except as noted above.    PHYSICAL EXAMINATION:  .There were no vitals taken for this visit.   GENERAL:alert, in no acute distress and comfortable SKIN: no acute rashes, no significant lesions EYES: conjunctiva are pink and non-injected, sclera anicteric OROPHARYNX: MMM, no exudates, no oropharyngeal erythema or ulceration NECK: supple, no JVD LYMPH:  no palpable lymphadenopathy in the cervical, axillary or inguinal regions LUNGS: clear to auscultation b/l with normal respiratory effort HEART: regular rate & rhythm ABDOMEN:  normoactive bowel sounds , non tender, not distended. Extremity: no pedal edema PSYCH: alert & oriented x 3 with fluent speech NEURO: no focal motor/sensory deficits    LABORATORY DATA:  I have reviewed the data as listed     Latest Ref Rng & Units 09/03/2022   11:53 AM 12/16/2021    8:37 AM 08/24/2021    8:58 AM  CBC  WBC 4.0 - 10.5 K/uL 8.4  8.8  6.6    Hemoglobin 12.0 - 15.0 g/dL 57.8  46.9  62.9   Hematocrit 36.0 - 46.0 % 44.9  45.3  44.7   Platelets 150 - 400 K/uL 214  202  462     .    Latest Ref Rng & Units 09/03/2022   11:53 AM 12/16/2021    8:37 AM 08/24/2021    8:58 AM  CMP  Glucose 70 - 99 mg/dL 82  85  99   BUN 8 - 23 mg/dL 18  21  15    Creatinine 0.44 - 1.00 mg/dL 5.28  4.13  2.44   Sodium 135 - 145 mmol/L 141  142  140   Potassium 3.5 - 5.1 mmol/L 4.9  4.4  4.8   Chloride 98 - 111 mmol/L 105  109  104   CO2 22 - 32 mmol/L 30  29  30    Calcium 8.9 - 10.3 mg/dL 40.9  9.8  9.4   Total Protein 6.5 - 8.1 g/dL 7.0  6.7  7.6   Total Bilirubin 0.3 - 1.2 mg/dL 0.6  0.4  0.5   Alkaline Phos 38 - 126 U/L 62  68  77   AST 15 - 41 U/L 26  24  23    ALT 0 - 44 U/L 20  18  23     . Lab Results  Component Value Date   LDH 177 09/03/2022         RADIOGRAPHIC STUDIES: I have personally reviewed the radiological images as listed and agreed with the findings in the report. CT Chest Wo Contrast  Result Date: 02/04/2023 CLINICAL DATA:  Non-small cell lung cancer status post radiation therapy. Restaging. Additional history of lymphoma. * Tracking Code: BO * EXAM: CT CHEST WITHOUT CONTRAST TECHNIQUE: Multidetector CT imaging of the chest was performed following the standard protocol without IV contrast. RADIATION DOSE REDUCTION: This exam was performed according to the departmental dose-optimization program which includes automated exposure control, adjustment of the mA and/or kV according to patient size and/or use of iterative reconstruction technique. COMPARISON:  09/02/2022 chest CT. FINDINGS: Cardiovascular: Normal heart size. No significant pericardial effusion/thickening. Right coronary atherosclerosis. Atherosclerotic nonaneurysmal thoracic aorta. Normal caliber pulmonary arteries. Mediastinum/Nodes: No significant thyroid nodules. Unremarkable esophagus. No pathologically enlarged axillary, mediastinal or hilar lymph nodes, noting  limited sensitivity for the detection of hilar adenopathy on this noncontrast study. Surgical clips again noted in the right hilum. Lungs/Pleura: No pneumothorax. No pleural effusion. Redemonstration of postsurgical changes from right middle lobectomy. Sharply marginated bandlike consolidation in the anterior peripheral right lower lobe with associated bronchiectasis, volume loss and distortion, decreased in thickness and with increased volume loss since 09/02/2022 chest CT, compatible with evolving post radiation change, without discrete visualization of the treated subsolid pulmonary nodule seen in this location on 01/20/2022 chest CT. No acute consolidative airspace disease or new significant pulmonary nodules. Stable tiny calcified peripheral left lower lobe granuloma. Upper abdomen: No acute abnormality. Musculoskeletal: No aggressive appearing focal osseous lesions. Moderate thoracic spondylosis. IMPRESSION: 1. Expected evolution of post radiation change in the right lower lobe, without residual or recurrent tumor. 2. No evidence of metastatic disease in the chest. 3. One vessel coronary atherosclerosis. 4.  Aortic Atherosclerosis (ICD10-I70.0). Electronically Signed   By: Delbert Phenix M.D.   On: 02/04/2023 14:18    ASSESSMENT & PLAN:   88 y.o. 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 Patient imaging shows that this is at least a stage III A with mesenteric involvement as well. No overt evidence of bone marrow involvement but bone marrow biopsy is not planned for additional staging based on patient preference and the fact that it will not change treatment at this time . LDH level within normal limits FLIPI score - intermediate risk with 5 year OS 78% and ten-year overall survival 51% (from data in the pre-rituxan era) S/p ISRT to cervical LN  Disease. 07/08/17 PET/CT showed slightly increasing  activity levels in the mesentery. 02/21/18 CT C/A/P revealed Areas of abnormal, ill-defined soft tissue in the mesentery and along the peritoneal surface identified on  previous PET-CT are similar today. No new or progressive interval findings. 2.  Aortic Atherosclerois.  04/02/2019 CT C/A/P([phone]) revealed "Postoperative changes of right middle lobectomy as before. No signs of new adenopathy with stable perienteric and pericolonic soft tissue. Stable lower retroperitoneal soft tissue along the common iliac vasculature. Stable mild pancreatic ductal distension, attention on follow-up Aortic Atherosclerosis (ICD10-I70.0)."  #2 lower lobe pulmonary nodule mildly hypermetabolic with an SUV of 2.03.  Suspicious for lung primary.  PLAN:  -Discussed lab results on 02/16/23 in detail with patient. CBC showed WBC of 6.5K, hemoglobin of 14.1, and platelets of 238K. -CMP normal -LDH normal -discussed results of her CT scan from 01/25/2023 in detail. Findings were normal. The spot in her right lower lobe that has been radiated has resolved and there is no recurrent tumor. Patient has no evidence of metastatic disease in the chest.  -I suspect that patient likely has an uncomplicated UTI.  -will order urine culture and urinalysis -I will send out a urine sample before sending out an antibiotic in case her symptoms continue and there is any role to adjust her antibiotics.  -will order Vantin to take twice a day for 1 week -will continue to monitor with labs in 6 months -answered all of patient's questions in detail  FOLLOW UP: UA, UCX today RTC with Dr Candise Che with labs in 6 months  The total time spent in the appointment was *** minutes* .  All of the patient's questions were answered with apparent satisfaction. The patient knows to call the clinic with any problems, questions or concerns.   Wyvonnia Lora MD MS AAHIVMS [facility] Hematology/Oncology Physician [facility]  .*Total  Encounter Time as defined by the Centers for Medicare and Medicaid Services includes, in addition to the face-to-face time of a patient visit (documented in the note above) non-face-to-face time: obtaining and reviewing outside history, ordering and reviewing medications, tests or procedures, care coordination (communications with other health care professionals or caregivers) and documentation in the medical record.    I,Mitra Faeizi,acting as a Neurosurgeon for Wyvonnia Lora, MD.,have documented all relevant documentation on the behalf of Wyvonnia Lora, MD,as directed by  Wyvonnia Lora, MD while in the presence of Wyvonnia Lora, MD.  ***

## 2023-02-17 LAB — URINE CULTURE: Culture: <10000 — AB

## 2023-02-18 ENCOUNTER — Encounter: Payer: Self-pay | Admitting: Internal Medicine

## 2023-02-19 ENCOUNTER — Telehealth: Payer: Self-pay | Admitting: Hematology

## 2023-02-19 NOTE — Telephone Encounter (Signed)
Per Candise Che 10/30 los patient is aware of scheduled appointment times/dates

## 2023-03-03 ENCOUNTER — Other Ambulatory Visit: Payer: Self-pay | Admitting: Hematology

## 2023-03-22 DIAGNOSIS — R630 Anorexia: Secondary | ICD-10-CM | POA: Diagnosis not present

## 2023-03-22 DIAGNOSIS — R5383 Other fatigue: Secondary | ICD-10-CM | POA: Diagnosis not present

## 2023-03-22 DIAGNOSIS — R300 Dysuria: Secondary | ICD-10-CM | POA: Diagnosis not present

## 2023-03-22 DIAGNOSIS — K5909 Other constipation: Secondary | ICD-10-CM | POA: Diagnosis not present

## 2023-03-24 DIAGNOSIS — E875 Hyperkalemia: Secondary | ICD-10-CM | POA: Diagnosis not present

## 2023-03-29 DIAGNOSIS — E871 Hypo-osmolality and hyponatremia: Secondary | ICD-10-CM | POA: Diagnosis not present

## 2023-03-29 DIAGNOSIS — E875 Hyperkalemia: Secondary | ICD-10-CM | POA: Diagnosis not present

## 2023-04-05 DIAGNOSIS — D649 Anemia, unspecified: Secondary | ICD-10-CM | POA: Diagnosis not present

## 2023-06-08 ENCOUNTER — Other Ambulatory Visit (HOSPITAL_BASED_OUTPATIENT_CLINIC_OR_DEPARTMENT_OTHER): Payer: Self-pay | Admitting: Internal Medicine

## 2023-06-08 DIAGNOSIS — Z1231 Encounter for screening mammogram for malignant neoplasm of breast: Secondary | ICD-10-CM

## 2023-06-20 DIAGNOSIS — M810 Age-related osteoporosis without current pathological fracture: Secondary | ICD-10-CM | POA: Diagnosis not present

## 2023-07-11 DIAGNOSIS — H52203 Unspecified astigmatism, bilateral: Secondary | ICD-10-CM | POA: Diagnosis not present

## 2023-07-11 DIAGNOSIS — Z961 Presence of intraocular lens: Secondary | ICD-10-CM | POA: Diagnosis not present

## 2023-07-11 DIAGNOSIS — H1789 Other corneal scars and opacities: Secondary | ICD-10-CM | POA: Diagnosis not present

## 2023-07-12 ENCOUNTER — Other Ambulatory Visit: Payer: Self-pay

## 2023-07-12 DIAGNOSIS — C8201 Follicular lymphoma grade I, lymph nodes of head, face, and neck: Secondary | ICD-10-CM

## 2023-07-12 NOTE — Progress Notes (Signed)
 HEMATOLOGY/ONCOLOGY CLINIC VISIT NOTE  Date of Service: 07/13/2023    Patient Care Team: Merri Brunette, MD as PCP - General (Internal Medicine)  CHIEF COMPLAINTS/PURPOSE OF CONSULTATION:   Follow-up for follicular lymphoma and follow-up lung nodule.    HISTORY OF PRESENTING ILLNESS:   Please see previous notes for details of initial presentation  INTERVAL HISTORY:  [name] [name] is a 89 y.o. female here for continued evaluation and management of follicular lymphoma and lung nodule. Patient was last seen by me on 02/16/2023 and reported left leg swelling due to shin injury and frequent urination.  Today, she reports that she has been doing well since her last clinical visit. Patient has enjoyed her time during recent travels to [place].   She reports no new symptoms such as new lumps/bumps, fever, chills, night sweats  Patient is unsure if she has any enlarged lymph nodes in the neck, though she reports no obvious enlarged lymph nodes.   She denies any change in breathing, abdominal pain/distention, change in bowel/urinary habits, or abdominal pain.   She reports having a returned UTI while in [place] during her travels. Patient presented to the emergency department and had an abdominal US. Patient's previous urine culture on 02/16/2023 showed insignificant growth. Patient reports no concern for UTI at this time. She denies any other infection issues.   She reports that she will travel to [place] in June 2025.   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   POLYPECTOMY     2001,2006-all colonic mucosa    SUBMANDIBULAR GLAND EXCISION Right 12/19/2015   Procedure: BIOPSY OF RIGHT SUBMANDIBULAR MASS;  Surgeon: Osborn Coho, MD;  Location: [facility] OR;  Service: ENT;  Laterality: Right;    SOCIAL HISTORY: Social History   Socioeconomic History   Marital status: Married    Spouse name: Not on file   Number of children: Not on file   Years of education: Not on file   Highest education level: Not on file  Occupational History   Not on file  Tobacco Use   Smoking status: Never   Smokeless tobacco: Never  Vaping Use   Vaping status: Never Used  Substance and Sexual Activity   Alcohol use: No    Alcohol/week: 0.0 standard drinks of alcohol   Drug use: No   Sexual activity: Not on file  Other Topics Concern   Not on file  Social History Narrative   ** Merged History Encounter **       Social Drivers of Health   Financial Resource Strain: Not on file  Food Insecurity: No Food Insecurity (05/18/2022)   Hunger Vital Sign    Worried About Running Out of Food in the Last Year: Never true    Ran Out of Food in the Last Year: Never true  Transportation Needs: No Transportation Needs (05/18/2022)   PRAPARE - Administrator, Civil Service (Medical):  No    Lack of Transportation (Non-Medical): No  Physical Activity: Not on file  Stress: No Stress Concern Present (05/18/2022)   [employer] of Occupational Health - Occupational Stress Questionnaire    Feeling of Stress : Only a little  Social Connections: Not on file  Intimate Partner Violence: Not At Risk (05/18/2022)   Humiliation, Afraid, Rape, and Kick questionnaire    Fear of Current or Ex-Partner: No    Emotionally Abused: No    Physically Abused: No    Sexually Abused: No    FAMILY HISTORY: Family History  Problem Relation Age of Onset   Colon cancer Neg Hx    Colon polyps Neg Hx    Esophageal cancer  Neg Hx    Rectal cancer Neg Hx    Stomach cancer Neg Hx     ALLERGIES:  is allergic to benzonatate.  MEDICATIONS:  Current Outpatient Medications  Medication Sig Dispense Refill   atorvastatin (LIPITOR) 20 MG tablet Take 20 mg by mouth.     Cholecalciferol (VITAMIN D) 2000 units CAPS Take 2,000 Units by mouth daily.     co-enzyme Q-10 30 MG capsule Take 200 mg by mouth daily.     COVID-19 mRNA bivalent vaccine, Moderna, (MODERNA COVID-19 BIVAL BOOSTER) 50 MCG/0.5ML injection Inject into the muscle. 0.5 mL 0   COVID-19 mRNA vaccine, Moderna, 100 MCG/0.5ML injection Inject into the muscle. 0.25 mL 0   Multiple Vitamins-Minerals (MULTIVITAMIN WITH MINERALS) tablet Take 1 tablet by mouth.     naproxen sodium (ALEVE) 220 MG tablet Take by mouth.     Omega-3 Fatty Acids (FISH OIL TRIPLE STRENGTH) 1400 MG CAPS Take 1 capsule by mouth daily.     No current facility-administered medications for this visit.    REVIEW OF SYSTEMS:  10 Point review of Systems was done is negative except as noted above.   PHYSICAL EXAMINATION:  .BP (!) 163/49 (BP Location: Left Arm, Patient Position: Sitting) Comment: 148/47 LA  Pulse 77   Temp 98 F (36.7 C) (Tympanic)   Resp 18   Ht 5\' 2"  (1.575 m)   Wt 112 lb 6.4 oz (51 kg)   SpO2 100%   BMI 20.56 kg/m   GENERAL:alert, in no acute distress and comfortable SKIN: no acute rashes, no significant lesions EYES: conjunctiva are pink and non-injected, sclera anicteric OROPHARYNX: MMM, no exudates, no oropharyngeal erythema or ulceration NECK: supple, no JVD LYMPH:  no palpable lymphadenopathy in the cervical, axillary or inguinal regions LUNGS: clear to auscultation b/l with normal respiratory effort HEART: regular rate & rhythm ABDOMEN:  normoactive bowel sounds , non tender, not distended. Extremity: no pedal edema PSYCH: alert & oriented x 3 with fluent speech NEURO: no focal motor/sensory deficits   LABORATORY DATA:  I have reviewed the data as  listed     Latest Ref Rng & Units 07/13/2023    9:00 AM 02/16/2023    8:34 AM 09/03/2022   11:53 AM  CBC  WBC 4.0 - 10.5 K/uL 6.8  6.5  8.4   Hemoglobin 12.0 - 15.0 g/dL 08.6  57.8  46.9   Hematocrit 36.0 - 46.0 % 45.0  42.7  44.9   Platelets 150 - 400 K/uL 195  238  214     .    Latest Ref Rng & Units 07/13/2023    9:00 AM 02/16/2023    8:34 AM 09/03/2022   11:53 AM  CMP  Glucose 70 - 99 mg/dL 99  99  82  BUN 8 - 23 mg/dL 20  17  18    Creatinine 0.44 - 1.00 mg/dL 4.54  0.98  1.19   Sodium 135 - 145 mmol/L 141  142  141   Potassium 3.5 - 5.1 mmol/L 4.1  3.9  4.9   Chloride 98 - 111 mmol/L 106  108  105   CO2 22 - 32 mmol/L 28  28  30    Calcium 8.9 - 10.3 mg/dL 9.4  9.7  14.7   Total Protein 6.5 - 8.1 g/dL 6.7  6.8  7.0   Total Bilirubin 0.0 - 1.2 mg/dL 0.4  0.5  0.6   Alkaline Phos 38 - 126 U/L 75  56  62   AST 15 - 41 U/L 24  22  26    ALT 0 - 44 U/L 19  17  20     . Lab Results  Component Value Date   LDH 165 02/16/2023         RADIOGRAPHIC STUDIES: I have personally reviewed the radiological images as listed and agreed with the findings in the report. No results found.  ASSESSMENT & PLAN:   89 y.o. 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 Patient imaging shows that this is at least a stage III A with mesenteric involvement as well. No overt evidence of bone marrow involvement but bone marrow biopsy is not planned for additional staging based on patient preference and the fact that it will not change treatment at this time . LDH level within normal limits FLIPI score - intermediate risk with 5 year OS 78% and ten-year overall survival 51% (from data in the pre-rituxan era) S/p ISRT to cervical LN  Disease. 07/08/17 PET/CT showed slightly increasing activity levels in the mesentery. 02/21/18 CT C/A/P revealed Areas of abnormal, ill-defined soft tissue in the  mesentery and along the peritoneal surface identified on previous PET-CT are similar today. No new or progressive interval findings. 2.  Aortic Atherosclerois.  04/02/2019 CT C/A/P([id number]) revealed "Postoperative changes of right middle lobectomy as before. No signs of new adenopathy with stable perienteric and pericolonic soft tissue. Stable lower retroperitoneal soft tissue along the common iliac vasculature. Stable mild pancreatic ductal distension, attention on follow-up Aortic Atherosclerosis (ICD10-I70.0)."  #2 lower lobe pulmonary nodule mildly hypermetabolic with an SUV of 2.03.  Suspicious for lung primary.  PLAN:  -Discussed lab results on 07/13/2023 in detail with patient. CBC normal showed WBC of 6.8K, hemoglobin of 14.6, and platelets of 195K. -CMP normal -patient has no clinical sign or lab evidence of lymphoma progression at this time.  -discussed that her last CT scan from 01/25/2023 showed that the spot that was treated with radiation therapy had resolved.  -discussed that from a lung standpoint, we shall plan to repeat a scan before her next visit in 6 months to ensure there are no new findings -patient shall return to clinic in 6 months  FOLLOW UP: CT chest wo contrast in 22 weeks RTC with Dr Candise Che with labs in 24 weeks  The total time spent in the appointment was 21 minutes* .  All of the patient's questions were answered with apparent satisfaction. The patient knows to call the clinic with any problems, questions or concerns.   Wyvonnia Lora MD MS AAHIVMS [facility] Hematology/Oncology Physician [facility]  .*Total Encounter Time as defined by the Centers for Medicare and Medicaid Services includes, in addition to the  face-to-face time of a patient visit (documented in the note above) non-face-to-face time: obtaining and reviewing outside history, ordering and reviewing medications, tests or procedures, care coordination (communications with other health  care professionals or caregivers) and documentation in the medical record.    I,Mitra Faeizi,acting as a Neurosurgeon for Wyvonnia Lora, MD.,have documented all relevant documentation on the behalf of Wyvonnia Lora, MD,as directed by  Wyvonnia Lora, MD while in the presence of Wyvonnia Lora, MD.  .I have reviewed the above documentation for accuracy and completeness, and I agree with the above. Johney Maine MD

## 2023-07-13 ENCOUNTER — Inpatient Hospital Stay: Payer: Medicare Other | Attending: Hematology

## 2023-07-13 ENCOUNTER — Inpatient Hospital Stay (HOSPITAL_BASED_OUTPATIENT_CLINIC_OR_DEPARTMENT_OTHER): Payer: Medicare Other | Admitting: Hematology

## 2023-07-13 DIAGNOSIS — R911 Solitary pulmonary nodule: Secondary | ICD-10-CM | POA: Insufficient documentation

## 2023-07-13 DIAGNOSIS — Z923 Personal history of irradiation: Secondary | ICD-10-CM | POA: Diagnosis not present

## 2023-07-13 DIAGNOSIS — C8231 Follicular lymphoma grade IIIa, lymph nodes of head, face, and neck: Secondary | ICD-10-CM | POA: Insufficient documentation

## 2023-07-13 DIAGNOSIS — R918 Other nonspecific abnormal finding of lung field: Secondary | ICD-10-CM

## 2023-07-13 DIAGNOSIS — C8201 Follicular lymphoma grade I, lymph nodes of head, face, and neck: Secondary | ICD-10-CM

## 2023-07-13 LAB — CMP (CANCER CENTER ONLY)
ALT: 19 U/L (ref 0–44)
AST: 24 U/L (ref 15–41)
Albumin: 4.3 g/dL (ref 3.5–5.0)
Alkaline Phosphatase: 75 U/L (ref 38–126)
Anion gap: 7 (ref 5–15)
BUN: 20 mg/dL (ref 8–23)
CO2: 28 mmol/L (ref 22–32)
Calcium: 9.4 mg/dL (ref 8.9–10.3)
Chloride: 106 mmol/L (ref 98–111)
Creatinine: 0.9 mg/dL (ref 0.44–1.00)
GFR, Estimated: >60 mL/min (ref >60)
Glucose, Bld: 99 mg/dL (ref 70–99)
Potassium: 4.1 mmol/L (ref 3.5–5.1)
Sodium: 141 mmol/L (ref 135–145)
Total Bilirubin: 0.4 mg/dL (ref 0.0–1.2)
Total Protein: 6.7 g/dL (ref 6.5–8.1)

## 2023-07-13 LAB — CBC WITH DIFFERENTIAL (CANCER CENTER ONLY)
Abs Immature Granulocytes: 0.02 10*3/uL (ref 0.00–0.07)
Basophils Absolute: 0.1 10*3/uL (ref 0.0–0.1)
Basophils Relative: 1 %
Eosinophils Absolute: 0.1 10*3/uL (ref 0.0–0.5)
Eosinophils Relative: 2 %
HCT: 45 % (ref 36.0–46.0)
Hemoglobin: 14.6 g/dL (ref 12.0–15.0)
Immature Granulocytes: 0 %
Lymphocytes Relative: 25 %
Lymphs Abs: 1.7 10*3/uL (ref 0.7–4.0)
MCH: 29.6 pg (ref 26.0–34.0)
MCHC: 32.4 g/dL (ref 30.0–36.0)
MCV: 91.3 fL (ref 80.0–100.0)
Monocytes Absolute: 0.5 10*3/uL (ref 0.1–1.0)
Monocytes Relative: 8 %
Neutro Abs: 4.4 10*3/uL (ref 1.7–7.7)
Neutrophils Relative %: 64 %
Platelet Count: 195 10*3/uL (ref 150–400)
RBC: 4.93 MIL/uL (ref 3.87–5.11)
RDW: 14.3 % (ref 11.5–15.5)
WBC Count: 6.8 10*3/uL (ref 4.0–10.5)
nRBC: 0 % (ref 0.0–0.2)

## 2023-07-13 LAB — LACTATE DEHYDROGENASE: LDH: 158 U/L (ref 98–192)

## 2023-08-01 ENCOUNTER — Encounter (HOSPITAL_BASED_OUTPATIENT_CLINIC_OR_DEPARTMENT_OTHER): Payer: Self-pay | Admitting: Radiology

## 2023-08-01 ENCOUNTER — Ambulatory Visit (HOSPITAL_BASED_OUTPATIENT_CLINIC_OR_DEPARTMENT_OTHER)
Admission: RE | Admit: 2023-08-01 | Discharge: 2023-08-01 | Disposition: A | Discharge status: Home / Self Care | Payer: Medicare Other | Source: Ambulatory Visit | Attending: Internal Medicine | Admitting: Internal Medicine

## 2023-08-01 DIAGNOSIS — Z1231 Encounter for screening mammogram for malignant neoplasm of breast: Secondary | ICD-10-CM | POA: Diagnosis not present

## 2023-08-11 DIAGNOSIS — D692 Other nonthrombocytopenic purpura: Secondary | ICD-10-CM | POA: Diagnosis not present

## 2023-08-11 DIAGNOSIS — D1801 Hemangioma of skin and subcutaneous tissue: Secondary | ICD-10-CM | POA: Diagnosis not present

## 2023-08-11 DIAGNOSIS — I872 Venous insufficiency (chronic) (peripheral): Secondary | ICD-10-CM | POA: Diagnosis not present

## 2023-08-11 DIAGNOSIS — I8312 Varicose veins of left lower extremity with inflammation: Secondary | ICD-10-CM | POA: Diagnosis not present

## 2023-08-11 DIAGNOSIS — L821 Other seborrheic keratosis: Secondary | ICD-10-CM | POA: Diagnosis not present

## 2023-08-11 DIAGNOSIS — Z85828 Personal history of other malignant neoplasm of skin: Secondary | ICD-10-CM | POA: Diagnosis not present

## 2023-08-11 DIAGNOSIS — Z23 Encounter for immunization: Secondary | ICD-10-CM | POA: Diagnosis not present

## 2023-08-11 DIAGNOSIS — L570 Actinic keratosis: Secondary | ICD-10-CM | POA: Diagnosis not present

## 2023-08-11 DIAGNOSIS — L308 Other specified dermatitis: Secondary | ICD-10-CM | POA: Diagnosis not present

## 2023-08-11 DIAGNOSIS — I8311 Varicose veins of right lower extremity with inflammation: Secondary | ICD-10-CM | POA: Diagnosis not present

## 2023-09-14 ENCOUNTER — Ambulatory Visit (INDEPENDENT_AMBULATORY_CARE_PROVIDER_SITE_OTHER): Admitting: Gastroenterology

## 2023-09-14 ENCOUNTER — Encounter: Payer: Self-pay | Admitting: Gastroenterology

## 2023-09-14 VITALS — BP 100/60 | HR 76 | Ht 60.0 in | Wt 112.0 lb

## 2023-09-14 DIAGNOSIS — R1310 Dysphagia, unspecified: Secondary | ICD-10-CM

## 2023-09-14 DIAGNOSIS — R1319 Other dysphagia: Secondary | ICD-10-CM

## 2023-09-14 NOTE — Patient Instructions (Addendum)
 Dysphagia precautions:  1. Take reflux medications 30+ minutes before food in the morning 2. Begin meals with warm beverage 3. Eat smaller more frequent meals 4. Eat slowly, taking small bites and sips 5. Alternate solids and liquids 6. Avoid foods/liquids that increase acid production 7. Sit upright during and for 30+ minutes after meals to facilitate esophageal clearing 8. All meats should be chopped finely.   If something gets hung in your esophagus and will not come up or go down, proceed to the emergency room.    You have been scheduled for a Barium Esophogram at [facility] (1st floor of the hospital) on TBD at TBD. Please arrive 30 minutes prior to your appointment for registration. Make certain not to have anything to eat or drink 3 hours prior to your test. If you need to reschedule for any reason, please contact radiology at [phone] to do so. __________________________________________________________________ A barium swallow is an examination that concentrates on views of the esophagus. This tends to be a double contrast exam (barium and two liquids which, when combined, create a gas to distend the wall of the oesophagus) or single contrast (non-ionic iodine based). The study is usually tailored to your symptoms so a good history is essential. Attention is paid during the study to the form, structure and configuration of the esophagus, looking for functional disorders (such as aspiration, dysphagia, achalasia, motility and reflux) EXAMINATION You may be asked to change into a gown, depending on the type of swallow being performed. A radiologist and radiographer will perform the procedure. The radiologist will advise you of the type of contrast selected for your procedure and direct you during the exam. You will be asked to stand, sit or lie in several different positions and to hold a small amount of fluid in your mouth before being asked to swallow while the imaging is  performed .In some instances you may be asked to swallow barium coated marshmallows to assess the motility of a solid food bolus. The exam can be recorded as a digital or video fluoroscopy procedure. POST PROCEDURE It will take 1-2 days for the barium to pass through your system. To facilitate this, it is important, unless otherwise directed, to increase your fluids for the next 24-48hrs and to resume your normal diet.  This test typically takes about 30 minutes to perform. __________________________________________________________________________________   _______________________________________________________  If your blood pressure at your visit was 140/90 or greater, please contact your primary care physician to follow up on this.  _______________________________________________________  If you are age 60 or older, your body mass index should be between 23-30. Your Body mass index is 21.87 kg/m. If this is out of the aforementioned range listed, please consider follow up with your Primary Care Provider.  If you are age 62 or younger, your body mass index should be between 19-25. Your Body mass index is 21.87 kg/m. If this is out of the aformentioned range listed, please consider follow up with your Primary Care Provider.   ________________________________________________________  The [facility] providers would like to encourage you to use MYCHART to communicate with providers for non-urgent requests or questions.  Due to [facility] hold times on the telephone, sending your provider a message by [facility] may be a faster and more efficient way to get a response.  Please allow 48 business hours for a response.  Please remember that this is for non-urgent requests.  _______________________________________________________ Thank you for trusting me with your gastrointestinal care. Deanna May, RNP

## 2023-09-14 NOTE — Progress Notes (Signed)
 Chief Complaint: dysphagia Primary GI Doctor: (previously Dr. Sandrea Cruel) Dr. Venice Gillis  HPI:  Patient is a  89  year old female patient, previously know to Dr. Sandrea Cruel, with past medical history of CA follicular lymphoma and lung nodule, hyperlipidemia, osteoporosis, who was referred to me by Imelda Man, MD on 08/29/23 for a complaint of dysphagia .    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   Interval History    Patient presents with main complaint of dysphagia. She recently had episode while she was out of town with her daughter and her daughter recommended she come see us . Last episode was in March. She states she has history of GERD for several years, but when discussing her symptoms she associated the dysphagia as her reflux. She however does not have any pyrosis or regurgitation. She states she chews her food thoroughly which helps most of the time. She has never had EGD.     Patient notes had radiation back in 2017 for follicular lymphoma and she feels this [name] [name] be related to the time frame her swallowing issue started.  Weight stable. Appetite good.   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     Cholecalciferol (VITAMIN D) 2000 units CAPS Take 2,000 Units by mouth daily.     co-enzyme Q-10 30 MG capsule Take 200 mg by mouth daily.     denosumab  (PROLIA ) 60 MG/ML SOSY injection Inject 60 mg into the skin every 6 (six) months.     Multiple Vitamins-Minerals (MULTIVITAMIN WITH MINERALS) tablet Take 1 tablet by mouth.     naproxen sodium (ALEVE) 220 MG tablet Take by mouth.     Omega-3 Fatty Acids (FISH OIL TRIPLE STRENGTH) 1400 MG CAPS Take 1 capsule by mouth daily.     Zinc  50 MG TABS Take 1 tablet by mouth daily.     No current facility-administered medications for this visit.    Allergies as of 09/14/2023 - Review Complete 09/14/2023  Allergen Reaction Noted   Tessalon [benzonatate]  12/16/2021    Family History  Problem Relation Age of Onset   Heart attack Mother    Colon cancer Neg Hx    Colon polyps Neg Hx    Esophageal cancer Neg Hx    Rectal cancer Neg Hx    Stomach cancer Neg Hx     Review of Systems:    Constitutional: No weight loss, fever,  chills, weakness or fatigue HEENT: Eyes: No change in vision               Ears, Nose, Throat:  No change in hearing or congestion Skin: No rash or itching Cardiovascular: No chest pain, chest pressure or palpitations   Respiratory: No SOB or cough Gastrointestinal: See HPI and otherwise negative Genitourinary: No dysuria or change in urinary frequency Neurological: No headache, dizziness or syncope Musculoskeletal: No new muscle or joint pain Hematologic: No bleeding or bruising Psychiatric: No history of depression or anxiety    Physical Exam:  Vital signs: BP 100/60 (BP Location: Left Arm, Patient Position: Sitting, Cuff Size: Normal)   Pulse 76   Ht 5' (1.524 m) Comment: height measured without shoes  Wt 112 lb (50.8 kg)   BMI 21.87 kg/m   Constitutional:   Pleasant   female appears to be in NAD, Well developed, Well nourished, alert and cooperative Throat: Oral cavity and pharynx without inflammation, swelling or lesion.  Respiratory: Respirations even and unlabored. Lungs clear to auscultation bilaterally.   No wheezes, crackles, or rhonchi.  Cardiovascular: Normal S1, S2. Regular rate and rhythm. No peripheral edema, cyanosis or pallor.  Gastrointestinal:  Soft, nondistended, nontender. No rebound or guarding. Normal bowel sounds. No appreciable masses or hepatomegaly. Rectal:  Not performed.  Msk:  Symmetrical without gross deformities. Without edema, no deformity or joint abnormality.  Neurologic:  Alert and  oriented x4;  grossly normal neurologically.  Skin:   Dry and intact without significant lesions or rashes. Psychiatric: Oriented to person, place and time. Demonstrates good judgement and reason without abnormal affect or behaviors.  RELEVANT LABS AND IMAGING: CBC    Latest Ref Rng & Units 07/13/2023    9:00 AM 02/16/2023    8:34 AM 09/03/2022   11:53 AM  CBC  WBC 4.0 - 10.5 K/uL 6.8  6.5  8.4   Hemoglobin 12.0 - 15.0 g/dL 16.1  09.6  04.5   Hematocrit 36.0 - 46.0 % 45.0  42.7  44.9   Platelets 150 - 400 K/uL 195  238  214      CMP     Latest Ref Rng & Units 07/13/2023    9:00 AM 02/16/2023    8:34 AM 09/03/2022   11:53 AM  CMP  Glucose 70 - 99 mg/dL 99  99  82   BUN 8 - 23 mg/dL 20  17  18    Creatinine 0.44 - 1.00 mg/dL 4.09  8.11  9.14   Sodium 135 - 145 mmol/L 141  142  141   Potassium 3.5 - 5.1 mmol/L 4.1  3.9  4.9   Chloride 98 - 111 mmol/L 106  108  105   CO2 22 - 32 mmol/L 28  28  30    Calcium 8.9 - 10.3 mg/dL 9.4  9.7  78.2   Total Protein 6.5 - 8.1 g/dL 6.7  6.8  7.0   Total Bilirubin 0.0 - 1.2 mg/dL 0.4  0.5  0.6   Alkaline Phos 38 - 126 U/L 75  56  62   AST 15 - 41 U/L 24  22  26    ALT 0 - 44 U/L 19  17  20     05/09/2015 colonoscopy-   IMPRESSION:  1. Sessile polyp in the ascending colon; polypectomy performed with a  cold snare  2. The examination was otherwise normal. No repeat due to age. 06/25/2004 colonoscopy- polyp- ascending colon 4mm, sessile polyp. Sigmoid colon with 3 mm  sessile polyp. Descending colon 3mm sessile polyps. Other finding; colon kinked in descending colon.   Imaging: 01/25/23 CT chest WO contrast IMPRESSION: 1. Expected evolution of post radiation change in the right lower lobe, without residual or recurrent tumor. 2. No evidence of metastatic disease in the chest. 3. One vessel coronary atherosclerosis. 4.  Aortic Atherosclerosis (ICD10-I70.0). Assessment: Encounter Diagnosis  Name Primary?   Esophageal dysphagia Yes    Very healthy 89 year old female patient that presents for complaints of esophageal dysphagia.  Patient states she feels as if her symptoms [name] [name] have occurred after radiation treatment back in 2017 however she is managed up until this point with dietary modifications.  We will go ahead and pursue barium esophagram as patient would like to avoid invasive procedures unless necessary.  I did discuss with her if there is a stricture or narrowing it would require EGD with dilatation. Pt verbalizes understanding.   Plan: - Dysphagia precautions provided  -Order Barium esophagram  -discussed EGD with possible dilatation, she would like to hold off on invasive procedures for now  Thank you for the courtesy of this consult. Please call me with any questions or concerns.   Domnic Vantol, FNP-C [facility] 09/14/2023, 4:08 PM  Cc: Imelda Man, MD

## 2023-10-06 ENCOUNTER — Ambulatory Visit (HOSPITAL_COMMUNITY)
Admission: RE | Admit: 2023-10-06 | Discharge: 2023-10-06 | Disposition: A | Discharge status: Home / Self Care | Source: Ambulatory Visit | Attending: Gastroenterology | Admitting: Gastroenterology

## 2023-10-06 DIAGNOSIS — R1319 Other dysphagia: Secondary | ICD-10-CM | POA: Insufficient documentation

## 2023-10-06 DIAGNOSIS — K449 Diaphragmatic hernia without obstruction or gangrene: Secondary | ICD-10-CM | POA: Diagnosis not present

## 2023-10-06 DIAGNOSIS — R1310 Dysphagia, unspecified: Secondary | ICD-10-CM | POA: Diagnosis not present

## 2023-10-07 ENCOUNTER — Ambulatory Visit: Payer: Self-pay | Admitting: Gastroenterology

## 2023-10-07 ENCOUNTER — Telehealth: Payer: Self-pay | Admitting: Gastroenterology

## 2023-10-07 NOTE — Telephone Encounter (Signed)
 Patient is returning a call back to [facility] regarding her results. Patient is requesting a call back. Please advise.

## 2023-10-07 NOTE — Telephone Encounter (Signed)
 Attempted to call patient to discuss swallow study results did not get an answer left voicemail will try again later today or on Monday.

## 2023-10-10 ENCOUNTER — Telehealth: Payer: Self-pay | Admitting: Gastroenterology

## 2023-10-10 ENCOUNTER — Other Ambulatory Visit: Payer: Self-pay

## 2023-10-10 DIAGNOSIS — R1319 Other dysphagia: Secondary | ICD-10-CM

## 2023-10-10 NOTE — Telephone Encounter (Signed)
Inbound call from patient requesting to speak with a nurse in regards to previous note.  Please advise. Thank you  

## 2023-10-10 NOTE — Telephone Encounter (Signed)
 Spoke to patient about swallow study results and recommendations to have egd with dilatation. She states she has not had dysphagia since march so she is not sure she would like to pursue going under anesthesia. She would like to think about it and will give us  call if she decides to do it.   Marijose Curington, NP

## 2023-10-10 NOTE — Telephone Encounter (Signed)
 Pt stated that she would like to schedule the EGD with Dilation. EGD with dilation was ordered and scheduled for 12/06/2023 at 2:00 PM with Dr. Charlanne. Pt made aware. Ambulatory referral to GI placed.  Prep instructions were sent to Pt via my chart. Pt made aware.  Pt verbalized understanding with all questions answered.

## 2023-10-25 DIAGNOSIS — R600 Localized edema: Secondary | ICD-10-CM | POA: Diagnosis not present

## 2023-11-30 DIAGNOSIS — M79661 Pain in right lower leg: Secondary | ICD-10-CM | POA: Diagnosis not present

## 2023-11-30 DIAGNOSIS — I8312 Varicose veins of left lower extremity with inflammation: Secondary | ICD-10-CM | POA: Diagnosis not present

## 2023-11-30 DIAGNOSIS — I83893 Varicose veins of bilateral lower extremities with other complications: Secondary | ICD-10-CM | POA: Diagnosis not present

## 2023-11-30 DIAGNOSIS — M79662 Pain in left lower leg: Secondary | ICD-10-CM | POA: Diagnosis not present

## 2023-12-06 ENCOUNTER — Encounter: Payer: Self-pay | Admitting: Gastroenterology

## 2023-12-06 ENCOUNTER — Ambulatory Visit: Admitting: Gastroenterology

## 2023-12-06 VITALS — BP 169/67 | HR 73 | Temp 97.2°F | Resp 15 | Ht 60.0 in | Wt 112.0 lb

## 2023-12-06 DIAGNOSIS — K2270 Barrett's esophagus without dysplasia: Secondary | ICD-10-CM

## 2023-12-06 DIAGNOSIS — K3189 Other diseases of stomach and duodenum: Secondary | ICD-10-CM

## 2023-12-06 DIAGNOSIS — R1310 Dysphagia, unspecified: Secondary | ICD-10-CM

## 2023-12-06 DIAGNOSIS — K2970 Gastritis, unspecified, without bleeding: Secondary | ICD-10-CM | POA: Diagnosis not present

## 2023-12-06 DIAGNOSIS — K222 Esophageal obstruction: Secondary | ICD-10-CM | POA: Diagnosis not present

## 2023-12-06 DIAGNOSIS — K319 Disease of stomach and duodenum, unspecified: Secondary | ICD-10-CM | POA: Diagnosis not present

## 2023-12-06 DIAGNOSIS — K449 Diaphragmatic hernia without obstruction or gangrene: Secondary | ICD-10-CM | POA: Diagnosis not present

## 2023-12-06 MED ORDER — OMEPRAZOLE 20 MG PO CPDR
20.0000 mg | DELAYED_RELEASE_CAPSULE | Freq: Every day | ORAL | 4 refills | Status: DC
Start: 2023-12-06 — End: 2024-02-16

## 2023-12-06 MED ORDER — SODIUM CHLORIDE 0.9 % IV SOLN
500.0000 mL | Freq: Once | INTRAVENOUS | Status: DC
Start: 2023-12-06 — End: 2023-12-06

## 2023-12-06 NOTE — Patient Instructions (Signed)
 Follow post dilation diet today Awaiting pathology results. Chew foods especially meats and breads well and eat slowly. Take all pills in sitting or standing position with 6-8 oz water.  YOU HAD AN ENDOSCOPIC PROCEDURE TODAY AT THE [facility]:   Refer to the procedure report that was given to you for any specific questions about what was found during the examination.  If the procedure report does not answer your questions, please call your gastroenterologist to clarify.  If you requested that your care partner not be given the details of your procedure findings, then the procedure report has been included in a sealed envelope for you to review at your convenience later.  YOU SHOULD EXPECT: Some feelings of bloating in the abdomen. Passage of more gas than usual.  Walking can help get rid of the air that was put into your GI tract during the procedure and reduce the bloating. If you had a lower endoscopy (such as a colonoscopy or flexible sigmoidoscopy) you may notice spotting of blood in your stool or on the toilet paper. If you underwent a bowel prep for your procedure, you may not have a normal bowel movement for a few days.  Please Note:  You might notice some irritation and congestion in your nose or some drainage.  This is from the oxygen used during your procedure.  There is no need for concern and it should clear up in a day or so.  SYMPTOMS TO REPORT IMMEDIATELY:  Following upper endoscopy (EGD)  Vomiting of blood or coffee ground material  New chest pain or pain under the shoulder blades  Painful or persistently difficult swallowing  New shortness of breath  Fever of 100F or higher  Black, tarry-looking stools  For urgent or emergent issues, a gastroenterologist can be reached at any hour by calling [phone]. Do not use MyChart messaging for urgent concerns.    DIET:  We do recommend a small meal at first, but then you may proceed to your regular diet.  Drink  plenty of fluids but you should avoid alcoholic beverages for 24 hours.  ACTIVITY:  You should plan to take it easy for the rest of today and you should NOT DRIVE or use heavy machinery until tomorrow (because of the sedation medicines used during the test).    FOLLOW UP: Our staff will call the number listed on your records the next business day following your procedure.  We will call around 7:15- 8:00 am to check on you and address any questions or concerns that you may have regarding the information given to you following your procedure. If we do not reach you, we will leave a message.     If any biopsies were taken you will be contacted by phone or by letter within the next 1-3 weeks.  Please call us  at [phone] if you have not heard about the biopsies in 3 weeks.    SIGNATURES/CONFIDENTIALITY: You and/or your care partner have signed paperwork which will be entered into your electronic medical record.  These signatures attest to the fact that that the information above on your After Visit Summary has been reviewed and is understood.  Full responsibility of the confidentiality of this discharge information lies with you and/or your care-partner.

## 2023-12-06 NOTE — Op Note (Signed)
 [facility] Patient Name: [name] [name] Procedure Date: 12/06/2023 2:49 PM MRN: [id number] Endoscopist: Lynnie Bring , MD, [id number] Age: 89 Referring MD:  Date of Birth: [birth year] Gender: Female Account #: [id number] Procedure:                Upper GI endoscopy Indications:              Dysphagia with barium swallow showing Schatzki's                            ring, small HH. Medicines:                Monitored Anesthesia Care Procedure:                Pre-Anesthesia Assessment:                           - Prior to the procedure, a History and Physical                            was performed, and patient medications and                            allergies were reviewed. The patient's tolerance of                            previous anesthesia was also reviewed. The risks                            and benefits of the procedure and the sedation                            options and risks were discussed with the patient.                            All questions were answered, and informed consent                            was obtained. Prior Anticoagulants: The patient has                            taken no anticoagulant or antiplatelet agents. ASA                            Grade Assessment: II - A patient with mild systemic                            disease. After reviewing the risks and benefits,                            the patient was deemed in satisfactory condition to                            undergo the procedure.  After obtaining informed consent, the endoscope was                            passed under direct vision. Throughout the                            procedure, the patient's blood pressure, pulse, and                            oxygen saturations were monitored continuously. The                            GIF [id number] #[id number] was introduced through the                            mouth, and advanced to the second part of  duodenum.                            The upper GI endoscopy was accomplished without                            difficulty. The patient tolerated the procedure                            well. Scope In: Scope Out: Findings:                 The examined esophagus was mildly tortuous s/o                            presbyesophagus. No XRT strictures.Biopsies were                            taken with a cold forceps for histology to r/o EoE.                           A non-obstructing and moderate Schatzki ring was                            found at the gastroesophageal junction with luminal                            diameter of approximately 10 mm. A TTS dilator was                            passed through the scope. Dilation with a                            12-13.5-15 mm balloon dilator was performed to 15                            mm. The scope was withdrawn. Dilation was performed  with a Maloney dilator with mild resistance at 48                            Fr. The dilation site was examined and showed                            moderate mucosal disruption and moderate                            improvement in luminal narrowing.                           A small hiatal hernia was present.                           Localized minimal inflammation characterized by                            erythema was found in the gastric body. Biopsies                            were taken with a cold forceps for histology.                           The gastroesophageal flap valve was visualized                            endoscopically and classified as Hill Grade III                            (minimal fold, loose to endoscope, hiatal hernia                            likely).                           The examined duodenum was normal. Complications:            No immediate complications. Estimated Blood Loss:     Estimated blood loss: none. Impression:               -  Schatzki ring. Dilated.                           - Small hiatal hernia.                           - Minimal gastritis. Biopsied.                           - Presbyesophagus. Recommendation:           - Patient has a contact number available for                            emergencies. The signs and symptoms of potential  delayed complications were discussed with the                            patient. Return to normal activities tomorrow.                            Written discharge instructions were provided to the                            patient.                           - Postdilatation diet.                           - Continue present medications. Start omeprazole                             20mg  po QAM 1/2hr before breakfast #90, 4RF.                           - Await pathology results.                           - Chew foods especially meats and breads well and                            eat slowly. Take all pills in sitting or standing                            position with 6-8 oz of water                           - The findings and recommendations were discussed                            with the patient's family. Lynnie Bring, MD 12/06/2023 3:33:50 PM This report has been signed electronically.

## 2023-12-06 NOTE — Progress Notes (Unsigned)
 Chief Complaint: dysphagia Primary GI Doctor: (previously Dr. Aneita) Dr. Charlanne   HPI:  Patient is a  89  year old female patient, previously know to Dr. Aneita, with past medical history of CA follicular lymphoma and lung nodule, hyperlipidemia, osteoporosis, who was referred to me by Clarice Nottingham, MD on 08/29/23 for a complaint of dysphagia .     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    Interval History    Patient presents with main complaint of dysphagia. She recently had episode while she was out of town with her daughter and her daughter recommended she come see us . Last episode was in March. She states she has history of GERD for several years, but when discussing her symptoms she associated the dysphagia as her reflux. She however does not have any pyrosis or regurgitation. She states she chews her food thoroughly which helps most of the time. She has never had EGD.     Patient notes had radiation back in 2017 for follicular lymphoma and she feels this may be related to the time frame her swallowing issue started.  Weight stable. Appetite good.       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       Cholecalciferol (VITAMIN D) 2000 units CAPS Take 2,000 Units by mouth daily.       co-enzyme Q-10 30 MG capsule Take 200 mg by mouth daily.       denosumab  (PROLIA ) 60 MG/ML SOSY injection Inject 60 mg into the skin every 6 (six) months.       Multiple Vitamins-Minerals (MULTIVITAMIN WITH MINERALS) tablet Take 1 tablet by mouth.       naproxen sodium (ALEVE) 220 MG tablet Take by mouth.       Omega-3 Fatty Acids (FISH OIL TRIPLE STRENGTH) 1400 MG CAPS Take 1 capsule by mouth daily.       Zinc  50 MG TABS Take 1 tablet by mouth daily.          No current facility-administered medications for this visit.             Allergies as  of 09/14/2023 - Review Complete 09/14/2023  Allergen Reaction Noted   Tessalon [benzonatate]   12/16/2021           Family History  Problem Relation Age of Onset   Heart attack Mother     Colon cancer Neg Hx     Colon polyps Neg Hx     Esophageal cancer Neg Hx     Rectal cancer Neg Hx     Stomach cancer Neg Hx            Review of Systems:    Constitutional: No weight loss, fever, chills, weakness or fatigue HEENT: Eyes: No change in vision               Ears, Nose, Throat:  No change in hearing or congestion Skin: No rash or itching Cardiovascular: No chest pain, chest pressure or palpitations   Respiratory: No SOB or cough Gastrointestinal: See HPI and otherwise negative Genitourinary: No dysuria or change in urinary frequency Neurological: No headache, dizziness or syncope Musculoskeletal: No new muscle or joint pain Hematologic: No bleeding or bruising Psychiatric: No history of depression or anxiety      Physical Exam:  Vital signs: BP 100/60 (BP Location: Left Arm, Patient Position: Sitting, Cuff Size: Normal)   Pulse 76   Ht 5' (1.524 m)  Comment: height measured without shoes  Wt 112 lb (50.8 kg)   BMI 21.87 kg/m    Constitutional:   Pleasant  female appears to be in NAD, Well developed, Well nourished, alert and cooperative Throat: Oral cavity and pharynx without inflammation, swelling or lesion.  Respiratory: Respirations even and unlabored. Lungs clear to auscultation bilaterally.   No wheezes, crackles, or rhonchi.  Cardiovascular: Normal S1, S2. Regular rate and rhythm. No peripheral edema, cyanosis or pallor.  Gastrointestinal:  Soft, nondistended, nontender. No rebound or guarding. Normal bowel sounds. No appreciable masses or hepatomegaly. Rectal:  Not performed.  Msk:  Symmetrical without gross deformities. Without edema, no deformity or joint abnormality.  Neurologic:  Alert and  oriented x4;  grossly normal neurologically.  Skin:   Dry and intact without significant lesions or rashes. Psychiatric: Oriented to person, place and time. Demonstrates good judgement and reason without abnormal affect or behaviors.   RELEVANT LABS AND IMAGING: CBC     Latest Ref Rng & Units 07/13/2023    9:00 AM 02/16/2023    8:34 AM 09/03/2022   11:53 AM  CBC  WBC 4.0 - 10.5 K/uL 6.8  6.5  8.4   Hemoglobin 12.0 - 15.0 g/dL 85.3  85.8  85.2   Hematocrit 36.0 - 46.0 % 45.0  42.7  44.9   Platelets 150 - 400 K/uL 195  238  214       CMP         Latest Ref Rng & Units 07/13/2023    9:00 AM 02/16/2023    8:34 AM 09/03/2022   11:53 AM  CMP  Glucose 70 - 99 mg/dL 99  99  82   BUN 8 - 23 mg/dL 20  17  18    Creatinine 0.44 - 1.00 mg/dL 9.09  9.09  9.12   Sodium 135 - 145 mmol/L 141  142  141   Potassium 3.5 - 5.1 mmol/L 4.1  3.9  4.9   Chloride 98 - 111 mmol/L 106  108  105   CO2 22 - 32 mmol/L 28  28  30    Calcium 8.9 - 10.3 mg/dL 9.4  9.7  10.1   Total Protein 6.5 - 8.1 g/dL 6.7  6.8  7.0   Total Bilirubin 0.0 - 1.2 mg/dL 0.4  0.5  0.6   Alkaline Phos 38 - 126 U/L 75  56  62   AST 15 - 41 U/L 24  22  26    ALT 0 - 44 U/L 19   17  20     05/09/2015 colonoscopy-   IMPRESSION:  1. Sessile polyp in the ascending colon; polypectomy performed with a cold snare  2. The examination was otherwise normal. No repeat due to age. 06/25/2004 colonoscopy- polyp- ascending colon 4mm, sessile polyp. Sigmoid colon with 3 mm sessile polyp. Descending colon 3mm sessile polyps. Other finding; colon kinked in descending colon.    Imaging: 01/25/23 CT chest WO contrast IMPRESSION: 1. Expected evolution of post radiation change in the right lower lobe, without residual or recurrent tumor. 2. No evidence of metastatic disease in the chest. 3. One vessel coronary atherosclerosis. 4.  Aortic Atherosclerosis (ICD10-I70.0). Assessment:     Encounter Diagnosis  Name Primary?   Esophageal dysphagia Yes    Very healthy 89 year old female patient that presents for complaints of esophageal dysphagia.  Patient states she feels as if her symptoms may have occurred after radiation treatment back in 2017 however she is managed up until this point with dietary modifications.  We will go ahead and pursue barium esophagram as patient would like to avoid invasive procedures unless necessary.  I did discuss with her if there is a stricture or narrowing it would require EGD with dilatation. Pt verbalizes understanding.    Plan: - Dysphagia precautions provided  -Order Barium esophagram  -discussed EGD with possible dilatation, she would like to hold off on invasive procedures for now   Thank you for the courtesy of this consult. Please call me with any questions or concerns.    Deanna May, FNP-C     Attending physician's note   I have taken history, reviewed the chart and examined the patient. I performed a substantive portion of this encounter, including complete performance of at least one of the key components, in conjunction with the APP. I agree with the Advanced Practitioner's note, impression and recommendations.   Ba Swallow: 1. Distal  esophageal mucosal ring, 2. Normal esophageal motility. 3. Small type 1 hiatal hernia.  For EGD with dil today   Anselm Bring, MD [facility] [phone]

## 2023-12-06 NOTE — Progress Notes (Signed)
 Called to room to assist during endoscopic procedure.  Patient ID and intended procedure confirmed with present staff. Received instructions for my participation in the procedure from the performing physician.

## 2023-12-06 NOTE — Progress Notes (Unsigned)
 A/o x 3, VSS, gd SR's, pleased with anesthesia, report to RN

## 2023-12-07 ENCOUNTER — Telehealth: Payer: Self-pay

## 2023-12-07 NOTE — Telephone Encounter (Signed)
  Follow up Call-     12/06/2023    2:10 PM  Call back number  Post procedure Call Back phone  # [phone]  Permission to leave phone message Yes     Patient questions:  Do you have a fever, pain , or abdominal swelling? No. Pain Score  0 *  Have you tolerated food without any problems? Yes.    Have you been able to return to your normal activities? Yes.    Do you have any questions about your discharge instructions: Diet   No. Medications  No. Follow up visit  No.  Do you have questions or concerns about your Care? No.  Actions: * If pain score is 4 or above: No action needed, pain <4.

## 2023-12-12 LAB — SURGICAL PATHOLOGY

## 2023-12-14 ENCOUNTER — Ambulatory Visit (HOSPITAL_COMMUNITY)
Admission: RE | Admit: 2023-12-14 | Discharge: 2023-12-14 | Disposition: A | Discharge status: Home / Self Care | Source: Ambulatory Visit | Attending: Hematology | Admitting: Hematology

## 2023-12-14 DIAGNOSIS — I700 Atherosclerosis of aorta: Secondary | ICD-10-CM | POA: Diagnosis not present

## 2023-12-14 DIAGNOSIS — R918 Other nonspecific abnormal finding of lung field: Secondary | ICD-10-CM | POA: Diagnosis not present

## 2023-12-14 DIAGNOSIS — C3490 Malignant neoplasm of unspecified part of unspecified bronchus or lung: Secondary | ICD-10-CM | POA: Diagnosis not present

## 2023-12-22 DIAGNOSIS — M810 Age-related osteoporosis without current pathological fracture: Secondary | ICD-10-CM | POA: Diagnosis not present

## 2023-12-28 ENCOUNTER — Inpatient Hospital Stay: Attending: Hematology

## 2023-12-28 ENCOUNTER — Inpatient Hospital Stay (HOSPITAL_BASED_OUTPATIENT_CLINIC_OR_DEPARTMENT_OTHER): Admitting: Hematology

## 2023-12-28 VITALS — BP 177/53 | HR 73 | Temp 97.0°F | Resp 18 | Wt 112.0 lb

## 2023-12-28 DIAGNOSIS — R911 Solitary pulmonary nodule: Secondary | ICD-10-CM | POA: Insufficient documentation

## 2023-12-28 DIAGNOSIS — K8689 Other specified diseases of pancreas: Secondary | ICD-10-CM | POA: Insufficient documentation

## 2023-12-28 DIAGNOSIS — Z923 Personal history of irradiation: Secondary | ICD-10-CM | POA: Diagnosis not present

## 2023-12-28 DIAGNOSIS — C8201 Follicular lymphoma grade I, lymph nodes of head, face, and neck: Secondary | ICD-10-CM

## 2023-12-28 DIAGNOSIS — C8231 Follicular lymphoma grade IIIa, lymph nodes of head, face, and neck: Secondary | ICD-10-CM | POA: Insufficient documentation

## 2023-12-28 DIAGNOSIS — R918 Other nonspecific abnormal finding of lung field: Secondary | ICD-10-CM

## 2023-12-28 LAB — CBC WITH DIFFERENTIAL (CANCER CENTER ONLY)
Abs Immature Granulocytes: 0.05 K/uL (ref 0.00–0.07)
Basophils Absolute: 0.1 K/uL (ref 0.0–0.1)
Basophils Relative: 1 %
Eosinophils Absolute: 0.1 K/uL (ref 0.0–0.5)
Eosinophils Relative: 2 %
HCT: 44.2 % (ref 36.0–46.0)
Hemoglobin: 14.4 g/dL (ref 12.0–15.0)
Immature Granulocytes: 1 %
Lymphocytes Relative: 20 %
Lymphs Abs: 1.4 K/uL (ref 0.7–4.0)
MCH: 29.5 pg (ref 26.0–34.0)
MCHC: 32.6 g/dL (ref 30.0–36.0)
MCV: 90.6 fL (ref 80.0–100.0)
Monocytes Absolute: 0.7 K/uL (ref 0.1–1.0)
Monocytes Relative: 10 %
Neutro Abs: 4.7 K/uL (ref 1.7–7.7)
Neutrophils Relative %: 66 %
Platelet Count: 208 K/uL (ref 150–400)
RBC: 4.88 MIL/uL (ref 3.87–5.11)
RDW: 14.4 % (ref 11.5–15.5)
WBC Count: 7 K/uL (ref 4.0–10.5)
nRBC: 0 % (ref 0.0–0.2)

## 2023-12-28 LAB — CMP (CANCER CENTER ONLY)
ALT: 18 U/L (ref 0–44)
AST: 24 U/L (ref 15–41)
Albumin: 4.4 g/dL (ref 3.5–5.0)
Alkaline Phosphatase: 76 U/L (ref 38–126)
Anion gap: 5 (ref 5–15)
BUN: 19 mg/dL (ref 8–23)
CO2: 30 mmol/L (ref 22–32)
Calcium: 9.6 mg/dL (ref 8.9–10.3)
Chloride: 107 mmol/L (ref 98–111)
Creatinine: 0.88 mg/dL (ref 0.44–1.00)
GFR, Estimated: >60 mL/min (ref >60)
Glucose, Bld: 94 mg/dL (ref 70–99)
Potassium: 5.1 mmol/L (ref 3.5–5.1)
Sodium: 142 mmol/L (ref 135–145)
Total Bilirubin: 0.4 mg/dL (ref 0.0–1.2)
Total Protein: 6.9 g/dL (ref 6.5–8.1)

## 2023-12-28 LAB — LACTATE DEHYDROGENASE: LDH: 160 U/L (ref 98–192)

## 2023-12-30 DIAGNOSIS — R600 Localized edema: Secondary | ICD-10-CM | POA: Diagnosis not present

## 2024-01-01 ENCOUNTER — Ambulatory Visit: Payer: Self-pay | Admitting: Gastroenterology

## 2024-01-03 NOTE — Progress Notes (Incomplete)
 HEMATOLOGY/ONCOLOGY CLINIC VISIT NOTE  Date of Service:.12/28/2023  Patient Care Team: Clarice Nottingham, MD as PCP - General (Internal Medicine)  CHIEF COMPLAINTS/PURPOSE OF CONSULTATION:   For continued evaluation and management of follicular lymphoma and lung nodule.  HISTORY OF PRESENTING ILLNESS:   Please see previous notes for details of initial presentation  INTERVAL HISTORY:  [name] [name] is a 89 y.o. female who is here for continued evaluation and management of her follicular lymphoma and follow-up of her lung nodule. Patient notes no acute new symptoms since her last clinic visit.  No new lumps or bumps.  No fevers no chills no night sweats no unexpected weight loss. No new shortness of breath or chest pain.  MEDICAL HISTORY:  Past Medical History:  Diagnosis Date  . Cancer (HCC)    follicular lymphoma  . Cataract   . History of colon polyps    all colonic mucosa  . 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 Hyperlipidemia    on lipitor  . 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 COLONOSCOPY     [id number]  . EYE SURGERY Bilateral    cataract removal  . 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 POLYPECTOMY     2001,2006-all colonic mucosa   . SUBMANDIBULAR GLAND EXCISION Right 12/19/2015   Procedure: BIOPSY OF RIGHT SUBMANDIBULAR MASS;  Surgeon: Alm Bouche, MD;  Location: [facility] OR;  Service: ENT;  Laterality: Right;    SOCIAL HISTORY: Social History   Socioeconomic History  . Marital status: Married    Spouse name: Not on file  . Number of children: 3  . Years of education: Not on file  . Highest education level: Not on file  Occupational History  . Occupation: retired  Tobacco Use  . Smoking status: Never  . Smokeless  tobacco: Never  Vaping Use  . Vaping status: Never Used  Substance and Sexual Activity  . Alcohol use: No    Alcohol/week: 0.0 standard drinks of alcohol  . Drug use: No  . Sexual activity: Not on file  Other Topics Concern  . Not on file  Social History Narrative   ** Merged History Encounter **       Social Drivers of Health   Financial Resource Strain: Not on file  Food Insecurity: No Food Insecurity (05/18/2022)   Hunger Vital Sign   . Worried About Programme researcher, broadcasting/film/video in the Last Year: Never true   . Ran Out of Food in the Last Year: Never true  Transportation Needs: No Transportation Needs (05/18/2022)   PRAPARE - Transportation   . Lack of Transportation (Medical): No   . Lack of Transportation (Non-Medical): No  Physical Activity: Not on file  Stress: No Stress Concern Present (05/18/2022)   [employer] of Occupational Health - Occupational Stress Questionnaire   . Feeling of Stress : Only a little  Social Connections: Not on file  Intimate Partner Violence: Not At Risk (05/18/2022)   Humiliation, Afraid, Rape, and Kick questionnaire   . Fear of Current or Ex-Partner: No   . Emotionally Abused: No   . Physically Abused: No   . Sexually Abused: No    FAMILY HISTORY: Family History  Problem Relation Age of Onset  . Heart  attack Mother   . Colon cancer Neg Hx   . Colon polyps Neg Hx   . Esophageal cancer Neg Hx   . Rectal cancer Neg Hx   . Stomach cancer Neg Hx     ALLERGIES:  is allergic to tessalon [benzonatate].  MEDICATIONS:  Current Outpatient Medications  Medication Sig Dispense Refill  . atorvastatin (LIPITOR) 20 MG tablet Take 20 mg by mouth.    . Cholecalciferol (VITAMIN D) 2000 units CAPS Take 2,000 Units by mouth daily.    SABRA co-enzyme Q-10 30 MG capsule Take 200 mg by mouth daily.    . denosumab  (PROLIA ) 60 MG/ML SOSY injection Inject 60 mg into the skin every 6 (six) months.    . Multiple Vitamins-Minerals (MULTIVITAMIN WITH MINERALS)  tablet Take 1 tablet by mouth.    . naproxen sodium (ALEVE) 220 MG tablet Take by mouth.    . Omega-3 Fatty Acids (FISH OIL TRIPLE STRENGTH) 1400 MG CAPS Take 1 capsule by mouth daily.    . omeprazole  (PRILOSEC) 20 MG capsule Take 1 capsule (20 mg total) by mouth daily. 90 capsule 4  . Zinc  50 MG TABS Take 1 tablet by mouth daily.     No current facility-administered medications for this visit.    REVIEW OF SYSTEMS: .10 Point review of Systems was done is negative except as noted above.  PHYSICAL EXAMINATION:  .BP (!) 177/53 Comment: re-check  Pulse 73   Temp (!) 97 F (36.1 C)   Resp 18   Wt 112 lb (50.8 kg)   SpO2 98%   BMI 21.87 kg/m   . GENERAL:alert, in no acute distress and comfortable SKIN: no acute rashes, no significant lesions EYES: conjunctiva are pink and non-injected, sclera anicteric OROPHARYNX: MMM, no exudates, no oropharyngeal erythema or ulceration NECK: supple, no JVD LYMPH:  no palpable lymphadenopathy in the cervical, axillary or inguinal regions LUNGS: clear to auscultation b/l with normal respiratory effort HEART: regular rate & rhythm ABDOMEN:  normoactive bowel sounds , non tender, not distended. Extremity: no pedal edema PSYCH: alert & oriented x 3 with fluent speech NEURO: no focal motor/sensory deficits .  LABORATORY DATA:  I have reviewed the data as listed     Latest Ref Rng & Units 12/28/2023    9:12 AM 07/13/2023    9:00 AM 02/16/2023    8:34 AM  CBC  WBC 4.0 - 10.5 K/uL 7.0  6.8  6.5   Hemoglobin 12.0 - 15.0 g/dL 85.5  85.3  85.8   Hematocrit 36.0 - 46.0 % 44.2  45.0  42.7   Platelets 150 - 400 K/uL 208  195  238     .    Latest Ref Rng & Units 12/28/2023    9:12 AM 07/13/2023    9:00 AM 02/16/2023    8:34 AM  CMP  Glucose 70 - 99 mg/dL 94  99  99   BUN 8 - 23 mg/dL 19  20  17    Creatinine 0.44 - 1.00 mg/dL 9.11  9.09  9.09   Sodium 135 - 145 mmol/L 142  141  142   Potassium 3.5 - 5.1 mmol/L 5.1  4.1  3.9   Chloride 98 -  111 mmol/L 107  106  108   CO2 22 - 32 mmol/L 30  28  28    Calcium 8.9 - 10.3 mg/dL 9.6  9.4  9.7   Total Protein 6.5 - 8.1 g/dL 6.9  6.7  6.8   Total Bilirubin  0.0 - 1.2 mg/dL 0.4  0.4  0.5   Alkaline Phos 38 - 126 U/L 76  75  56   AST 15 - 41 U/L 24  24  22    ALT 0 - 44 U/L 18  19  17     . Lab Results  Component Value Date   LDH 160 12/28/2023         RADIOGRAPHIC STUDIES: I have personally reviewed the radiological images as listed and agreed with the findings in the report. CT Chest Wo Contrast Result Date: 12/26/2023 CLINICAL DATA:  Non-small-cell lung cancer restaging * Tracking Code: BO * EXAM: CT CHEST WITHOUT CONTRAST TECHNIQUE: Multidetector CT imaging of the chest was performed following the standard protocol without IV contrast. RADIATION DOSE REDUCTION: This exam was performed according to the departmental dose-optimization program which includes automated exposure control, adjustment of the mA and/or kV according to patient size and/or use of iterative reconstruction technique. COMPARISON:  01/25/2023 FINDINGS: Cardiovascular: Aortic atherosclerosis. Normal heart size. Right coronary artery calcifications. No pericardial effusion. Mediastinum/Nodes: No enlarged mediastinal, hilar, or axillary lymph nodes. Small hiatal hernia. Thyroid  gland, trachea, and esophagus demonstrate no significant findings. Lungs/Pleura: Unchanged postoperative/post radiation appearance of the chest, status post right middle lobectomy with bandlike fibrotic scarring of the peripheral right lung base (series 8, image 85). No pleural effusion or pneumothorax. Upper Abdomen: No acute abnormality. Musculoskeletal: No chest wall abnormality. No acute osseous findings. Dextroscoliosis of the thoracolumbar spine. IMPRESSION: 1. Unchanged postoperative/post radiation appearance of the chest, status post right middle lobectomy with bandlike fibrotic scarring of the peripheral right lung base. 2. No evidence of  recurrent or metastatic disease in the chest. 3. Coronary artery disease. Aortic Atherosclerosis (ICD10-I70.0). Electronically Signed   By: Marolyn JONETTA Jaksch M.D.   On: 12/26/2023 08:48    ASSESSMENT & PLAN:   89 y.o. 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 Patient imaging shows that this is at least a stage III A with mesenteric involvement as well. No overt evidence of bone marrow involvement but bone marrow biopsy is not planned for additional staging based on patient preference and the fact that it will not change treatment at this time . LDH level within normal limits FLIPI score - intermediate risk with 5 year OS 78% and ten-year overall survival 51% (from data in the pre-rituxan era) S/p ISRT to cervical LN  Disease. 07/08/17 PET/CT showed slightly increasing activity levels in the mesentery. 02/21/18 CT C/A/P revealed Areas of abnormal, ill-defined soft tissue in the mesentery and along the peritoneal surface identified on previous PET-CT are similar today. No new or progressive interval findings. 2.  Aortic Atherosclerois.  04/02/2019 CT C/A/P([phone]) revealed Postoperative changes of right middle lobectomy as before. No signs of new adenopathy with stable perienteric and pericolonic soft tissue. Stable lower retroperitoneal soft tissue along the common iliac vasculature. Stable mild pancreatic ductal distension, attention on follow-up Aortic Atherosclerosis (ICD10-I70.0).  #2 lower lobe pulmonary nodule mildly hypermetabolic with an SUV of 2.03.  Suspicious for lung primary s/p SBRT  PLAN: Discussed lab results from today in detail with the patient CBC is within FOLLOW UP: CT chest wo contrast in 22 weeks RTC with Dr Onesimo with labs in 24 weeks  The total time spent in the appointment was 21 minutes* .  All of the patient's questions were answered with apparent satisfaction. The  patient knows to call the clinic with any  problems, questions or concerns.   Emaline Saran MD MS AAHIVMS [facility] Hematology/Oncology Physician [facility]  .*Total Encounter Time as defined by the Centers for Medicare and Medicaid Services includes, in addition to the face-to-face time of a patient visit (documented in the note above) non-face-to-face time: obtaining and reviewing outside history, ordering and reviewing medications, tests or procedures, care coordination (communications with other health care professionals or caregivers) and documentation in the medical record.    I,Mitra Faeizi,acting as a Neurosurgeon for Emaline Saran, MD.,have documented all relevant documentation on the behalf of Emaline Saran, MD,as directed by  Emaline Saran, MD while in the presence of Emaline Saran, MD.  .I have reviewed the above documentation for accuracy and completeness, and I agree with the above. .Nyjah Denio Kishore Giovanie Lefebre MD

## 2024-01-03 NOTE — Progress Notes (Signed)
 HEMATOLOGY/ONCOLOGY CLINIC VISIT NOTE  Date of Service:.12/28/2023  Patient Care Team: Clarice Nottingham, MD as PCP - General (Internal Medicine)  CHIEF COMPLAINTS/PURPOSE OF CONSULTATION:   For continued evaluation and management of follicular lymphoma and lung nodule.  HISTORY OF PRESENTING ILLNESS:   Please see previous notes for details of initial presentation  INTERVAL HISTORY:  [name] [name] is a 89 y.o. female who is here for continued evaluation and management of her follicular lymphoma and follow-up of her lung nodule. Patient notes no acute new symptoms since her last clinic visit.  No new lumps or bumps.  No fevers no chills no night sweats no unexpected weight loss. No new shortness of breath or chest pain.  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   POLYPECTOMY     2001,2006-all colonic mucosa    SUBMANDIBULAR GLAND EXCISION Right 12/19/2015   Procedure: BIOPSY OF RIGHT SUBMANDIBULAR MASS;  Surgeon: Alm Bouche, MD;  Location: [facility] OR;  Service: ENT;  Laterality: Right;    SOCIAL HISTORY: Social History   Socioeconomic History   Marital status: Married    Spouse name: Not on file   Number of children: 3   Years of education: Not on file   Highest education level: Not on file  Occupational History   Occupation: retired  Tobacco Use   Smoking status: Never   Smokeless tobacco: Never  Vaping  Use   Vaping status: Never Used  Substance and Sexual Activity   Alcohol use: No    Alcohol/week: 0.0 standard drinks of alcohol   Drug use: No   Sexual activity: Not on file  Other Topics Concern   Not on file  Social History Narrative   ** Merged History Encounter **       Social Drivers of Health   Financial Resource Strain: Not on file  Food Insecurity: No Food Insecurity (05/18/2022)   Hunger Vital Sign    Worried About Running Out of Food in the Last Year: Never true    Ran Out of Food in the Last Year: Never true  Transportation Needs: No Transportation Needs (05/18/2022)   PRAPARE - Administrator, Civil Service (Medical): No    Lack of Transportation (Non-Medical): No  Physical Activity: Not on file  Stress: No Stress Concern Present (05/18/2022)   [employer] of Occupational Health - Occupational Stress Questionnaire    Feeling of Stress : Only a little  Social Connections: Not on file  Intimate Partner Violence: Not At Risk (05/18/2022)   Humiliation, Afraid, Rape, and Kick questionnaire    Fear of Current or Ex-Partner: No    Emotionally Abused: No    Physically Abused: No    Sexually Abused: No    FAMILY HISTORY: Family History  Problem Relation Age of Onset   Heart  attack Mother    Colon cancer Neg Hx    Colon polyps Neg Hx    Esophageal cancer Neg Hx    Rectal cancer Neg Hx    Stomach cancer Neg Hx     ALLERGIES:  is allergic to tessalon [benzonatate].  MEDICATIONS:  Current Outpatient Medications  Medication Sig Dispense Refill   atorvastatin (LIPITOR) 20 MG tablet Take 20 mg by mouth.     Cholecalciferol (VITAMIN D) 2000 units CAPS Take 2,000 Units by mouth daily.     co-enzyme Q-10 30 MG capsule Take 200 mg by mouth daily.     denosumab  (PROLIA ) 60 MG/ML SOSY injection Inject 60 mg into the skin every 6 (six) months.     Multiple Vitamins-Minerals (MULTIVITAMIN WITH MINERALS) tablet Take 1 tablet by mouth.     naproxen sodium  (ALEVE) 220 MG tablet Take by mouth.     Omega-3 Fatty Acids (FISH OIL TRIPLE STRENGTH) 1400 MG CAPS Take 1 capsule by mouth daily.     omeprazole  (PRILOSEC) 20 MG capsule Take 1 capsule (20 mg total) by mouth daily. 90 capsule 4   Zinc  50 MG TABS Take 1 tablet by mouth daily.     No current facility-administered medications for this visit.    REVIEW OF SYSTEMS: .10 Point review of Systems was done is negative except as noted above.  PHYSICAL EXAMINATION:  .BP (!) 177/53 Comment: re-check  Pulse 73   Temp (!) 97 F (36.1 C)   Resp 18   Wt 112 lb (50.8 kg)   SpO2 98%   BMI 21.87 kg/m   . GENERAL:alert, in no acute distress and comfortable SKIN: no acute rashes, no significant lesions EYES: conjunctiva are pink and non-injected, sclera anicteric OROPHARYNX: MMM, no exudates, no oropharyngeal erythema or ulceration NECK: supple, no JVD LYMPH:  no palpable lymphadenopathy in the cervical, axillary or inguinal regions LUNGS: clear to auscultation b/l with normal respiratory effort HEART: regular rate & rhythm ABDOMEN:  normoactive bowel sounds , non tender, not distended. Extremity: no pedal edema PSYCH: alert & oriented x 3 with fluent speech NEURO: no focal motor/sensory deficits .  LABORATORY DATA:  I have reviewed the data as listed     Latest Ref Rng & Units 12/28/2023    9:12 AM 07/13/2023    9:00 AM 02/16/2023    8:34 AM  CBC  WBC 4.0 - 10.5 K/uL 7.0  6.8  6.5   Hemoglobin 12.0 - 15.0 g/dL 85.5  85.3  85.8   Hematocrit 36.0 - 46.0 % 44.2  45.0  42.7   Platelets 150 - 400 K/uL 208  195  238     .    Latest Ref Rng & Units 12/28/2023    9:12 AM 07/13/2023    9:00 AM 02/16/2023    8:34 AM  CMP  Glucose 70 - 99 mg/dL 94  99  99   BUN 8 - 23 mg/dL 19  20  17    Creatinine 0.44 - 1.00 mg/dL 9.11  9.09  9.09   Sodium 135 - 145 mmol/L 142  141  142   Potassium 3.5 - 5.1 mmol/L 5.1  4.1  3.9   Chloride 98 - 111 mmol/L 107  106  108   CO2 22 - 32 mmol/L 30  28  28     Calcium 8.9 - 10.3 mg/dL 9.6  9.4  9.7   Total Protein 6.5 - 8.1 g/dL 6.9  6.7  6.8   Total Bilirubin  0.0 - 1.2 mg/dL 0.4  0.4  0.5   Alkaline Phos 38 - 126 U/L 76  75  56   AST 15 - 41 U/L 24  24  22    ALT 0 - 44 U/L 18  19  17     . Lab Results  Component Value Date   LDH 160 12/28/2023         RADIOGRAPHIC STUDIES: I have personally reviewed the radiological images as listed and agreed with the findings in the report. CT Chest Wo Contrast Result Date: 12/26/2023 CLINICAL DATA:  Non-small-cell lung cancer restaging * Tracking Code: BO * EXAM: CT CHEST WITHOUT CONTRAST TECHNIQUE: Multidetector CT imaging of the chest was performed following the standard protocol without IV contrast. RADIATION DOSE REDUCTION: This exam was performed according to the departmental dose-optimization program which includes automated exposure control, adjustment of the mA and/or kV according to patient size and/or use of iterative reconstruction technique. COMPARISON:  01/25/2023 FINDINGS: Cardiovascular: Aortic atherosclerosis. Normal heart size. Right coronary artery calcifications. No pericardial effusion. Mediastinum/Nodes: No enlarged mediastinal, hilar, or axillary lymph nodes. Small hiatal hernia. Thyroid  gland, trachea, and esophagus demonstrate no significant findings. Lungs/Pleura: Unchanged postoperative/post radiation appearance of the chest, status post right middle lobectomy with bandlike fibrotic scarring of the peripheral right lung base (series 8, image 85). No pleural effusion or pneumothorax. Upper Abdomen: No acute abnormality. Musculoskeletal: No chest wall abnormality. No acute osseous findings. Dextroscoliosis of the thoracolumbar spine. IMPRESSION: 1. Unchanged postoperative/post radiation appearance of the chest, status post right middle lobectomy with bandlike fibrotic scarring of the peripheral right lung base. 2. No evidence of recurrent or metastatic disease in the chest. 3. Coronary  artery disease. Aortic Atherosclerosis (ICD10-I70.0). Electronically Signed   By: Marolyn JONETTA Jaksch M.D.   On: 12/26/2023 08:48    ASSESSMENT & PLAN:   89 y.o. 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 Patient imaging shows that this is at least a stage III A with mesenteric involvement as well. No overt evidence of bone marrow involvement but bone marrow biopsy is not planned for additional staging based on patient preference and the fact that it will not change treatment at this time . LDH level within normal limits FLIPI score - intermediate risk with 5 year OS 78% and ten-year overall survival 51% (from data in the pre-rituxan era) S/p ISRT to cervical LN  Disease. 07/08/17 PET/CT showed slightly increasing activity levels in the mesentery. 02/21/18 CT C/A/P revealed Areas of abnormal, ill-defined soft tissue in the mesentery and along the peritoneal surface identified on previous PET-CT are similar today. No new or progressive interval findings. 2.  Aortic Atherosclerois.  04/02/2019 CT C/A/P([id number]) revealed Postoperative changes of right middle lobectomy as before. No signs of new adenopathy with stable perienteric and pericolonic soft tissue. Stable lower retroperitoneal soft tissue along the common iliac vasculature. Stable mild pancreatic ductal distension, attention on follow-up Aortic Atherosclerosis (ICD10-I70.0).  #2 lower lobe pulmonary nodule mildly hypermetabolic with an SUV of 2.03.  Suspicious for lung primary s/p SBRT  PLAN: Discussed lab results from today in detail with the patient CBC is within normal limits with a hemoglobin of 14.4, WBC count of 7k and platelets of 208k CMP within normal limits LDH within normal limits at 160 Patient has no lab findings or clinical symptoms suggestive of significant follicular lymphoma progression at this time. CT chest done on  12/14/2023 showed postradiation scarring  and right middle lobectomy.  No evidence of recurrent or metastatic disease in the chest. No indication to initiate treatment of follicular lymphoma at this time.  FOLLOW UP: Return to clinic with Dr. Onesimo with labs in 6 months  The total time spent in the appointment was 30 minutes*.  All of the patient's questions were answered with apparent satisfaction. The patient knows to call the clinic with any problems, questions or concerns.   Emaline Onesimo MD MS AAHIVMS [facility] Hematology/Oncology Physician [facility]  .*Total Encounter Time as defined by the Centers for Medicare and Medicaid Services includes, in addition to the face-to-face time of a patient visit (documented in the note above) non-face-to-face time: obtaining and reviewing outside history, ordering and reviewing medications, tests or procedures, care coordination (communications with other health care professionals or caregivers) and documentation in the medical record.

## 2024-01-17 DIAGNOSIS — Z23 Encounter for immunization: Secondary | ICD-10-CM | POA: Diagnosis not present

## 2024-02-01 ENCOUNTER — Other Ambulatory Visit

## 2024-02-01 ENCOUNTER — Ambulatory Visit: Admitting: Hematology

## 2024-02-13 NOTE — Addendum Note (Signed)
 Encounter addended by: Irven Gauze E, [place] on: 02/13/2024 9:28 PM  Actions taken: Imaging Exam ended

## 2024-02-16 ENCOUNTER — Other Ambulatory Visit: Payer: Self-pay

## 2024-02-16 MED ORDER — OMEPRAZOLE 20 MG PO CPDR: 20.0000 mg | DELAYED_RELEASE_CAPSULE | Freq: Every day | ORAL | 4 refills | Status: AC

## 2024-02-16 NOTE — Progress Notes (Signed)
 Express script requested omeprazole  script

## 2024-02-24 DIAGNOSIS — M810 Age-related osteoporosis without current pathological fracture: Secondary | ICD-10-CM | POA: Diagnosis not present

## 2024-02-24 DIAGNOSIS — E7800 Pure hypercholesterolemia, unspecified: Secondary | ICD-10-CM | POA: Diagnosis not present

## 2024-02-24 DIAGNOSIS — E039 Hypothyroidism, unspecified: Secondary | ICD-10-CM | POA: Diagnosis not present

## 2024-02-29 DIAGNOSIS — E7800 Pure hypercholesterolemia, unspecified: Secondary | ICD-10-CM | POA: Diagnosis not present

## 2024-02-29 DIAGNOSIS — Z0000 Encounter for general adult medical examination without abnormal findings: Secondary | ICD-10-CM | POA: Diagnosis not present

## 2024-02-29 DIAGNOSIS — M810 Age-related osteoporosis without current pathological fracture: Secondary | ICD-10-CM | POA: Diagnosis not present

## 2024-02-29 DIAGNOSIS — E041 Nontoxic single thyroid nodule: Secondary | ICD-10-CM | POA: Diagnosis not present

## 2024-02-29 DIAGNOSIS — R911 Solitary pulmonary nodule: Secondary | ICD-10-CM | POA: Diagnosis not present

## 2024-02-29 DIAGNOSIS — R0609 Other forms of dyspnea: Secondary | ICD-10-CM | POA: Diagnosis not present

## 2024-02-29 DIAGNOSIS — C8203 Follicular lymphoma grade I, intra-abdominal lymph nodes: Secondary | ICD-10-CM | POA: Diagnosis not present

## 2024-02-29 DIAGNOSIS — R600 Localized edema: Secondary | ICD-10-CM | POA: Diagnosis not present

## 2024-02-29 DIAGNOSIS — I341 Nonrheumatic mitral (valve) prolapse: Secondary | ICD-10-CM | POA: Diagnosis not present

## 2024-02-29 DIAGNOSIS — C8209 Follicular lymphoma grade I, extranodal and solid organ sites: Secondary | ICD-10-CM | POA: Diagnosis not present

## 2024-02-29 DIAGNOSIS — M545 Low back pain, unspecified: Secondary | ICD-10-CM | POA: Diagnosis not present

## 2024-02-29 DIAGNOSIS — I700 Atherosclerosis of aorta: Secondary | ICD-10-CM | POA: Diagnosis not present

## 2024-06-27 ENCOUNTER — Other Ambulatory Visit

## 2024-06-27 ENCOUNTER — Ambulatory Visit: Admitting: Hematology
